# Patient Record
Sex: Male | Born: 1965 | Race: Black or African American | Hispanic: No | Marital: Married | Smoking: Never smoker
Health system: Southern US, Community
[De-identification: ages and names within clinical notes are randomized; demographics above are authoritative.]

## PROBLEM LIST (undated history)

## (undated) DIAGNOSIS — F419 Anxiety disorder, unspecified: Secondary | ICD-10-CM

## (undated) DIAGNOSIS — I10 Essential (primary) hypertension: Secondary | ICD-10-CM

## (undated) DIAGNOSIS — M1990 Unspecified osteoarthritis, unspecified site: Secondary | ICD-10-CM

## (undated) HISTORY — DX: Unspecified osteoarthritis, unspecified site: M19.90

## (undated) HISTORY — DX: Anxiety disorder, unspecified: F41.9

## (undated) HISTORY — DX: Essential (primary) hypertension: I10

---

## 2011-12-12 ENCOUNTER — Ambulatory Visit (INDEPENDENT_AMBULATORY_CARE_PROVIDER_SITE_OTHER): Payer: BC Managed Care – PPO | Admitting: Family Medicine

## 2011-12-12 VITALS — BP 184/98 | HR 91 | Temp 98.7°F | Resp 20 | Ht 69.75 in | Wt 200.4 lb

## 2011-12-12 DIAGNOSIS — R51 Headache: Secondary | ICD-10-CM

## 2011-12-12 DIAGNOSIS — F419 Anxiety disorder, unspecified: Secondary | ICD-10-CM | POA: Insufficient documentation

## 2011-12-12 DIAGNOSIS — F411 Generalized anxiety disorder: Secondary | ICD-10-CM

## 2011-12-12 DIAGNOSIS — G4726 Circadian rhythm sleep disorder, shift work type: Secondary | ICD-10-CM

## 2011-12-12 DIAGNOSIS — Z23 Encounter for immunization: Secondary | ICD-10-CM

## 2011-12-12 DIAGNOSIS — Z79899 Other long term (current) drug therapy: Secondary | ICD-10-CM

## 2011-12-12 DIAGNOSIS — I10 Essential (primary) hypertension: Secondary | ICD-10-CM

## 2011-12-12 DIAGNOSIS — R519 Headache, unspecified: Secondary | ICD-10-CM | POA: Insufficient documentation

## 2011-12-12 LAB — COMPREHENSIVE METABOLIC PANEL
ALT: 22 U/L (ref 0–53)
AST: 19 U/L (ref 0–37)
Alkaline Phosphatase: 57 U/L (ref 39–117)
Potassium: 4.2 mEq/L (ref 3.5–5.3)
Sodium: 141 mEq/L (ref 135–145)
Total Bilirubin: 0.4 mg/dL (ref 0.3–1.2)
Total Protein: 7.2 g/dL (ref 6.0–8.3)

## 2011-12-12 MED ORDER — PROPRANOLOL HCL ER 80 MG PO CP24: 80.0000 mg | ORAL_CAPSULE | Freq: Every day | ORAL | Status: DC

## 2011-12-12 MED ORDER — TRAZODONE HCL 50 MG PO TABS: 50.0000 mg | ORAL_TABLET | Freq: Every evening | ORAL | Status: DC | PRN

## 2011-12-12 MED ORDER — LISINOPRIL-HYDROCHLOROTHIAZIDE 20-25 MG PO TABS: 1.0000 | ORAL_TABLET | Freq: Every day | ORAL | Status: DC

## 2011-12-12 NOTE — Progress Notes (Signed)
  Subjective:    Patient ID: Kevin Jarvis, male    DOB: May 04, 1965, 46 y.o.   MRN: [id number]  HPI presents today with HTN. BP reading today 184/98. Dx 4-5 years ago. HTN is causing mild headaches on top of head. Has been using OTC medications but that doesn't help. Was on Lisinopril-HCTZ but just got tired of taking medication so stopped over a yr ago and didn't feel his bp was ever controlled on it. Repeat manual BP reading 168/92 in office today. Denies CP, SOB, or edema. Had eye exam earlier this year and wears reading glasses.  Works third shift and having trouble sleeping during the day.   Anxiety at times. Not depressed - mood good and really doesn't have anything to worry about - things going well in his life.    Review of Systems  Constitutional: Positive for diaphoresis and fatigue. Negative for fever, chills and appetite change.  Eyes: Negative for visual disturbance.  Respiratory: Negative for chest tightness and shortness of breath.   Cardiovascular: Negative for chest pain and leg swelling.  Neurological: Positive for headaches.  Psychiatric/Behavioral: Positive for disturbed wake/sleep cycle. Negative for behavioral problems and dysphoric mood. The patient is nervous/anxious.        Objective:   Physical Exam  Constitutional: He is oriented to person, place, and time. He appears well-developed and well-nourished.  HENT:  Head: Normocephalic and atraumatic.  Eyes: Pupils are equal, round, and reactive to light. No scleral icterus.  Neck: Normal range of motion. Neck supple. No thyromegaly present.  Cardiovascular: Normal rate, regular rhythm, normal heart sounds and intact distal pulses.   Pulmonary/Chest: Effort normal and breath sounds normal. No respiratory distress.  Neurological: He is alert and oriented to person, place, and time.  Skin: Skin is warm and dry.  Psychiatric: He has a normal mood and affect. His behavior is normal.          Assessment & Plan:    1. HTN - restart lisinopril/hctz 20/25. Start propranolol since hopefully this will help w/ anxiety as well. Recheck in 1-2 wks w/ repeat bmp. Check cmp today since starting meds. Needs flp at f/u. 2. HAs - fine to try prn anti-inflammatories but hopefully will go away once bp treated. Increase h20. Check tsh 3. Insomnia - trial of prn trazodone. Consider re-trial of ambien if unsuccessful. 4. HM - flu shot today

## 2011-12-12 NOTE — Patient Instructions (Addendum)
Take the trazodone as needed for sleep. Consider augmenting or replacing with otc melatonin 3 mg if needed for sleep as well.  Return to see me in 1-2 wks to recheck your blood pressure and your blood work. Do not eat before you visit though you may drink plenty of water and do take all of your medicines that day.Hypertension As your heart beats, it forces blood through your arteries. This force is your blood pressure. If the pressure is too high, it is called hypertension (HTN) or high blood pressure. HTN is dangerous because you may have it and not know it. High blood pressure may mean that your heart has to work harder to pump blood. Your arteries may be narrow or stiff. The extra work puts you at risk for heart disease, stroke, and other problems.  Blood pressure consists of two numbers, a higher number over a lower, 110/72, for example. It is stated as "110 over 72." The ideal is below 120 for the top number (systolic) and under 80 for the bottom (diastolic). Write down your blood pressure today. You should pay close attention to your blood pressure if you have certain conditions such as:  Heart failure.   Prior heart attack.   Diabetes   Chronic kidney disease.   Prior stroke.   Multiple risk factors for heart disease.  To see if you have HTN, your blood pressure should be measured while you are seated with your arm held at the level of the heart. It should be measured at least twice. A one-time elevated blood pressure reading (especially in the Emergency Department) does not mean that you need treatment. There may be conditions in which the blood pressure is different between your right and left arms. It is important to see your caregiver soon for a recheck. Most people have essential hypertension which means that there is not a specific cause. This type of high blood pressure may be lowered by changing lifestyle factors such as:  Stress.   Smoking.   Lack of exercise.   Excessive  weight.   Drug/tobacco/alcohol use.   Eating less salt.  Most people do not have symptoms from high blood pressure until it has caused damage to the body. Effective treatment can often prevent, delay or reduce that damage. TREATMENT  When a cause has been identified, treatment for high blood pressure is directed at the cause. There are a large number of medications to treat HTN. These fall into several categories, and your caregiver will help you select the medicines that are best for you. Medications may have side effects. You should review side effects with your caregiver. If your blood pressure stays high after you have made lifestyle changes or started on medicines,   Your medication(s) may need to be changed.   Other problems may need to be addressed.   Be certain you understand your prescriptions, and know how and when to take your medicine.   Be sure to follow up with your caregiver within the time frame advised (usually within two weeks) to have your blood pressure rechecked and to review your medications.   If you are taking more than one medicine to lower your blood pressure, make sure you know how and at what times they should be taken. Taking two medicines at the same time can result in blood pressure that is too low.  SEEK IMMEDIATE MEDICAL CARE IF:  You develop a severe headache, blurred or changing vision, or confusion.   You have unusual  weakness or numbness, or a faint feeling.   You have severe chest or abdominal pain, vomiting, or breathing problems.  MAKE SURE YOU:   Understand these instructions.   Will watch your condition.   Will get help right away if you are not doing well or get worse.  Document Released: 03/18/2005 Document Revised: 03/07/2011 Document Reviewed: 11/06/2007 [facility] Patient Information 2012 [place], [place].

## 2011-12-12 NOTE — Progress Notes (Signed)
Reviewed and agree.

## 2011-12-17 ENCOUNTER — Other Ambulatory Visit: Payer: Self-pay | Admitting: Family Medicine

## 2011-12-17 DIAGNOSIS — R946 Abnormal results of thyroid function studies: Secondary | ICD-10-CM

## 2011-12-23 ENCOUNTER — Ambulatory Visit (INDEPENDENT_AMBULATORY_CARE_PROVIDER_SITE_OTHER): Payer: BC Managed Care – PPO | Admitting: Family Medicine

## 2011-12-23 VITALS — BP 125/80 | HR 63 | Temp 98.3°F | Resp 18 | Ht 70.0 in | Wt 203.0 lb

## 2011-12-23 DIAGNOSIS — G4700 Insomnia, unspecified: Secondary | ICD-10-CM

## 2011-12-23 DIAGNOSIS — F419 Anxiety disorder, unspecified: Secondary | ICD-10-CM

## 2011-12-23 DIAGNOSIS — I10 Essential (primary) hypertension: Secondary | ICD-10-CM

## 2011-12-23 DIAGNOSIS — F411 Generalized anxiety disorder: Secondary | ICD-10-CM

## 2011-12-23 LAB — LIPID PANEL: Total CHOL/HDL Ratio: 2.8 Ratio

## 2011-12-23 LAB — BASIC METABOLIC PANEL
BUN: 13 mg/dL (ref 6–23)
CO2: 28 mEq/L (ref 19–32)
Calcium: 9.7 mg/dL (ref 8.4–10.5)
Creat: 0.93 mg/dL (ref 0.50–1.35)
Glucose, Bld: 102 mg/dL — ABNORMAL HIGH (ref 70–99)
Sodium: 138 mEq/L (ref 135–145)

## 2011-12-23 LAB — TSH: TSH: 0.449 u[IU]/mL (ref 0.350–4.500)

## 2011-12-23 NOTE — Progress Notes (Signed)
[facility] [address] [place] [address] [phone]  Date:  12/23/2011   Name:  Kevin Jarvis   DOB:  1965/06/11   MRN:  [id number]  PCP:  No primary provider on file.    Chief Complaint: Follow-up and insomnia   History of Present Illness:  Kevin Jarvis is a 46 y.o. very pleasant male patient who presents with the following:  He is here today for a recheck of his HTN, anxiety, and insomnia.  See last OV about 10 days ago.  He was started back on anti- hypertensive medications then.   He feels that his job is more stressful- after a shift at work he has a hard time sleeping, but when he has not been at work he sleeps well.  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    Kyshaun would like a note to return to being a Location manager.    He is fasting today for a cholesterol panel- he last ate at 11 pm   He does not feel that the trazadone is very helpful for sleeping, but he thinks the problem is more his current job.    He was NOT fasting at his last lab draw.  He had a very slightly low TSH- will recheck this today.   Patient Active Problem List  Diagnosis  . Anxiety  . Long term use of drug  . Hypertension  . Sleep disorder, shift work  . Headache    No past medical history on file.  No past surgical history on file.  History  Substance Use Topics  . Smoking status: Never Smoker   . Smokeless tobacco: Not on file  . Alcohol Use: Not on file    No family history on file.  No Known Allergies  Medication list has been reviewed and updated.  Current Outpatient Prescriptions on File Prior to Visit  Medication Sig Dispense Refill  . lisinopril-hydrochlorothiazide (PRINZIDE,ZESTORETIC) 20-25 MG per tablet Take 1 tablet by mouth daily.  30 tablet  1  . propranolol ER (INDERAL LA) 80 MG 24 hr  capsule Take 1 capsule (80 mg total) by mouth daily.  30 capsule  1  . traZODone (DESYREL) 50 MG tablet Take 1 tablet (50 mg total) by mouth at bedtime as needed for sleep.  90 tablet  1    Review of Systems:  As per HPI- otherwise negative.   Physical Examination: Filed Vitals:   12/23/11 0806  BP: 152/96  Pulse: 63  Temp: 98.3 F (36.8 C)  Resp: 18   Filed Vitals:   12/23/11 0806  Height: 5\' 10"  (1.778 m)  Weight: 203 lb (92.08 kg)   Body mass index is 29.13 kg/(m^2). Ideal Body Weight: Weight in (lb) to have BMI = 25: 173.9   GEN: WDWN, NAD, Non-toxic, A & O x 3 HEENT: Atraumatic, Normocephalic. Neck supple. No masses, No LAD. Ears and Nose: No external deformity. CV: RRR, No M/G/R. No JVD. No thrill. No extra heart sounds. PULM: CTA B, no wheezes, crackles, rhonchi. No retractions. No resp. distress. No accessory muscle use. ABD: S, NT, ND, +BS. No rebound. No HSM. EXTR: No c/c/e NEURO Normal gait.  PSYCH: Normally interactive. Conversant. Not depressed or anxious appearing.  Calm demeanor.   See recheck BP- 125/ 80!   Assessment and Plan:  1. Hypertension  Basic metabolic panel, Lipid panel, TSH  2. Anxiety  TSH  3. Insomnia     HTN- controlled!  Check BMP today, but plan to continue current medications. 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  He felt comfortable nad confident in that position.  I did a letter for him today Recheck TSH as it was slightly low last time.  Check FLP today as he is fasting  COPLAND,JESSICA, MD

## 2011-12-24 ENCOUNTER — Encounter: Payer: Self-pay | Admitting: Family Medicine

## 2012-02-20 ENCOUNTER — Other Ambulatory Visit: Payer: Self-pay | Admitting: *Deleted

## 2012-02-20 MED ORDER — PROPRANOLOL HCL ER 80 MG PO CP24: 80.0000 mg | ORAL_CAPSULE | Freq: Every day | ORAL | Status: DC

## 2012-04-25 ENCOUNTER — Ambulatory Visit (INDEPENDENT_AMBULATORY_CARE_PROVIDER_SITE_OTHER): Payer: BC Managed Care – PPO | Admitting: Family Medicine

## 2012-04-25 VITALS — BP 180/110 | HR 81 | Temp 98.4°F | Resp 18 | Wt 205.0 lb

## 2012-04-25 DIAGNOSIS — I10 Essential (primary) hypertension: Secondary | ICD-10-CM

## 2012-04-25 DIAGNOSIS — R51 Headache: Secondary | ICD-10-CM

## 2012-04-25 DIAGNOSIS — R251 Tremor, unspecified: Secondary | ICD-10-CM

## 2012-04-25 DIAGNOSIS — R259 Unspecified abnormal involuntary movements: Secondary | ICD-10-CM

## 2012-04-25 MED ORDER — KETOROLAC TROMETHAMINE 60 MG/2ML IM SOLN
60.0000 mg | Freq: Once | INTRAMUSCULAR | Status: AC
  Administered 2012-04-25: 60 mg via INTRAMUSCULAR

## 2012-04-25 MED ORDER — BUTALBITAL-APAP-CAFFEINE 50-325-40 MG PO TABS: 1.0000 | ORAL_TABLET | ORAL | Status: AC | PRN

## 2012-04-25 MED ORDER — LISINOPRIL-HYDROCHLOROTHIAZIDE 20-25 MG PO TABS: 1.0000 | ORAL_TABLET | Freq: Every day | ORAL | Status: DC

## 2012-04-25 MED ORDER — PROPRANOLOL HCL ER 80 MG PO CP24: 80.0000 mg | ORAL_CAPSULE | Freq: Every day | ORAL | Status: DC

## 2012-04-25 MED ORDER — CLONIDINE HCL 0.1 MG PO TABS
0.1000 mg | ORAL_TABLET | Freq: Once | ORAL | Status: AC
  Administered 2012-04-25: 0.1 mg via ORAL

## 2012-04-29 ENCOUNTER — Telehealth: Payer: Self-pay

## 2012-04-29 NOTE — Telephone Encounter (Signed)
Spoke with pt. Notified of labs and says his head is much better

## 2012-05-03 NOTE — Progress Notes (Signed)
Subjective:    Patient ID: Kevin Jarvis, male    DOB: October 06, 1965, 47 y.o.   MRN: [id number] Chief Complaint  Patient presents with  . Headache    hypertension    HPI  He ran out of his BP medication about 2 wks ago and since then has been developing a progressively worse HA. He has not taken anything for his HA today but usually ibuprofen and tylenol help some - just not all the way. The HA is at the top of his head. He often gets this when he runs out of his BP meds.  He is planning a 3 mo trip out of the country around April but will make sure to RTC before then.  Review of Systems  Constitutional: Negative for fever and chills.  Eyes: Negative for visual disturbance.  Respiratory: Negative for shortness of breath.   Cardiovascular: Negative for chest pain and leg swelling.  Neurological: Positive for headaches. Negative for dizziness, syncope, facial asymmetry, weakness and light-headedness.      BP 180/110  Pulse 81  Temp 98.4 F (36.9 C) (Oral)  Resp 18  Wt 205 lb (92.987 kg) Objective:   Physical Exam  Constitutional: He is oriented to person, place, and time. He appears well-developed and well-nourished. No distress.  HENT:  Head: Normocephalic and atraumatic.  Right Ear: Tympanic membrane, external ear and ear canal normal.  Left Ear: Tympanic membrane, external ear and ear canal normal.  Nose: Nose normal.  Mouth/Throat: Uvula is midline, oropharynx is clear and moist and mucous membranes are normal. No oropharyngeal exudate.  Eyes: Conjunctivae normal and EOM are normal. Pupils are equal, round, and reactive to light. No scleral icterus.  Neck: Normal range of motion. Neck supple. No thyromegaly present.  Pulmonary/Chest: Effort normal.  Lymphadenopathy:    He has no cervical adenopathy.  Neurological: He is alert and oriented to person, place, and time. He displays normal reflexes. No cranial nerve deficit or sensory deficit. He exhibits normal muscle tone.  Coordination and gait normal.  Skin: Skin is warm and dry. He is not diaphoretic.  Psychiatric: He has a normal mood and affect. His behavior is normal.          Assessment & Plan:   1. Hypertension - given clonidine 0.1mg  po x 1 in office.  Restart lisinopril-hctz and propranolol as was prev under excellent control. Pt promises he will f/u in 3 mos BEFORE he runs out of the medication. lisinopril-hydrochlorothiazide (PRINZIDE,ZESTORETIC) 20-25 MG per tablet, propranolol ER (INDERAL LA) 80 MG 24 hr capsule, cloNIDine (CATAPRES) tablet 0.1 mg  2. Headache - treated with toradol 60mg  IM x 1 in office. Suspect will go away once he gets restarted on BP meds but may also have a component of tension so ok to try a few fioricet while restarting on bp meds. ketorolac (TORADOL) injection 60 mg, butalbital-acetaminophen-caffeine (FIORICET) 50-325-40 MG per tablet  3. Tremor - restart propranolol TSH   Meds ordered this encounter  Medications  . lisinopril-hydrochlorothiazide (PRINZIDE,ZESTORETIC) 20-25 MG per tablet    Sig: Take 1 tablet by mouth daily.    Dispense:  30 tablet    Refill:  2  . propranolol ER (INDERAL LA) 80 MG 24 hr capsule    Sig: Take 1 capsule (80 mg total) by mouth daily.    Dispense:  30 capsule    Refill:  2  . cloNIDine (CATAPRES) tablet 0.1 mg    Sig:   . ketorolac (TORADOL) injection 60 mg  Sig:   . butalbital-acetaminophen-caffeine (FIORICET) 50-325-40 MG per tablet    Sig: Take 1 tablet by mouth every 4 (four) hours as needed for headache.    Dispense:  10 tablet    Refill:  0

## 2012-07-01 ENCOUNTER — Ambulatory Visit (INDEPENDENT_AMBULATORY_CARE_PROVIDER_SITE_OTHER): Payer: BC Managed Care – PPO | Admitting: Family Medicine

## 2012-07-01 VITALS — BP 178/90 | HR 70 | Temp 97.7°F | Resp 16 | Ht 69.5 in | Wt 205.0 lb

## 2012-07-01 DIAGNOSIS — Z7184 Encounter for health counseling related to travel: Secondary | ICD-10-CM

## 2012-07-01 DIAGNOSIS — G4726 Circadian rhythm sleep disorder, shift work type: Secondary | ICD-10-CM

## 2012-07-01 DIAGNOSIS — I10 Essential (primary) hypertension: Secondary | ICD-10-CM

## 2012-07-01 DIAGNOSIS — Z7189 Other specified counseling: Secondary | ICD-10-CM

## 2012-07-01 DIAGNOSIS — Z79899 Other long term (current) drug therapy: Secondary | ICD-10-CM

## 2012-07-01 MED ORDER — ATOVAQUONE-PROGUANIL HCL 250-100 MG PO TABS: 1.0000 | ORAL_TABLET | Freq: Every day | ORAL | Status: DC

## 2012-07-01 MED ORDER — PROPRANOLOL HCL ER 80 MG PO CP24: 80.0000 mg | ORAL_CAPSULE | Freq: Every day | ORAL | Status: DC

## 2012-07-01 MED ORDER — LISINOPRIL-HYDROCHLOROTHIAZIDE 20-25 MG PO TABS: 1.0000 | ORAL_TABLET | Freq: Every day | ORAL | Status: DC

## 2012-07-01 NOTE — Progress Notes (Signed)
  Subjective:    Patient ID: Kevin Jarvis, male    DOB: 10-22-1965, 47 y.o.   MRN: [id number] Chief Complaint  Patient presents with  . Medication Refill    HPI   Works 3rd shift and works constantly. - Usually 6 nights a wk. Has been taking his blood pressure pills every morning after he gets home from work, before bed.  He has not had any time to check his BP outside of the office. He ran out of BP pills yest so has not had any in >24 hrs.  He does not have a headache.  In 3d, he is leaving to visit family in [place] whom he has not seen in 2 yrs. He plans to stay about 3 months.   Review of Systems  Constitutional: Positive for fatigue. Negative for fever, chills, activity change and appetite change.  Eyes: Negative for visual disturbance.  Respiratory: Negative for shortness of breath.   Cardiovascular: Negative for chest pain and leg swelling.  Neurological: Negative for dizziness, syncope, facial asymmetry, weakness, light-headedness and headaches.  Psychiatric/Behavioral: Positive for sleep disturbance.      BP 178/90  Pulse 70  Temp(Src) 97.7 F (36.5 C) (Oral)  Resp 16  Ht 5' 9.5" (1.765 m)  Wt 205 lb (92.987 kg)  BMI 29.85 kg/m2  SpO2 99% Objective:   Physical Exam  Constitutional: He is oriented to person, place, and time. He appears well-developed and well-nourished. No distress.  HENT:  Head: Normocephalic and atraumatic.  Eyes: Conjunctivae are normal. Pupils are equal, round, and reactive to light. No scleral icterus.  Neck: Normal range of motion. Neck supple. No thyromegaly present.  Cardiovascular: Normal rate, regular rhythm, normal heart sounds and intact distal pulses.   Pulmonary/Chest: Effort normal and breath sounds normal. No respiratory distress.  Musculoskeletal: He exhibits no edema.  Lymphadenopathy:    He has no cervical adenopathy.  Neurological: He is alert and oriented to person, place, and time.  Skin: Skin is warm and dry. He is not  diaphoretic.  Psychiatric: He has a normal mood and affect. His behavior is normal.      Assessment & Plan:  Long term use of drug - bmp at f/u  Hypertension - Plan: propranolol ER (INDERAL LA) 80 MG 24 hr capsule, lisinopril-hydrochlorothiazide (PRINZIDE,ZESTORETIC) 20-25 MG per tablet - restart. Plenty given so that pt can be ON his medicine when he returns to clinic next.  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  Meds ordered this encounter  Medications  . propranolol ER (INDERAL LA) 80 MG 24 hr capsule    Sig: Take 1 capsule (80 mg total) by mouth daily.    Dispense:  180 capsule    Refill:  0    Please dispense a 6 mo supply. Pt is leaving the country.  Marland Kitchen lisinopril-hydrochlorothiazide (PRINZIDE,ZESTORETIC) 20-25 MG per tablet    Sig: Take 1 tablet by mouth daily.    Dispense:  180 tablet    Refill:  0    Please dispense 6 mo supply. Pt is leaving the country  . atovaquone-proguanil (MALARONE) 250-100 MG TABS    Sig: Take 1 tablet by mouth daily. Start  07/02/12 and d/c 7d after returning    Dispense:  120 tablet    Refill:  0

## 2013-08-05 ENCOUNTER — Telehealth: Payer: Self-pay | Admitting: Family Medicine

## 2013-08-05 ENCOUNTER — Other Ambulatory Visit: Payer: Self-pay | Admitting: Family Medicine

## 2013-08-05 ENCOUNTER — Ambulatory Visit (INDEPENDENT_AMBULATORY_CARE_PROVIDER_SITE_OTHER): Payer: 59 | Admitting: Family Medicine

## 2013-08-05 VITALS — BP 162/80 | HR 85 | Temp 98.3°F | Resp 18 | Ht 68.5 in | Wt 202.0 lb

## 2013-08-05 DIAGNOSIS — N529 Male erectile dysfunction, unspecified: Secondary | ICD-10-CM

## 2013-08-05 DIAGNOSIS — N50819 Testicular pain, unspecified: Secondary | ICD-10-CM

## 2013-08-05 DIAGNOSIS — Z0000 Encounter for general adult medical examination without abnormal findings: Secondary | ICD-10-CM

## 2013-08-05 DIAGNOSIS — Z23 Encounter for immunization: Secondary | ICD-10-CM

## 2013-08-05 DIAGNOSIS — F411 Generalized anxiety disorder: Secondary | ICD-10-CM

## 2013-08-05 DIAGNOSIS — I10 Essential (primary) hypertension: Secondary | ICD-10-CM

## 2013-08-05 DIAGNOSIS — N509 Disorder of male genital organs, unspecified: Secondary | ICD-10-CM

## 2013-08-05 MED ORDER — TRAZODONE HCL 50 MG PO TABS: 25.0000 mg | ORAL_TABLET | Freq: Every evening | ORAL | Status: DC | PRN

## 2013-08-05 MED ORDER — SILDENAFIL CITRATE 100 MG PO TABS: 50.0000 mg | ORAL_TABLET | Freq: Every day | ORAL | Status: DC | PRN

## 2013-08-05 MED ORDER — PROPRANOLOL HCL ER 80 MG PO CP24: 80.0000 mg | ORAL_CAPSULE | Freq: Every day | ORAL | Status: DC

## 2013-08-05 MED ORDER — LISINOPRIL-HYDROCHLOROTHIAZIDE 20-25 MG PO TABS: 1.0000 | ORAL_TABLET | Freq: Every day | ORAL | Status: DC

## 2013-08-05 NOTE — Progress Notes (Addendum)
Subjective:    Patient ID: Kevin Jarvis, male    DOB: Aug 23, 1965, 48 y.o.   MRN: [id number]  Chief Complaint  Patient presents with  . Annual Exam   This chart was scribed for [name], MD by Erling Conte, Medical Scribe. This patient was seen in room Room 4 and the patient's care was started at 10:48 AM   HPI  HPI Comments: Kevin Jarvis is a 48 y.o. male who presents [facility] for his [facility] exam.    Patient states that he needs refill for his anxiety medication.  He was on propranolol  Patient also reports that he is on medication for his HTN that he got in [place] when he was there on a visit for 2-3 wks. Med name or type unknown. Patient denies any chest pain, dizziness, dyspnea.   He also got married his recent trip back home to [place].  Patient reports that he is having some mild erectile dysfunction that he would like treated before his wife moves here from [place] so no current need for viagra. He also has some bilateral testicular pain.   Patient does not believe that he has had his T-dap shot in the last 10 years.   He asked for a medication to help with his sleep, due to his shift work. Tried trazodone previously.  Past Medical History  Diagnosis Date  . Arthritis   . Anxiety   . Hypertension    Current Outpatient Prescriptions on File Prior to Visit  Medication Sig Dispense Refill  . atovaquone-proguanil (MALARONE) 250-100 MG TABS Take 1 tablet by mouth daily. Start  07/02/12 and d/c 7d after returning  120 tablet  0  . lisinopril-hydrochlorothiazide (PRINZIDE,ZESTORETIC) 20-25 MG per tablet Take 1 tablet by mouth daily.  180 tablet  0  . propranolol ER (INDERAL LA) 80 MG 24 hr capsule Take 1 capsule (80 mg total) by mouth daily.  180 capsule  0   No current facility-administered medications on file prior to visit.   No Known Allergies  Review of Systems  Respiratory: Negative for shortness of breath.   Cardiovascular: Negative  for chest pain.  Genitourinary: Positive for discharge.       Mild erectile dysfunction  Neurological: Negative for dizziness.  All other systems reviewed and are negative.     BP 162/80  Pulse 85  Temp(Src) 98.3 F (36.8 C) (Oral)  Resp 18  Ht 5' 8.5" (1.74 m)  Wt 202 lb (91.627 kg)  BMI 30.26 kg/m2  SpO2 99% Objective:   Physical Exam  Nursing note and vitals reviewed. Constitutional: He is oriented to person, place, and time. He appears well-developed and well-nourished. No distress.  HENT:  Head: Normocephalic and atraumatic.  Right Ear: Tympanic membrane, external ear and ear canal normal.  Left Ear: Tympanic membrane, external ear and ear canal normal.  Nose: Nose normal.  Mouth/Throat: Uvula is midline, oropharynx is clear and moist and mucous membranes are normal. No oropharyngeal exudate.  Eyes: Conjunctivae and EOM are normal. Right eye exhibits no discharge. Left eye exhibits no discharge. No scleral icterus.  Neck: Normal range of motion. Neck supple. No tracheal deviation present. No thyromegaly present.  Cardiovascular: Normal rate, regular rhythm, normal heart sounds and intact distal pulses.  Exam reveals no friction rub.   No murmur heard. Pulses:      Dorsalis pedis pulses are 2+ on the right side, and 2+ on the left side.  Posterior tibial pulses are 2+ on the right side, and 2+ on the left side.  Pulmonary/Chest: Effort normal and breath sounds normal. No respiratory distress.  Abdominal: Soft. Bowel sounds are normal. He exhibits no distension and no mass. There is no hepatosplenomegaly. There is no tenderness. There is no rebound and no guarding.  Genitourinary: Rectum normal, prostate normal and penis normal. Right testis shows tenderness. Right testis shows no mass and no swelling. Left testis shows tenderness. Uncircumcised. No penile erythema. No discharge found.  Musculoskeletal: Normal range of motion. He exhibits no edema.  1+ pitting edema  bilaterally with some shiny hairless skin on anterior lower tibia  Lymphadenopathy:    He has no cervical adenopathy.       Right: No supraclavicular adenopathy present.       Left: No supraclavicular adenopathy present.  Neurological: He is alert and oriented to person, place, and time. He has normal reflexes. No cranial nerve deficit. He exhibits normal muscle tone.  Skin: Skin is warm and dry. No rash noted. He is not diaphoretic. No erythema.  Psychiatric: He has a normal mood and affect. His behavior is normal.     [facility] reading (PRIMARY) by Dr. Brigitte Pulse EKG: T wave inversion V4-V6, increased R wave in lead 3 and AVF    Assessment & Plan:  Routine general medical examination at a health care facility - Plan: EKG 12-Lead, HIV antibody, RPR, GC/Chlamydia Probe Amp, Hepatitis C antibody, Lipid panel, PSA, CBC with Differential, COMPLETE METABOLIC PANEL WITH GFR, TSH - Encounter limited by language barrier  Essential hypertension, benign - Plan: EKG 12-Lead, CBC with Differential, COMPLETE METABOLIC PANEL WITH GFR - restart lisinopril-hctz and propranolol. Recheck in 2 mos.  Erectile dysfunction - Plan: US Scrotum - unclear but pt does not desire treatment now - his wife is still in [place] and will not be joining him until much later in the year. Viagra coupon given to pt to use at that time.  Testicular pain - Plan: US Scrotum  Anxiety state, unspecified and shift-work sleep d/o - refilled propranolol trazodone per pt request.  Hypertension - Plan: lisinopril-hydrochlorothiazide (PRINZIDE,ZESTORETIC) 20-25 MG per tablet, propranolol ER (INDERAL LA) 80 MG 24 hr capsule  Need for Tdap vaccination - Plan: Tdap vaccine greater than or equal to 7yo IM  Meds ordered this encounter  Medications  . lisinopril-hydrochlorothiazide (PRINZIDE,ZESTORETIC) 20-25 MG per tablet    Sig: Take 1 tablet by mouth daily.    Dispense:  90 tablet    Refill:  0  . propranolol ER (INDERAL LA) 80 MG 24 hr  capsule    Sig: Take 1 capsule (80 mg total) by mouth daily.    Dispense:  90 capsule    Refill:  0  . DISCONTD: sildenafil (VIAGRA) 100 MG tablet    Sig: Take 0.5 tablets (50 mg total) by mouth daily as needed for erectile dysfunction.    Dispense:  5 tablet    Refill:  11  . traZODone (DESYREL) 50 MG tablet    Sig: Take 0.5-1 tablets (25-50 mg total) by mouth at bedtime as needed for sleep.    Dispense:  30 tablet    Refill:  3    I personally performed the services described in this documentation, which was scribed in my presence. The recorded information has been reviewed and considered, and addended by me as needed.  [name], MD MPH

## 2013-08-05 NOTE — Patient Instructions (Addendum)
Keeping you healthy  Get these tests  Blood pressure- Have your blood pressure checked once a year by your healthcare provider.  Normal blood pressure is 120/80  Weight- Have your body mass index (BMI) calculated to screen for obesity.  BMI is a measure of body fat based on height and weight. You can also calculate your own BMI at [email or web address].  Cholesterol- Have your cholesterol checked every year.  Diabetes- Have your blood sugar checked regularly if you have high blood pressure, high cholesterol, have a family history of diabetes or if you are overweight.  Screening for Colon Cancer- Colonoscopy starting at age 15.  Screening may begin sooner depending on your family history and other health conditions. Follow up colonoscopy as directed by your Gastroenterologist.  Screening for Prostate Cancer- Both blood work (PSA) and a rectal exam help screen for Prostate Cancer.  Screening begins at age 60 with African-American men and at age 61 with Caucasian men.  Screening may begin sooner depending on your family history.  Take these medicines  Aspirin- One aspirin daily can help prevent Heart disease and Stroke.  Flu shot- Every fall.  Tetanus- Every 10 years.  Zostavax- Once after the age of 9 to prevent Shingles.  Pneumonia shot- Once after the age of 79; if you are younger than 70, ask your healthcare provider if you need a Pneumonia shot.  Take these steps  Don't smoke- If you do smoke, talk to your doctor about quitting.  For tips on how to quit, go to www.smokefree.gov or call 1-800-QUIT-NOW.  Be physically active- Exercise 5 days a week for at least 30 minutes.  If you are not already physically active start slow and gradually work up to 30 minutes of moderate physical activity.  Examples of moderate activity include walking briskly, mowing the yard, dancing, swimming, bicycling, etc.  Eat a healthy diet- Eat a variety of healthy food such as fruits, vegetables, low  fat milk, low fat cheese, yogurt, lean meant, poultry, fish, beans, tofu, etc. For more information go to www.thenutritionsource.org  Drink alcohol in moderation- Limit alcohol intake to less than two drinks a day. Never drink and drive.  Dentist- Brush and floss twice daily; visit your dentist twice a year.  Depression- Your emotional health is as important as your physical health. If you're feeling down, or losing interest in things you would normally enjoy please talk to your healthcare provider.  Eye exam- Visit your eye doctor every year.  Safe sex- If you may be exposed to a sexually transmitted infection, use a condom.  Seat belts- Seat belts can save your life; always wear one.  Smoke/Carbon Monoxide detectors- These detectors need to be installed on the appropriate level of your home.  Replace batteries at least once a year.  Skin cancer- When out in the sun, cover up and use sunscreen 15 SPF or higher.  Violence- If anyone is threatening you, please tell your healthcare provider.  Living Will/ Health care power of attorney- Speak with your healthcare provider and family.  Erectile Dysfunction Erectile dysfunction is the inability to get or sustain a good enough erection to have sexual intercourse. Erectile dysfunction may involve:  Inability to get an erection.  Lack of enough hardness to allow penetration.  Loss of the erection before sex is finished.  Premature ejaculation. CAUSES  Certain drugs, such as:  Pain relievers.  Antihistamines.  Antidepressants.  Blood pressure medicines.  Water pills (diuretics).  Ulcer medicines.  Muscle relaxants.  Illegal  drugs.  Excessive drinking.  Psychological causes, such as:  Anxiety.  Depression.  Sadness.  Exhaustion.  Performance fear.  Stress.  Physical causes, such as:  Artery problems. This may include diabetes, smoking, liver disease, or atherosclerosis.  High blood pressure.  Hormonal  problems, such as low testosterone.  Obesity.  Nerve problems. This may include back or pelvic injuries, diabetes mellitus, multiple sclerosis, or Parkinson disease. SYMPTOMS  Inability to get an erection.  Lack of enough hardness to allow penetration.  Loss of the erection before sex is finished.  Premature ejaculation.  Normal erections at some times, but with frequent unsatisfactory episodes.  Orgasms that are not satisfactory in sensation or frequency.  Low sexual satisfaction in either partner because of erection problems.  A curved penis occurring with erection. The curve may cause pain or may be too curved to allow for intercourse.  Never having nighttime erections. DIAGNOSIS Your caregiver can often diagnose this condition by:  Performing a physical exam to find other diseases or specific problems with the penis.  Asking you detailed questions about the problem.  Performing blood tests to check for diabetes mellitus or to measure hormone levels.  Performing urine tests to find other underlying health conditions.  Performing an ultrasound exam to check for scarring.  Performing a test to check blood flow to the penis.  Doing a sleep study at home to measure nighttime erections. TREATMENT   You may be prescribed medicines by mouth.  You may be given medicine injections into the penis.  You may be prescribed a vacuum pump with a ring.  Penile implant surgery may be performed. You may receive:  An inflatable implant.  A semirigid implant.  Blood vessel surgery may be performed. HOME CARE INSTRUCTIONS  If you are prescribed oral medicine, you should take the medicine as prescribed. Do not increase the dosage without first discussing it with your physician.  If you are using self-injections, be careful to avoid any veins that are on the surface of the penis. Apply pressure to the injection site for 5 minutes.  If you are using a vacuum pump, make sure  you have read the instructions before using it. Discuss any questions with your physician before taking the pump home. SEEK MEDICAL CARE IF:  You experience pain that is not responsive to the pain medicine you have been prescribed.  You experience nausea or vomiting. SEEK IMMEDIATE MEDICAL CARE IF:   When taking oral or injectable medications, you experience an erection that lasts longer than 4 hours. If your physician is unavailable, go to the nearest emergency room for evaluation. An erection that lasts much longer than 4 hours can result in permanent damage to your penis.  You have pain that is severe.  You develop redness, severe pain, or severe swelling of your penis.  You have redness spreading up into your groin or lower abdomen.  You are unable to pass your urine. Document Released: 03/15/2000 Document Revised: 11/18/2012 Document Reviewed: 08/20/2012 [facility] Patient Information 2014 [place]. Managing Your High Blood Pressure Blood pressure is a measurement of how forceful your blood is pressing against the walls of the arteries. Arteries are muscular tubes within the circulatory system. Blood pressure does not stay the same. Blood pressure rises when you are active, excited, or nervous; and it lowers during sleep and relaxation. If the numbers measuring your blood pressure stay above normal most of the time, you are at risk for health problems. High blood pressure (hypertension) is  a long-term (chronic) condition in which blood pressure is elevated. A blood pressure reading is recorded as two numbers, such as 120 over 80 (or 120/80). The first, higher number is called the systolic pressure. It is a measure of the pressure in your arteries as the heart beats. The second, lower number is called the diastolic pressure. It is a measure of the pressure in your arteries as the heart relaxes between beats.  Keeping your blood pressure in a normal range is important to your overall  health and prevention of health problems, such as heart disease and stroke. When your blood pressure is uncontrolled, your heart has to work harder than normal. High blood pressure is a very common condition in adults because blood pressure tends to rise with age. Men and women are equally likely to have hypertension but at different times in life. Before age 33, men are more likely to have hypertension. After 49 years of age, women are more likely to have it. Hypertension is especially common in African Americans. This condition often has no signs or symptoms. The cause of the condition is usually not known. Your caregiver can help you come up with a plan to keep your blood pressure in a normal, healthy range. BLOOD PRESSURE STAGES Blood pressure is classified into four stages: normal, prehypertension, stage 1, and stage 2. Your blood pressure reading will be used to determine what type of treatment, if any, is necessary. Appropriate treatment options are tied to these four stages:  Normal  Systolic pressure (mm Hg): below 120.  Diastolic pressure (mm Hg): below 80. Prehypertension  Systolic pressure (mm Hg): 120 to 139.  Diastolic pressure (mm Hg): 80 to 89. Stage1  Systolic pressure (mm Hg): 140 to 159.  Diastolic pressure (mm Hg): 90 to 99. Stage2  Systolic pressure (mm Hg): 160 or above.  Diastolic pressure (mm Hg): 100 or above. RISKS RELATED TO HIGH BLOOD PRESSURE Managing your blood pressure is an important responsibility. Uncontrolled high blood pressure can lead to:  A heart attack.  A stroke.  A weakened blood vessel (aneurysm).  Heart failure.  Kidney damage.  Eye damage.  Metabolic syndrome.  Memory and concentration problems. HOW TO MANAGE YOUR BLOOD PRESSURE Blood pressure can be managed effectively with lifestyle changes and medicines (if needed). Your caregiver will help you come up with a plan to bring your blood pressure within a normal range. Your plan  should include the following: Education  Read all information provided by your caregivers about how to control blood pressure.  Educate yourself on the latest guidelines and treatment recommendations. New research is always being done to further define the risks and treatments for high blood pressure. Lifestylechanges  Control your weight.  Avoid smoking.  Stay physically active.  Reduce the amount of salt in your diet.  Reduce stress.  Control any chronic conditions, such as high cholesterol or diabetes.  Reduce your alcohol intake. Medicines  Several medicines (antihypertensive medicines) are available, if needed, to bring blood pressure within a normal range. Communication  Review all the medicines you take with your caregiver because there may be side effects or interactions.  Talk with your caregiver about your diet, exercise habits, and other lifestyle factors that may be contributing to high blood pressure.  See your caregiver regularly. Your caregiver can help you create and adjust your plan for managing high blood pressure. RECOMMENDATIONS FOR TREATMENT AND FOLLOW-UP  The following recommendations are based on current guidelines for managing high blood pressure  in nonpregnant adults. Use these recommendations to identify the proper follow-up period or treatment option based on your blood pressure reading. You can discuss these options with your caregiver.  Systolic pressure of 389 to 373 or diastolic pressure of 80 to 89: Follow up with your caregiver as directed.  Systolic pressure of 428 to 768 or diastolic pressure of 90 to 100: Follow up with your caregiver within 2 months.  Systolic pressure above 115 or diastolic pressure above 726: Follow up with your caregiver within 1 month.  Systolic pressure above 203 or diastolic pressure above 559: Consider antihypertensive therapy; follow up with your caregiver within 1 week.  Systolic pressure above 741 or diastolic  pressure above 638: Begin antihypertensive therapy; follow up with your caregiver within 1 week. Document Released: 12/11/2011 Document Reviewed: 12/11/2011 [facility] Patient Information 2014 [place], [place].

## 2013-08-05 NOTE — Telephone Encounter (Signed)
Message copied by Chinita Pester on Thu Aug 05, 2013  1:23 PM ------      Message from: Brigitte Pulse, EVA      Created: Thu Aug 05, 2013 11:11 AM       Please sched pt f/u OV at 104 to recheck HTN in 2-6 wks.      Thanks,      Brigitte Pulse ------

## 2013-08-05 NOTE — Telephone Encounter (Signed)
Appt made for 4 weeks.

## 2013-08-06 ENCOUNTER — Ambulatory Visit
Admission: RE | Admit: 2013-08-06 | Discharge: 2013-08-06 | Disposition: A | Discharge status: Home / Self Care | Payer: 59 | Source: Ambulatory Visit | Attending: Family Medicine | Admitting: Family Medicine

## 2013-08-06 DIAGNOSIS — N50819 Testicular pain, unspecified: Secondary | ICD-10-CM

## 2013-08-06 DIAGNOSIS — N529 Male erectile dysfunction, unspecified: Secondary | ICD-10-CM

## 2013-08-06 LAB — CBC WITH DIFFERENTIAL/PLATELET
BASOS PCT: 0 % (ref 0–1)
Basophils Absolute: 0 10*3/uL (ref 0.0–0.1)
EOS ABS: 0.1 10*3/uL (ref 0.0–0.7)
Eosinophils Relative: 1 % (ref 0–5)
HCT: 40.1 % (ref 39.0–52.0)
HEMOGLOBIN: 13.6 g/dL (ref 13.0–17.0)
Lymphocytes Relative: 37 % (ref 12–46)
Lymphs Abs: 2.4 10*3/uL (ref 0.7–4.0)
MCH: 27.8 pg (ref 26.0–34.0)
MCHC: 33.9 g/dL (ref 30.0–36.0)
MCV: 81.8 fL (ref 78.0–100.0)
Monocytes Absolute: 0.4 10*3/uL (ref 0.1–1.0)
Monocytes Relative: 6 % (ref 3–12)
NEUTROS PCT: 56 % (ref 43–77)
Neutro Abs: 3.6 10*3/uL (ref 1.7–7.7)
PLATELETS: 258 10*3/uL (ref 150–400)
RBC: 4.9 MIL/uL (ref 4.22–5.81)
RDW: 13.8 % (ref 11.5–15.5)
WBC: 6.5 10*3/uL (ref 4.0–10.5)

## 2013-08-06 LAB — LIPID PANEL
CHOL/HDL RATIO: 2.7 ratio
Cholesterol: 127 mg/dL (ref 0–200)
HDL: 47 mg/dL (ref >39)
LDL CALC: 70 mg/dL (ref 0–99)
Triglycerides: 50 mg/dL (ref <150)
VLDL: 10 mg/dL (ref 0–40)

## 2013-08-06 LAB — COMPLETE METABOLIC PANEL WITH GFR
ALT: 22 U/L (ref 0–53)
AST: 20 U/L (ref 0–37)
Albumin: 4.7 g/dL (ref 3.5–5.2)
Alkaline Phosphatase: 59 U/L (ref 39–117)
BILIRUBIN TOTAL: 0.7 mg/dL (ref 0.2–1.2)
BUN: 17 mg/dL (ref 6–23)
CALCIUM: 9.8 mg/dL (ref 8.4–10.5)
CHLORIDE: 104 meq/L (ref 96–112)
CO2: 30 mEq/L (ref 19–32)
Creat: 0.96 mg/dL (ref 0.50–1.35)
GFR, Est African American: >89 mL/min
GLUCOSE: 90 mg/dL (ref 70–99)
Potassium: 4.1 mEq/L (ref 3.5–5.3)
SODIUM: 141 meq/L (ref 135–145)
TOTAL PROTEIN: 7.5 g/dL (ref 6.0–8.3)

## 2013-08-06 LAB — TSH: TSH: 1.793 u[IU]/mL (ref 0.350–4.500)

## 2013-08-06 LAB — HIV ANTIBODY (ROUTINE TESTING W REFLEX): HIV: NONREACTIVE

## 2013-08-06 LAB — PSA: PSA: 1.81 ng/mL (ref <=4.00)

## 2013-08-06 LAB — HEPATITIS C ANTIBODY: HCV Ab: NEGATIVE

## 2013-08-06 LAB — GC/CHLAMYDIA PROBE AMP
CT Probe RNA: NEGATIVE
GC PROBE AMP APTIMA: NEGATIVE

## 2013-08-06 LAB — RPR

## 2013-08-07 ENCOUNTER — Encounter: Payer: Self-pay | Admitting: Family Medicine

## 2013-09-03 ENCOUNTER — Encounter: Payer: Self-pay | Admitting: Family Medicine

## 2013-09-03 ENCOUNTER — Ambulatory Visit (INDEPENDENT_AMBULATORY_CARE_PROVIDER_SITE_OTHER): Payer: 59 | Admitting: Family Medicine

## 2013-09-03 VITALS — BP 158/80 | HR 70 | Temp 98.0°F | Resp 18 | Wt 203.0 lb

## 2013-09-03 DIAGNOSIS — G4726 Circadian rhythm sleep disorder, shift work type: Secondary | ICD-10-CM

## 2013-09-03 DIAGNOSIS — I10 Essential (primary) hypertension: Secondary | ICD-10-CM

## 2013-09-03 DIAGNOSIS — Z79899 Other long term (current) drug therapy: Secondary | ICD-10-CM

## 2013-09-03 DIAGNOSIS — F419 Anxiety disorder, unspecified: Secondary | ICD-10-CM

## 2013-09-03 LAB — BASIC METABOLIC PANEL
BUN: 15 mg/dL (ref 6–23)
CALCIUM: 9.8 mg/dL (ref 8.4–10.5)
CO2: 31 mEq/L (ref 19–32)
CREATININE: 1.03 mg/dL (ref 0.50–1.35)
Chloride: 100 mEq/L (ref 96–112)
GLUCOSE: 97 mg/dL (ref 70–99)
Potassium: 4.1 mEq/L (ref 3.5–5.3)
Sodium: 141 mEq/L (ref 135–145)

## 2013-09-03 MED ORDER — AMLODIPINE BESYLATE 10 MG PO TABS
10.0000 mg | ORAL_TABLET | Freq: Every day | ORAL | Status: DC
Start: 2013-09-03 — End: 2013-10-17

## 2013-09-03 NOTE — Progress Notes (Signed)
   Subjective:    Patient ID: Kevin Jarvis, male    DOB: [birth year], 48 y.o.   MRN: [id number] Chief Complaint  Patient presents with  . Hypertension     HPI  Is taking meds every day. 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   Checks BP outside office occ.  BP is always the same no matter what or where or if he on his blood pressure medication at all.  Past Medical History  Diagnosis Date  . Arthritis   . Anxiety   . Hypertension    Current Outpatient Prescriptions on File Prior to Visit  Medication Sig Dispense Refill  . traZODone (DESYREL) 50 MG tablet Take 0.5-1 tablets (25-50 mg total) by mouth at bedtime as needed for sleep.  30 tablet  3   No current facility-administered medications on file prior to visit.   No Known Allergies   Review of Systems  Constitutional: Negative for fever and chills.  Eyes: Negative for visual disturbance.  Respiratory: Negative for shortness of breath.   Cardiovascular: Negative for chest pain and leg swelling.  Neurological: Negative for dizziness, syncope, facial asymmetry, weakness, light-headedness and headaches.  Psychiatric/Behavioral: Positive for sleep disturbance and decreased concentration. Negative for dysphoric mood. The patient is nervous/anxious.        Objective:  BP 158/80  Pulse 70  Temp(Src) 98 F (36.7 C)  Resp 18  Wt 203 lb (92.08 kg)  SpO2 99%  Physical Exam  Constitutional: He is oriented to person, place, and time. He appears well-developed and well-nourished. No distress.  HENT:  Head: Normocephalic and atraumatic.  Eyes: Conjunctivae are normal. Pupils are equal, round, and reactive to light. No scleral icterus.  Neck: Normal range of motion. Neck supple. No thyromegaly present.  Cardiovascular: Normal rate, regular rhythm, normal heart sounds and intact distal pulses.   Pulmonary/Chest: Effort normal and breath sounds normal. No respiratory distress.  Musculoskeletal: He exhibits no edema.    Lymphadenopathy:    He has no cervical adenopathy.  Neurological: He is alert and oriented to person, place, and time.  Skin: Skin is warm and dry. He is not diaphoretic.  Psychiatric: He has a normal mood and affect. His behavior is normal.          Assessment & Plan:   Hypertension - Plan: Basic metabolic panel  Sleep disorder, shift work  Anxiety  Meds ordered this encounter  Medications  . amLODipine (NORVASC) 10 MG tablet    Sig: Take 1 tablet (10 mg total) by mouth daily.    Dispense:  90 tablet    Refill:  0    I personally performed the services described in this documentation, which was scribed in my presence. The recorded information has been reviewed and considered, and addended by me as needed.  [name], MD MPH

## 2013-09-03 NOTE — Patient Instructions (Signed)
Managing Your High Blood Pressure Blood pressure is a measurement of how forceful your blood is pressing against the walls of the arteries. Arteries are muscular tubes within the circulatory system. Blood pressure does not stay the same. Blood pressure rises when you are active, excited, or nervous; and it lowers during sleep and relaxation. If the numbers measuring your blood pressure stay above normal most of the time, you are at risk for health problems. High blood pressure (hypertension) is a long-term (chronic) condition in which blood pressure is elevated. A blood pressure reading is recorded as two numbers, such as 120 over 80 (or 120/80). The first, higher number is called the systolic pressure. It is a measure of the pressure in your arteries as the heart beats. The second, lower number is called the diastolic pressure. It is a measure of the pressure in your arteries as the heart relaxes between beats.  Keeping your blood pressure in a normal range is important to your overall health and prevention of health problems, such as heart disease and stroke. When your blood pressure is uncontrolled, your heart has to work harder than normal. High blood pressure is a very common condition in adults because blood pressure tends to rise with age. Men and women are equally likely to have hypertension but at different times in life. Before age 45, men are more likely to have hypertension. After 49 years of age, women are more likely to have it. Hypertension is especially common in African Americans. This condition often has no signs or symptoms. The cause of the condition is usually not known. Your caregiver can help you come up with a plan to keep your blood pressure in a normal, healthy range. BLOOD PRESSURE STAGES Blood pressure is classified into four stages: normal, prehypertension, stage 1, and stage 2. Your blood pressure reading will be used to determine what type of treatment, if any, is necessary.  Appropriate treatment options are tied to these four stages:  Normal  Systolic pressure (mm Hg): below 120.  Diastolic pressure (mm Hg): below 80. Prehypertension  Systolic pressure (mm Hg): 120 to 139.  Diastolic pressure (mm Hg): 80 to 89. Stage1  Systolic pressure (mm Hg): 140 to 159.  Diastolic pressure (mm Hg): 90 to 99. Stage2  Systolic pressure (mm Hg): 160 or above.  Diastolic pressure (mm Hg): 100 or above. RISKS RELATED TO HIGH BLOOD PRESSURE Managing your blood pressure is an important responsibility. Uncontrolled high blood pressure can lead to:  A heart attack.  A stroke.  A weakened blood vessel (aneurysm).  Heart failure.  Kidney damage.  Eye damage.  Metabolic syndrome.  Memory and concentration problems. HOW TO MANAGE YOUR BLOOD PRESSURE Blood pressure can be managed effectively with lifestyle changes and medicines (if needed). Your caregiver will help you come up with a plan to bring your blood pressure within a normal range. Your plan should include the following: Education  Read all information provided by your caregivers about how to control blood pressure.  Educate yourself on the latest guidelines and treatment recommendations. New research is always being done to further define the risks and treatments for high blood pressure. Lifestylechanges  Control your weight.  Avoid smoking.  Stay physically active.  Reduce the amount of salt in your diet.  Reduce stress.  Control any chronic conditions, such as high cholesterol or diabetes.  Reduce your alcohol intake. Medicines  Several medicines (antihypertensive medicines) are available, if needed, to bring blood pressure within a normal range.  Communication  Review all the medicines you take with your caregiver because there may be side effects or interactions.  Talk with your caregiver about your diet, exercise habits, and other lifestyle factors that may be contributing to  high blood pressure.  See your caregiver regularly. Your caregiver can help you create and adjust your plan for managing high blood pressure. RECOMMENDATIONS FOR TREATMENT AND FOLLOW-UP  The following recommendations are based on current guidelines for managing high blood pressure in nonpregnant adults. Use these recommendations to identify the proper follow-up period or treatment option based on your blood pressure reading. You can discuss these options with your caregiver.  Systolic pressure of 537 to 482 or diastolic pressure of 80 to 89: Follow up with your caregiver as directed.  Systolic pressure of 707 to 867 or diastolic pressure of 90 to 100: Follow up with your caregiver within 2 months.  Systolic pressure above 544 or diastolic pressure above 920: Follow up with your caregiver within 1 month.  Systolic pressure above 100 or diastolic pressure above 712: Consider antihypertensive therapy; follow up with your caregiver within 1 week.  Systolic pressure above 197 or diastolic pressure above 588: Begin antihypertensive therapy; follow up with your caregiver within 1 week. Document Released: 12/11/2011 Document Reviewed: 12/11/2011 [facility] Patient Information 2014 [place], [place].   DASH Diet The DASH diet stands for "Dietary Approaches to Stop Hypertension." It is a healthy eating plan that has been shown to reduce high blood pressure (hypertension) in as little as 14 days, while also possibly providing other significant health benefits. These other health benefits include reducing the risk of breast cancer after menopause and reducing the risk of type 2 diabetes, heart disease, colon cancer, and stroke. Health benefits also include weight loss and slowing kidney failure in patients with chronic kidney disease.  DIET GUIDELINES  Limit salt (sodium). Your diet should contain less than 1500 mg of sodium daily.  Limit refined or processed carbohydrates. Your diet should include mostly  whole grains. Desserts and added sugars should be used sparingly.  Include small amounts of heart-healthy fats. These types of fats include nuts, oils, and tub margarine. Limit saturated and trans fats. These fats have been shown to be harmful in the body. CHOOSING FOODS  The following food groups are based on a 2000 calorie diet. See your Registered Dietitian for individual calorie needs. Grains and Grain Products (6 to 8 servings daily)  Eat More Often: Whole-wheat bread, brown rice, whole-grain or wheat pasta, quinoa, popcorn without added fat or salt (air popped).  Eat Less Often: White bread, white pasta, white rice, cornbread. Vegetables (4 to 5 servings daily)  Eat More Often: Fresh, frozen, and canned vegetables. Vegetables may be raw, steamed, roasted, or grilled with a minimal amount of fat.  Eat Less Often/Avoid: Creamed or fried vegetables. Vegetables in a cheese sauce. Fruit (4 to 5 servings daily)  Eat More Often: All fresh, canned (in natural juice), or frozen fruits. Dried fruits without added sugar. One hundred percent fruit juice ( cup [237 mL] daily).  Eat Less Often: Dried fruits with added sugar. Canned fruit in light or heavy syrup. [employer], Fish, and Poultry (2 servings or less daily. One serving is 3 to 4 oz [85-114 g]).  Eat More Often: Ninety percent or leaner ground beef, tenderloin, sirloin. Round cuts of beef, chicken breast, [place] breast. All fish. Grill, bake, or broil your meat. Nothing should be fried.  Eat Less Often/Avoid: Fatty cuts of meat, [place],  or chicken leg, thigh, or wing. Fried cuts of meat or fish. Dairy (2 to 3 servings)  Eat More Often: Low-fat or fat-free milk, low-fat plain or light yogurt, reduced-fat or part-skim cheese.  Eat Less Often/Avoid: Milk (whole, 2%).Whole milk yogurt. Full-fat cheeses. Nuts, Seeds, and Legumes (4 to 5 servings per week)  Eat More Often: All without added salt.  Eat Less Often/Avoid: Salted nuts and  seeds, canned beans with added salt. Fats and Sweets (limited)  Eat More Often: Vegetable oils, tub margarines without trans fats, sugar-free gelatin. Mayonnaise and salad dressings.  Eat Less Often/Avoid: Coconut oils, palm oils, butter, stick margarine, cream, half and half, cookies, candy, pie. FOR MORE INFORMATION The Dash Diet Eating Plan: www.dashdiet.org Document Released: 03/07/2011 Document Revised: 06/10/2011 Document Reviewed: 03/07/2011 [facility] Patient Information 2014 [place], [place].

## 2013-09-07 ENCOUNTER — Encounter: Payer: Self-pay | Admitting: Family Medicine

## 2013-10-15 ENCOUNTER — Encounter: Payer: Self-pay | Admitting: Family Medicine

## 2013-10-15 ENCOUNTER — Ambulatory Visit (INDEPENDENT_AMBULATORY_CARE_PROVIDER_SITE_OTHER): Payer: 59 | Admitting: Family Medicine

## 2013-10-15 VITALS — BP 142/78 | HR 71 | Temp 98.5°F | Resp 16 | Ht 69.0 in | Wt 203.6 lb

## 2013-10-15 DIAGNOSIS — I10 Essential (primary) hypertension: Secondary | ICD-10-CM

## 2013-10-15 DIAGNOSIS — Z79899 Other long term (current) drug therapy: Secondary | ICD-10-CM

## 2013-10-15 DIAGNOSIS — G4726 Circadian rhythm sleep disorder, shift work type: Secondary | ICD-10-CM

## 2013-10-15 NOTE — Progress Notes (Addendum)
This chart was scribed for Shawnee Knapp, MD by Elby Beck, Scribe. This patient was seen in room 28 and the patient's care was started at 9:33 AM.  Subjective:    Patient ID: Kevin Jarvis, male    DOB: 11/11/1965, 48 y.o.   MRN: [id number]  Chief Complaint  Patient presents with  . Follow-up    BLOOD PRESSURE   HPI  HPI Comments: Kevin Jarvis is a 48 y.o. Male with a history of HTN who presents to [facility] for a follow-up evaluation of his blood pressure. His blood pressure taken here upon arrival was 142/78. His blood pressure taken during the physician-patient encounter was 130/70. He states that he has not been monitoring his blood pressure at home. He states that he does not have a blood pressure cuff at home or at work. He states that he has been feeling well with no symptoms recently. He denies experiencing any cough, SOB, chest pain, leg swelling, urinary frequency, lightheadedness, dizziness or fatigue. He works ar nights and states that he no longer needs to take his Trazodone to sleep in the days.   Past Medical History  Diagnosis Date  . Arthritis   . Anxiety   . Hypertension    Current Outpatient Prescriptions on File Prior to Visit  Medication Sig Dispense Refill  . amLODipine (NORVASC) 10 MG tablet Take 1 tablet (10 mg total) by mouth daily.  90 tablet  0  . lisinopril-hydrochlorothiazide (PRINZIDE,ZESTORETIC) 20-25 MG per tablet Take 1 tablet by mouth daily.  90 tablet  0  . propranolol ER (INDERAL LA) 80 MG 24 hr capsule Take 1 capsule (80 mg total) by mouth daily.  90 capsule  0  . traZODone (DESYREL) 50 MG tablet Take 0.5-1 tablets (25-50 mg total) by mouth at bedtime as needed for sleep.  30 tablet  3   No current facility-administered medications on file prior to visit.   No Known Allergies   Review of Systems  Constitutional: Negative for appetite change and fatigue.  HENT: Negative for sore throat.   Eyes: Negative for visual  disturbance.  Respiratory: Negative for cough and shortness of breath.   Cardiovascular: Negative for chest pain and leg swelling.  Gastrointestinal: Negative for nausea, vomiting, abdominal pain and diarrhea.  Genitourinary: Negative for frequency and hematuria.  Skin: Negative for pallor and rash.  Neurological: Negative for dizziness, syncope, light-headedness and headaches.  Psychiatric/Behavioral: Negative for behavioral problems and confusion.      BP 142/78  Pulse 71  Temp(Src) 98.5 F (36.9 C) (Oral)  Resp 16  Ht 5\' 9"  (1.753 m)  Wt 203 lb 9.6 oz (92.352 kg)  BMI 30.05 kg/m2  SpO2 99% Objective:   Physical Exam  Nursing note and vitals reviewed. Constitutional: He is oriented to person, place, and time. He appears well-developed and well-nourished. No distress.  HENT:  Head: Normocephalic and atraumatic.  Eyes: Conjunctivae and EOM are normal.  Neck: Neck supple. No tracheal deviation present. No thyromegaly present.  Cardiovascular: Normal rate, regular rhythm, S1 normal and S2 normal.   No murmur heard. Pulmonary/Chest: Effort normal and breath sounds normal. No respiratory distress. He has no wheezes. He has no rales.  Lungs clear to auscultation bilaterally  Musculoskeletal: Normal range of motion.  Neurological: He is alert and oriented to person, place, and time.  Skin: Skin is warm and dry.  Psychiatric: He has a normal mood and affect. His behavior is normal.  Assessment & Plan:  Advised pt to monitor his blood pressure at home if possible. Sleep disorder, shift work  Long term use of drug  Essential hypertension - Plan: lisinopril-hydrochlorothiazide (PRINZIDE,ZESTORETIC) 20-25 MG per tablet, propranolol ER (INDERAL LA) 80 MG 24 hr capsule  Meds ordered this encounter  Medications  . amLODipine (NORVASC) 10 MG tablet    Sig: Take 1 tablet (10 mg total) by mouth daily.    Dispense:  90 tablet    Refill:  1  . lisinopril-hydrochlorothiazide  (PRINZIDE,ZESTORETIC) 20-25 MG per tablet    Sig: Take 1 tablet by mouth daily.    Dispense:  90 tablet    Refill:  1  . propranolol ER (INDERAL LA) 80 MG 24 hr capsule    Sig: Take 1 capsule (80 mg total) by mouth daily.    Dispense:  90 capsule    Refill:  1    I personally performed the services described in this documentation, which was scribed in my presence. The recorded information has been reviewed and considered, and addended by me as needed.  [name], MD MPH

## 2013-10-15 NOTE — Patient Instructions (Signed)
DASH Eating Plan  DASH stands for "Dietary Approaches to Stop Hypertension." The DASH eating plan is a healthy eating plan that has been shown to reduce high blood pressure (hypertension). Additional health benefits may include reducing the risk of type 2 diabetes mellitus, heart disease, and stroke. The DASH eating plan may also help with weight loss.  WHAT DO I NEED TO KNOW ABOUT THE DASH EATING PLAN?  For the DASH eating plan, you will follow these general guidelines:  · Choose foods with a percent daily value for sodium of less than 5% (as listed on the food label).  · Use salt-free seasonings or herbs instead of table salt or sea salt.  · Check with your health care provider or pharmacist before using salt substitutes.  · Eat lower-sodium products, often labeled as "lower sodium" or "no salt added."  · Eat fresh foods.  · Eat more vegetables, fruits, and low-fat dairy products.  · Choose whole grains. Look for the word "whole" as the first word in the ingredient list.  · Choose fish and skinless chicken or turkey more often than red meat. Limit fish, poultry, and meat to 6 oz (170 g) each day.  · Limit sweets, desserts, sugars, and sugary drinks.  · Choose heart-healthy fats.  · Limit cheese to 1 oz (28 g) per day.  · Eat more home-cooked food and less restaurant, buffet, and fast food.  · Limit fried foods.  · Cook foods using methods other than frying.  · Limit canned vegetables. If you do use them, rinse them well to decrease the sodium.  · When eating at a restaurant, ask that your food be prepared with less salt, or no salt if possible.  WHAT FOODS CAN I EAT?  Seek help from a dietitian for individual calorie needs.  Grains  Whole grain or whole wheat bread. Brown rice. Whole grain or whole wheat pasta. Quinoa, bulgur, and whole grain cereals. Low-sodium cereals. Corn or whole wheat flour tortillas. Whole grain cornbread. Whole grain crackers. Low-sodium crackers.  Vegetables  Fresh or frozen vegetables  (raw, steamed, roasted, or grilled). Low-sodium or reduced-sodium tomato and vegetable juices. Low-sodium or reduced-sodium tomato sauce and paste. Low-sodium or reduced-sodium canned vegetables.   Fruits  All fresh, canned (in natural juice), or frozen fruits.  Meat and Other Protein Products  Ground beef (85% or leaner), grass-fed beef, or beef trimmed of fat. Skinless chicken or turkey. Ground chicken or turkey. Pork trimmed of fat. All fish and seafood. Eggs. Dried beans, peas, or lentils. Unsalted nuts and seeds. Unsalted canned beans.  Dairy  Low-fat dairy products, such as skim or 1% milk, 2% or reduced-fat cheeses, low-fat ricotta or cottage cheese, or plain low-fat yogurt. Low-sodium or reduced-sodium cheeses.  Fats and Oils  Tub margarines without trans fats. Light or reduced-fat mayonnaise and salad dressings (reduced sodium). Avocado. Safflower, olive, or canola oils. Natural peanut or almond butter.  Other  Unsalted popcorn and pretzels.  The items listed above may not be a complete list of recommended foods or beverages. Contact your dietitian for more options.  WHAT FOODS ARE NOT RECOMMENDED?  Grains  White bread. White pasta. White rice. Refined cornbread. Bagels and croissants. Crackers that contain trans fat.  Vegetables  Creamed or fried vegetables. Vegetables in a cheese sauce. Regular canned vegetables. Regular canned tomato sauce and paste. Regular tomato and vegetable juices.  Fruits  Dried fruits. Canned fruit in light or heavy syrup. Fruit juice.  Meat and Other Protein   Products  Fatty cuts of meat. Ribs, chicken wings, bacon, sausage, bologna, salami, chitterlings, fatback, hot dogs, bratwurst, and packaged luncheon meats. Salted nuts and seeds. Canned beans with salt.  Dairy  Whole or 2% milk, cream, half-and-half, and cream cheese. Whole-fat or sweetened yogurt. Full-fat cheeses or blue cheese. Nondairy creamers and whipped toppings. Processed cheese, cheese spreads, or cheese  curds.  Condiments  Onion and garlic salt, seasoned salt, table salt, and sea salt. Canned and packaged gravies. Worcestershire sauce. Tartar sauce. Barbecue sauce. Teriyaki sauce. Soy sauce, including reduced sodium. Steak sauce. Fish sauce. Oyster sauce. Cocktail sauce. Horseradish. Ketchup and mustard. Meat flavorings and tenderizers. Bouillon cubes. Hot sauce. Tabasco sauce. Marinades. Taco seasonings. Relishes.  Fats and Oils  Butter, stick margarine, lard, shortening, ghee, and bacon fat. Coconut, palm kernel, or palm oils. Regular salad dressings.  Other  Pickles and olives. Salted popcorn and pretzels.  The items listed above may not be a complete list of foods and beverages to avoid. Contact your dietitian for more information.  WHERE CAN I FIND MORE INFORMATION?  National Heart, Lung, and Blood Institute: www.nhlbi.nih.gov/health/health-topics/topics/dash/  Document Released: 03/07/2011 Document Revised: 03/23/2013 Document Reviewed: 01/20/2013  ExitCare® Patient Information ©2015 [employer]. This information is not intended to replace advice given to you by your health care provider. Make sure you discuss any questions you have with your health care provider.

## 2013-10-17 MED ORDER — PROPRANOLOL HCL ER 80 MG PO CP24: 80.0000 mg | ORAL_CAPSULE | Freq: Every day | ORAL | Status: DC

## 2013-10-17 MED ORDER — AMLODIPINE BESYLATE 10 MG PO TABS: 10.0000 mg | ORAL_TABLET | Freq: Every day | ORAL | Status: DC

## 2013-10-17 MED ORDER — LISINOPRIL-HYDROCHLOROTHIAZIDE 20-25 MG PO TABS
1.0000 | ORAL_TABLET | Freq: Every day | ORAL | Status: DC
Start: 2013-10-17 — End: 2014-04-15

## 2014-01-05 ENCOUNTER — Telehealth: Payer: Self-pay | Admitting: Family Medicine

## 2014-01-05 NOTE — Telephone Encounter (Signed)
Patient dropped off a biometric screening affidavit to be completed and faxed by Dr. Brigitte Pulse. Form is due by 01/13/2014. Patient has also requested that the original form be mailed to his home after it has been faxed. Placed form in Dr. Raul Del box.    [phone]

## 2014-01-09 NOTE — Telephone Encounter (Signed)
Form in clinical nurse's box

## 2014-01-09 NOTE — Telephone Encounter (Signed)
We also need to know if he exercises at least 30 min 5x/wk. Left message on machine to call back

## 2014-01-09 NOTE — Telephone Encounter (Signed)
Form completed but he needs his waist circumference measured in inches. If he can come by clinic to pick up form than maybe an assistant could just measure him quickly and record without an OV.  Or if he wants to measure at home and let us know I would be ok w/ that.

## 2014-01-10 NOTE — Telephone Encounter (Signed)
Pt states someone called him this weekend and he was sleep, missed the call. Please call back at [phone]

## 2014-01-10 NOTE — Telephone Encounter (Signed)
Spoke to pt- he is coming today at noon- biometric form left in Triage box for when pt arrives Measure waist- copy-fax and give pt back the original.

## 2014-04-15 ENCOUNTER — Ambulatory Visit (INDEPENDENT_AMBULATORY_CARE_PROVIDER_SITE_OTHER): Payer: 59 | Admitting: Family Medicine

## 2014-04-15 ENCOUNTER — Encounter: Payer: Self-pay | Admitting: Family Medicine

## 2014-04-15 VITALS — BP 192/102 | HR 71 | Temp 98.0°F | Resp 16 | Ht 69.5 in | Wt 206.4 lb

## 2014-04-15 DIAGNOSIS — Z79899 Other long term (current) drug therapy: Secondary | ICD-10-CM

## 2014-04-15 DIAGNOSIS — I10 Essential (primary) hypertension: Secondary | ICD-10-CM

## 2014-04-15 DIAGNOSIS — E041 Nontoxic single thyroid nodule: Secondary | ICD-10-CM

## 2014-04-15 DIAGNOSIS — Z23 Encounter for immunization: Secondary | ICD-10-CM

## 2014-04-15 LAB — COMPREHENSIVE METABOLIC PANEL
ALBUMIN: 4.4 g/dL (ref 3.5–5.2)
ALK PHOS: 55 U/L (ref 39–117)
ALT: 24 U/L (ref 0–53)
AST: 19 U/L (ref 0–37)
BUN: 13 mg/dL (ref 6–23)
CALCIUM: 9.9 mg/dL (ref 8.4–10.5)
CHLORIDE: 100 meq/L (ref 96–112)
CO2: 30 meq/L (ref 19–32)
Creat: 1.01 mg/dL (ref 0.50–1.35)
Glucose, Bld: 89 mg/dL (ref 70–99)
POTASSIUM: 3.7 meq/L (ref 3.5–5.3)
Sodium: 139 mEq/L (ref 135–145)
Total Bilirubin: 0.5 mg/dL (ref 0.2–1.2)
Total Protein: 7.6 g/dL (ref 6.0–8.3)

## 2014-04-15 LAB — LIPID PANEL
CHOLESTEROL: 146 mg/dL (ref 0–200)
HDL: 40 mg/dL (ref >39)
LDL CALC: 91 mg/dL (ref 0–99)
Total CHOL/HDL Ratio: 3.7 Ratio
Triglycerides: 76 mg/dL (ref <150)
VLDL: 15 mg/dL (ref 0–40)

## 2014-04-15 LAB — TSH: TSH: 1.124 u[IU]/mL (ref 0.350–4.500)

## 2014-04-15 MED ORDER — LISINOPRIL-HYDROCHLOROTHIAZIDE 20-25 MG PO TABS
1.0000 | ORAL_TABLET | Freq: Every day | ORAL | Status: DC
Start: 2014-04-15 — End: 2015-04-03

## 2014-04-15 MED ORDER — PROPRANOLOL HCL ER 80 MG PO CP24: 80.0000 mg | ORAL_CAPSULE | Freq: Every day | ORAL | Status: DC

## 2014-04-15 MED ORDER — AMLODIPINE BESYLATE 10 MG PO TABS: 10.0000 mg | ORAL_TABLET | Freq: Every day | ORAL | Status: DC

## 2014-04-15 NOTE — Patient Instructions (Signed)
After your 49th birthday, come back in for your full physical.  You do NOT need to be fasting since we checked your cholesterol today.  However, I do want to recheck on your thyroid. I do highly recommend purchasing a home blood pressure monitor - know what your BP is doing at home will give [place] a much more accurate picture of your health and what we need to do with your medications.  DASH Eating Plan DASH stands for "Dietary Approaches to Stop Hypertension." The DASH eating plan is a healthy eating plan that has been shown to reduce high blood pressure (hypertension). Additional health benefits may include reducing the risk of type 2 diabetes mellitus, heart disease, and stroke. The DASH eating plan may also help with weight loss. WHAT DO I NEED TO KNOW ABOUT THE DASH EATING PLAN? For the DASH eating plan, you will follow these general guidelines:  Choose foods with a percent daily value for sodium of less than 5% (as listed on the food label).  Use salt-free seasonings or herbs instead of table salt or sea salt.  Check with your health care provider or pharmacist before using salt substitutes.  Eat lower-sodium products, often labeled as "lower sodium" or "no salt added."  Eat fresh foods.  Eat more vegetables, fruits, and low-fat dairy products.  Choose whole grains. Look for the word "whole" as the first word in the ingredient list.  Choose fish and skinless chicken or [place] more often than red meat. Limit fish, poultry, and meat to 6 oz (170 g) each day.  Limit sweets, desserts, sugars, and sugary drinks.  Choose heart-healthy fats.  Limit cheese to 1 oz (28 g) per day.  Eat more home-cooked food and less restaurant, buffet, and fast food.  Limit fried foods.  Cook foods using methods other than frying.  Limit canned vegetables. If you do use them, rinse them well to decrease the sodium.  When eating at a restaurant, ask that your food be prepared with less salt, or no salt  if possible. WHAT FOODS CAN I EAT? Seek help from a dietitian for individual calorie needs. Grains Whole grain or whole wheat bread. Brown rice. Whole grain or whole wheat pasta. Quinoa, bulgur, and whole grain cereals. Low-sodium cereals. Corn or whole wheat flour tortillas. Whole grain cornbread. Whole grain crackers. Low-sodium crackers. Vegetables Fresh or frozen vegetables (raw, steamed, roasted, or grilled). Low-sodium or reduced-sodium tomato and vegetable juices. Low-sodium or reduced-sodium tomato sauce and paste. Low-sodium or reduced-sodium canned vegetables.  Fruits All fresh, canned (in natural juice), or frozen fruits. Meat and Other Protein Products Ground beef (85% or leaner), grass-fed beef, or beef trimmed of fat. Skinless chicken or [place]. Ground chicken or [place]. Pork trimmed of fat. All fish and seafood. Eggs. Dried beans, peas, or lentils. Unsalted nuts and seeds. Unsalted canned beans. Dairy Low-fat dairy products, such as skim or 1% milk, 2% or reduced-fat cheeses, low-fat ricotta or cottage cheese, or plain low-fat yogurt. Low-sodium or reduced-sodium cheeses. Fats and Oils Tub margarines without trans fats. Light or reduced-fat mayonnaise and salad dressings (reduced sodium). Avocado. Safflower, olive, or canola oils. Natural peanut or almond butter. Other Unsalted popcorn and pretzels. The items listed above may not be a complete list of recommended foods or beverages. Contact your dietitian for more options. WHAT FOODS ARE NOT RECOMMENDED? Grains White bread. White pasta. White rice. Refined cornbread. Bagels and croissants. Crackers that contain trans fat. Vegetables Creamed or fried vegetables. Vegetables in a cheese  sauce. Regular canned vegetables. Regular canned tomato sauce and paste. Regular tomato and vegetable juices. Fruits Dried fruits. Canned fruit in light or heavy syrup. Fruit juice. Meat and Other Protein Products Fatty cuts of meat. Ribs,  chicken wings, bacon, sausage, bologna, salami, chitterlings, fatback, hot dogs, bratwurst, and packaged luncheon meats. Salted nuts and seeds. Canned beans with salt. Dairy Whole or 2% milk, cream, half-and-half, and cream cheese. Whole-fat or sweetened yogurt. Full-fat cheeses or blue cheese. Nondairy creamers and whipped toppings. Processed cheese, cheese spreads, or cheese curds. Condiments Onion and garlic salt, seasoned salt, table salt, and sea salt. Canned and packaged gravies. Worcestershire sauce. Tartar sauce. Barbecue sauce. Teriyaki sauce. Soy sauce, including reduced sodium. Steak sauce. Fish sauce. Oyster sauce. Cocktail sauce. Horseradish. Ketchup and mustard. Meat flavorings and tenderizers. Bouillon cubes. Hot sauce. Tabasco sauce. Marinades. Taco seasonings. Relishes. Fats and Oils Butter, stick margarine, lard, shortening, ghee, and bacon fat. Coconut, palm kernel, or palm oils. Regular salad dressings. Other Pickles and olives. Salted popcorn and pretzels. The items listed above may not be a complete list of foods and beverages to avoid. Contact your dietitian for more information. WHERE CAN I FIND MORE INFORMATION? National Heart, Lung, and Blood Institute: travelstabloid.com Document Released: 03/07/2011 Document Revised: 08/02/2013 Document Reviewed: 01/20/2013 [facility] Patient Information 2015 [place], [place]. This information is not intended to replace advice given to you by your health care provider. Make sure you discuss any questions you have with your health care provider. Managing Your High Blood Pressure Blood pressure is a measurement of how forceful your blood is pressing against the walls of the arteries. Arteries are muscular tubes within the circulatory system. Blood pressure does not stay the same. Blood pressure rises when you are active, excited, or nervous; and it lowers during sleep and relaxation. If the numbers measuring your  blood pressure stay above normal most of the time, you are at risk for health problems. High blood pressure (hypertension) is a long-term (chronic) condition in which blood pressure is elevated. A blood pressure reading is recorded as two numbers, such as 120 over 80 (or 120/80). The first, higher number is called the systolic pressure. It is a measure of the pressure in your arteries as the heart beats. The second, lower number is called the diastolic pressure. It is a measure of the pressure in your arteries as the heart relaxes between beats.  Keeping your blood pressure in a normal range is important to your overall health and prevention of health problems, such as heart disease and stroke. When your blood pressure is uncontrolled, your heart has to work harder than normal. High blood pressure is a very common condition in adults because blood pressure tends to rise with age. Men and women are equally likely to have hypertension but at different times in life. Before age 58, men are more likely to have hypertension. After 50 years of age, women are more likely to have it. Hypertension is especially common in African Americans. This condition often has no signs or symptoms. The cause of the condition is usually not known. Your caregiver can help you come up with a plan to keep your blood pressure in a normal, healthy range. BLOOD PRESSURE STAGES Blood pressure is classified into four stages: normal, prehypertension, stage 1, and stage 2. Your blood pressure reading will be used to determine what type of treatment, if any, is necessary. Appropriate treatment options are tied to these four stages:  Normal  Systolic pressure (mm Hg):  below 120.  Diastolic pressure (mm Hg): below 80. Prehypertension  Systolic pressure (mm Hg): 120 to 139.  Diastolic pressure (mm Hg): 80 to 89. Stage1  Systolic pressure (mm Hg): 140 to 159.  Diastolic pressure (mm Hg): 90 to 99. Stage2  Systolic pressure (mm  Hg): 160 or above.  Diastolic pressure (mm Hg): 100 or above. RISKS RELATED TO HIGH BLOOD PRESSURE Managing your blood pressure is an important responsibility. Uncontrolled high blood pressure can lead to:  A heart attack.  A stroke.  A weakened blood vessel (aneurysm).  Heart failure.  Kidney damage.  Eye damage.  Metabolic syndrome.  Memory and concentration problems. HOW TO MANAGE YOUR BLOOD PRESSURE Blood pressure can be managed effectively with lifestyle changes and medicines (if needed). Your caregiver will help you come up with a plan to bring your blood pressure within a normal range. Your plan should include the following: Education  Read all information provided by your caregivers about how to control blood pressure.  Educate yourself on the latest guidelines and treatment recommendations. New research is always being done to further define the risks and treatments for high blood pressure. Lifestylechanges  Control your weight.  Avoid smoking.  Stay physically active.  Reduce the amount of salt in your diet.  Reduce stress.  Control any chronic conditions, such as high cholesterol or diabetes.  Reduce your alcohol intake. Medicines  Several medicines (antihypertensive medicines) are available, if needed, to bring blood pressure within a normal range. Communication  Review all the medicines you take with your caregiver because there may be side effects or interactions.  Talk with your caregiver about your diet, exercise habits, and other lifestyle factors that may be contributing to high blood pressure.  See your caregiver regularly. Your caregiver can help you create and adjust your plan for managing high blood pressure. RECOMMENDATIONS FOR TREATMENT AND FOLLOW-UP  The following recommendations are based on current guidelines for managing high blood pressure in nonpregnant adults. Use these recommendations to identify the proper follow-up period or  treatment option based on your blood pressure reading. You can discuss these options with your caregiver.  Systolic pressure of 035 to 597 or diastolic pressure of 80 to 89: Follow up with your caregiver as directed.  Systolic pressure of 416 to 384 or diastolic pressure of 90 to 100: Follow up with your caregiver within 2 months.  Systolic pressure above 536 or diastolic pressure above 468: Follow up with your caregiver within 1 month.  Systolic pressure above 032 or diastolic pressure above 122: Consider antihypertensive therapy; follow up with your caregiver within 1 week.  Systolic pressure above 482 or diastolic pressure above 500: Begin antihypertensive therapy; follow up with your caregiver within 1 week. Document Released: 12/11/2011 Document Reviewed: 12/11/2011 [facility] Patient Information 2015 [place]. This information is not intended to replace advice given to you by your health care provider. Make sure you discuss any questions you have with your health care provider.

## 2014-04-24 NOTE — Progress Notes (Signed)
Subjective:    Patient ID: Kevin Jarvis, male    DOB: 1966/02/01, 49 y.o.   MRN: [id number] Chief Complaint  Patient presents with  . Follow-up    BLOOD PRESSURE    HPI  Pt is doing well.  Works 3rd shift so takes his BP meds every evening before work - did take last night.  Has BP monitor at home - Bp is normal.    Still is trying to get his fiance a visa to come to [place].  Very slow and frustrating process but pt has a lot of patience.  Past Medical History  Diagnosis Date  . Arthritis   . Anxiety   . Hypertension    No current outpatient prescriptions on file prior to visit.   No current facility-administered medications on file prior to visit.   No Known Allergies   Review of Systems  Constitutional: Negative for fever and chills.  Eyes: Negative for visual disturbance.  Respiratory: Negative for shortness of breath.   Cardiovascular: Negative for chest pain and leg swelling.  Neurological: Negative for dizziness, syncope, facial asymmetry, weakness, light-headedness and headaches.       Objective:  BP 192/102 mmHg  Pulse 71  Temp(Src) 98 F (36.7 C) (Oral)  Resp 16  Ht 5' 9.5" (1.765 m)  Wt 206 lb 6.4 oz (93.622 kg)  BMI 30.05 kg/m2  SpO2 98%  Physical Exam  Constitutional: He is oriented to person, place, and time. He appears well-developed and well-nourished. No distress.  HENT:  Head: Normocephalic and atraumatic.  Eyes: Conjunctivae are normal. Pupils are equal, round, and reactive to light. No scleral icterus.  Neck: Normal range of motion. Neck supple. No thyromegaly present.  Cardiovascular: Normal rate, regular rhythm, normal heart sounds and intact distal pulses.   Pulmonary/Chest: Effort normal and breath sounds normal. No respiratory distress.  Musculoskeletal: He exhibits no edema.  Lymphadenopathy:    He has no cervical adenopathy.  Neurological: He is alert and oriented to person, place, and time.  Skin: Skin is warm and dry. He is not  diaphoretic.  Psychiatric: He has a normal mood and affect. His behavior is normal.          Assessment & Plan:   Need for prophylactic vaccination and inoculation against influenza  Essential hypertension, benign - Plan:propranolol ER (INDERAL LA) 80 MG 24 hr capsule, lisinopril-hydrochlorothiazide (PRINZIDE,ZESTORETIC) 20-25 MG per tablet Lipid panel - quite elevated today but pt has been awake all night.  Start dash diet - maxed out on below meds and pt w/ white coat HTN - needs to start monitoring BP at home  Encounter for long-term (current) use of medications - Plan: Comprehensive metabolic panel  Left thyroid nodule - Plan: TSH - reassess at next visit - if TSH is abnormal or nodule persists at next [place] maintenance - Pt will sced CPE in next 6 mos as will be 49 yo in 2 mos - need to discuss cancer screening, does NOT need to be fasting as lipid panel drawn today.  Meds ordered this encounter  Medications  . propranolol ER (INDERAL LA) 80 MG 24 hr capsule    Sig: Take 1 capsule (80 mg total) by mouth daily.    Dispense:  90 capsule    Refill:  1  . lisinopril-hydrochlorothiazide (PRINZIDE,ZESTORETIC) 20-25 MG per tablet    Sig: Take 1 tablet by mouth daily.    Dispense:  90 tablet    Refill:  1  .  amLODipine (NORVASC) 10 MG tablet    Sig: Take 1 tablet (10 mg total) by mouth daily.    Dispense:  90 tablet    Refill:  1    I personally performed the services described in this documentation, which was scribed in my presence. The recorded information has been reviewed and considered, and addended by me as needed.  [name], MD MPH

## 2014-04-25 ENCOUNTER — Encounter: Payer: Self-pay | Admitting: Family Medicine

## 2014-07-15 ENCOUNTER — Ambulatory Visit: Payer: 59 | Admitting: Family Medicine

## 2015-04-03 ENCOUNTER — Ambulatory Visit (INDEPENDENT_AMBULATORY_CARE_PROVIDER_SITE_OTHER): Payer: 59 | Admitting: Family Medicine

## 2015-04-03 VITALS — BP 154/94 | HR 76 | Temp 98.8°F | Resp 17 | Ht 69.5 in | Wt 206.0 lb

## 2015-04-03 DIAGNOSIS — Z131 Encounter for screening for diabetes mellitus: Secondary | ICD-10-CM

## 2015-04-03 DIAGNOSIS — Z0000 Encounter for general adult medical examination without abnormal findings: Secondary | ICD-10-CM

## 2015-04-03 DIAGNOSIS — Z125 Encounter for screening for malignant neoplasm of prostate: Secondary | ICD-10-CM

## 2015-04-03 DIAGNOSIS — N528 Other male erectile dysfunction: Secondary | ICD-10-CM | POA: Diagnosis not present

## 2015-04-03 DIAGNOSIS — I10 Essential (primary) hypertension: Secondary | ICD-10-CM | POA: Diagnosis not present

## 2015-04-03 DIAGNOSIS — Z130 Encounter for screening for diseases of the blood and blood-forming organs and certain disorders involving the immune mechanism: Secondary | ICD-10-CM | POA: Diagnosis not present

## 2015-04-03 DIAGNOSIS — Z13220 Encounter for screening for lipoid disorders: Secondary | ICD-10-CM

## 2015-04-03 LAB — HEMOGLOBIN A1C
HEMOGLOBIN A1C: 5.6 % (ref <5.7)
MEAN PLASMA GLUCOSE: 114 mg/dL (ref <117)

## 2015-04-03 LAB — COMPREHENSIVE METABOLIC PANEL
ALK PHOS: 65 U/L (ref 40–115)
ALT: 15 U/L (ref 9–46)
AST: 15 U/L (ref 10–35)
Albumin: 4.1 g/dL (ref 3.6–5.1)
BILIRUBIN TOTAL: 0.5 mg/dL (ref 0.2–1.2)
BUN: 16 mg/dL (ref 7–25)
CALCIUM: 9.3 mg/dL (ref 8.6–10.3)
CO2: 28 mmol/L (ref 20–31)
CREATININE: 0.94 mg/dL (ref 0.70–1.33)
Chloride: 102 mmol/L (ref 98–110)
GLUCOSE: 106 mg/dL — AB (ref 65–99)
Potassium: 3.9 mmol/L (ref 3.5–5.3)
SODIUM: 139 mmol/L (ref 135–146)
Total Protein: 7 g/dL (ref 6.1–8.1)

## 2015-04-03 LAB — CBC
HCT: 40.3 % (ref 39.0–52.0)
HEMOGLOBIN: 13.8 g/dL (ref 13.0–17.0)
MCH: 28.7 pg (ref 26.0–34.0)
MCHC: 34.2 g/dL (ref 30.0–36.0)
MCV: 83.8 fL (ref 78.0–100.0)
MPV: 8.9 fL (ref 8.6–12.4)
Platelets: 264 10*3/uL (ref 150–400)
RBC: 4.81 MIL/uL (ref 4.22–5.81)
RDW: 12.6 % (ref 11.5–15.5)
WBC: 5.3 10*3/uL (ref 4.0–10.5)

## 2015-04-03 LAB — LIPID PANEL
Cholesterol: 104 mg/dL — ABNORMAL LOW (ref 125–200)
HDL: 33 mg/dL — AB (ref >=40)
LDL CALC: 55 mg/dL (ref <130)
Total CHOL/HDL Ratio: 3.2 Ratio (ref <=5.0)
Triglycerides: 82 mg/dL (ref <150)
VLDL: 16 mg/dL (ref <30)

## 2015-04-03 LAB — PSA: PSA: 1.14 ng/mL (ref <=4.00)

## 2015-04-03 MED ORDER — AMLODIPINE BESYLATE 10 MG PO TABS: ORAL_TABLET | ORAL | Status: DC

## 2015-04-03 NOTE — Progress Notes (Signed)
[facility] [address], [place] [address] [phone]  Date:  04/03/2015   Name:  Kevin Jarvis   DOB:  1966/03/10   MRN:  [id number]  PCP:  [name], MD    Chief Complaint: Annual Exam   History of Present Illness:  Kevin Jarvis is a 50 y.o. very pleasant male patient who presents with the following:  Here today for a CPE- history of HTN as below. Last seen here about a year ago. He is a Glass blower/designer for [employer].  He works a lot of hours- he does 72 hours weekly at his full time job (nights) and also a part time job.  He does feel that he sleeps ok when he has the time. He usually does not feel tired.   He would like to check his T level.   He has been fasting for 12 hours now.   He has not taking any BP medication currently- he has been off all his meds for about 2 months now.   He is married and is expecting his first son very soon- they are very excited!   He is a non- smoker, occasional alcohol, no drugs.  He would like a T level checked due to some fatigue and occasional issues with erections  He notes an occasional HA that he attributes to his HTN BP Readings from Last 3 Encounters:  04/03/15 154/94  04/15/14 192/102  10/15/13 142/78     Patient Active Problem List   Diagnosis Date Noted  . Anxiety 12/12/2011  . Long term use of drug 12/12/2011  . Hypertension 12/12/2011  . 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 Headache(784.0) 12/12/2011    Past Medical History  Diagnosis Date  . Arthritis   . Anxiety   . Hypertension     No past surgical history on file.  Social History  Substance Use Topics  . Smoking status: Never Smoker   . Smokeless tobacco: None  . Alcohol Use: No    No family history on file.  No Known Allergies  Medication list has been reviewed and updated.  Current Outpatient Prescriptions on File Prior to Visit  Medication Sig Dispense Refill  . amLODipine (NORVASC) 10 MG tablet  Take 1 tablet (10 mg total) by mouth daily. (Patient not taking: Reported on 04/03/2015) 90 tablet 1  . lisinopril-hydrochlorothiazide (PRINZIDE,ZESTORETIC) 20-25 MG per tablet Take 1 tablet by mouth daily. (Patient not taking: Reported on 04/03/2015) 90 tablet 1  . propranolol ER (INDERAL LA) 80 MG 24 hr capsule Take 1 capsule (80 mg total) by mouth daily. (Patient not taking: Reported on 04/03/2015) 90 capsule 1   No current facility-administered medications on file prior to visit.    Review of Systems:  As per HPI- otherwise negative.   Physical Examination: Filed Vitals:   04/03/15 0839  BP: 154/94  Pulse: 76  Temp: 98.8 F (37.1 C)  Resp: 17   Filed Vitals:   04/03/15 0839  Height: 5' 9.5" (1.765 m)  Weight: 206 lb (93.441 kg)   Body mass index is 29.99 kg/(m^2). Ideal Body Weight: Weight in (lb) to have BMI = 25: 171.4  GEN: WDWN, NAD, Non-toxic, A & O x 3, mild overweight, looks well HEENT: Atraumatic, Normocephalic. Neck supple. No masses, No LAD.  Bilateral TM wnl, oropharynx normal.  PEERL,EOMI.   Ears and Nose: No external deformity. CV: RRR, No M/G/R. No JVD. No thrill. No extra heart sounds. PULM: CTA B,  no wheezes, crackles, rhonchi. No retractions. No resp. distress. No accessory muscle use. ABD: S, NT, ND. No rebound. No HSM. EXTR: No c/c/e NEURO Normal gait.  PSYCH: Normally interactive. Conversant. Not depressed or anxious appearing.  Calm demeanor.  Normal testes and penis   Assessment and Plan: Physical exam  Screening for prostate cancer - Plan: PSA  Screening for deficiency anemia - Plan: CBC  Screening for hyperlipidemia - Plan: Lipid panel  Screening for diabetes mellitus - Plan: Comprehensive metabolic panel, Hemoglobin A1c  Other male erectile dysfunction - Plan: Testosterone, Free, Total, SHBG  Essential hypertension - Plan: amLODipine (NORVASC) 10 MG tablet  Will start him back on amlodipine 5mg - increase to 10 as needed OW he is doing  well, await labs, encouraged colonoscopy   Signed Lamar Blinks, MD

## 2015-04-03 NOTE — Patient Instructions (Signed)
It was great to see you today- I will be in touch with your labs For your blood pressure, let's have you start back on the amlodipine.  Start with 1/2 of the 10 mg tablet (5mg ).  After 2 weeks please check your blood pressure.  If it is still higher than 140/90 go to a whole tablet.   Please see Kevin Jarvis in about 6 months for a blood pressure check It is also time to schedule a colonoscopy this is a test to check for colon cancer that we start at age 50.  This test can be scheduled with the Gastroenterologist of your choice- such as [facility] or [facility]

## 2015-04-04 ENCOUNTER — Encounter: Payer: Self-pay | Admitting: Family Medicine

## 2015-04-04 LAB — TESTOSTERONE, FREE, TOTAL, SHBG
SEX HORMONE BINDING: 29 nmol/L (ref 10–50)
TESTOSTERONE: 345 ng/dL (ref 300–890)
Testosterone, Free: 74.1 pg/mL (ref 47.0–244.0)
Testosterone-% Free: 2.1 % (ref 1.6–2.9)

## 2016-08-10 ENCOUNTER — Ambulatory Visit (INDEPENDENT_AMBULATORY_CARE_PROVIDER_SITE_OTHER): Payer: 59 | Admitting: Physician Assistant

## 2016-08-10 ENCOUNTER — Encounter: Payer: Self-pay | Admitting: Physician Assistant

## 2016-08-10 VITALS — BP 183/81 | HR 97 | Temp 101.4°F | Resp 18 | Ht 69.4 in | Wt 207.0 lb

## 2016-08-10 DIAGNOSIS — R6889 Other general symptoms and signs: Secondary | ICD-10-CM | POA: Diagnosis not present

## 2016-08-10 DIAGNOSIS — R509 Fever, unspecified: Secondary | ICD-10-CM

## 2016-08-10 DIAGNOSIS — I10 Essential (primary) hypertension: Secondary | ICD-10-CM | POA: Diagnosis not present

## 2016-08-10 LAB — POC INFLUENZA A&B (BINAX/QUICKVUE)
Influenza A, POC: NEGATIVE
Influenza B, POC: NEGATIVE

## 2016-08-10 MED ORDER — AMLODIPINE BESYLATE 10 MG PO TABS: 10.0000 mg | ORAL_TABLET | Freq: Every day | ORAL | 0 refills | Status: DC

## 2016-08-10 NOTE — Progress Notes (Signed)
Kevin Jarvis  MRN: [id number] DOB: January 22, 1966  PCP: Shawnee Knapp, MD  Chief Complaint  Patient presents with  . URI    C/O headache, loss of appetite, chills, & fever x 4 days    Subjective:  Pt presents to clinic for 4 days of feeling cold with a headache and some joint pain.  Decreased appetite.  He has taken some tylenol - last dose was 12 hours ago while at work.  He has no congestion or cough but does have a headache that is dull.  No dizziness or GI complaints other than no appetite today.  Went to [place] - went for 10 days ago returned on 4/10 and has felt fine since then.  No flu vaccine this year.  No tick bites known.  No rashes recently.  He has been off his Norvasc - he got tired of taking it but he plans to go back on it since his BP was taken during triage.  Review of Systems  Constitutional: Positive for chills and fever.  HENT: Negative for congestion and sore throat.   Respiratory: Negative for cough.   Gastrointestinal: Negative for abdominal pain, diarrhea, nausea and vomiting.  Genitourinary: Negative.   Neurological: Positive for headaches.    Patient Active Problem List   Diagnosis Date Noted  . Anxiety 12/12/2011  . Long term use of drug 12/12/2011  . Hypertension 12/12/2011  . 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 Headache(784.0) 12/12/2011    No current outpatient prescriptions on file prior to visit.   No current facility-administered medications on file prior to visit.     No Known Allergies  Pt patients past, family and social history were reviewed and updated.   Objective:  BP (!) 183/81   Pulse 97   Temp (!) 101.4 F (38.6 C) (Oral)   Resp 18   Ht 5' 9.4" (1.763 m)   Wt 207 lb (93.9 kg)   SpO2 97%   BMI 30.22 kg/m   Physical Exam  Constitutional: He is oriented to person, place, and time and well-developed, well-nourished, and in no distress.  HENT:  Head: Normocephalic and atraumatic.  Right Ear: Hearing,  tympanic membrane, external ear and ear canal normal.  Left Ear: Hearing, tympanic membrane, external ear and ear canal normal.  Nose: Nose normal.  Mouth/Throat: Uvula is midline, oropharynx is clear and moist and mucous membranes are normal.  Eyes: Conjunctivae are normal.  Neck: Normal range of motion.  Cardiovascular: Normal rate and regular rhythm.   Murmur heard.  Systolic murmur is present with a grade of 1/6  Pulmonary/Chest: Effort normal and breath sounds normal. He has no wheezes.  Lymphadenopathy:       Head (right side): No tonsillar adenopathy present.       Head (left side): No tonsillar adenopathy present.    He has no cervical adenopathy.       Right: No supraclavicular adenopathy present.       Left: No supraclavicular adenopathy present.  Neurological: He is alert and oriented to person, place, and time. Gait normal.  Skin: Skin is warm and dry.  Psychiatric: Mood, memory, affect and judgment normal.   Results for orders placed or performed in visit on 08/10/16  POC Influenza A&B(BINAX/QUICKVUE)  Result Value Ref Range   Influenza A, POC Negative Negative   Influenza B, POC Negative Negative    Assessment and Plan :  Flu-like symptoms - Plan: POC Influenza A&B(BINAX/QUICKVUE)  Fever, unspecified - pt  does not have the flu and he has normal exam - he will treat the fever and makes sure he states hydrated.  He will treat any cold symptoms that start.  Essential hypertension - Plan: amLODipine (NORVASC) 10 MG tablet - pt will restart this medication and f/u within a month for a recheck and re-evaluation of his HTN control.  Windell Hummingbird PA-C  Primary Care at [place] 08/12/2016 9:04 PM

## 2016-08-10 NOTE — Patient Instructions (Addendum)
  Tylenol -   Motrin (ibuprofen) - 200mg  - take 4 pills 3x/day - please take with a little bite of food   IF you received an x-ray today, you will receive an invoice from [facility]. Please contact [facility] at [phone] with questions or concerns regarding your invoice.   IF you received labwork today, you will receive an invoice from [facility]. Please contact [facility] at [phone] with questions or concerns regarding your invoice.   Our billing staff will not be able to assist you with questions regarding bills from these companies.  You will be contacted with the lab results as soon as they are available. The fastest way to get your results is to activate your My Chart account. Instructions are located on the last page of this paperwork. If you have not heard from [place] regarding the results in 2 weeks, please contact this office.

## 2016-09-12 ENCOUNTER — Other Ambulatory Visit: Payer: Self-pay | Admitting: Physician Assistant

## 2016-09-12 DIAGNOSIS — I10 Essential (primary) hypertension: Secondary | ICD-10-CM

## 2016-09-12 NOTE — Telephone Encounter (Signed)
PATIENT IS CALLING TO GET A REFILL ON HIS AMLODIPINE (NORVASC) 10 MG. HE SAID HE HAS TAKEN HIS LAST PILL. BEST PHONE [phone] (CELL) [place]. [place]

## 2016-09-19 ENCOUNTER — Ambulatory Visit: Payer: 59 | Admitting: Family Medicine

## 2016-09-28 ENCOUNTER — Encounter: Payer: Self-pay | Admitting: Family Medicine

## 2016-09-28 ENCOUNTER — Ambulatory Visit (INDEPENDENT_AMBULATORY_CARE_PROVIDER_SITE_OTHER): Payer: 59 | Admitting: Family Medicine

## 2016-09-28 VITALS — BP 175/89 | HR 75 | Temp 98.4°F | Resp 17 | Ht 69.0 in | Wt 205.0 lb

## 2016-09-28 DIAGNOSIS — R011 Cardiac murmur, unspecified: Secondary | ICD-10-CM

## 2016-09-28 DIAGNOSIS — R9431 Abnormal electrocardiogram [ECG] [EKG]: Secondary | ICD-10-CM | POA: Diagnosis not present

## 2016-09-28 DIAGNOSIS — G4726 Circadian rhythm sleep disorder, shift work type: Secondary | ICD-10-CM | POA: Diagnosis not present

## 2016-09-28 DIAGNOSIS — I10 Essential (primary) hypertension: Secondary | ICD-10-CM | POA: Diagnosis not present

## 2016-09-28 LAB — POCT URINALYSIS DIP (MANUAL ENTRY)
BILIRUBIN UA: NEGATIVE
BILIRUBIN UA: NEGATIVE mg/dL
GLUCOSE UA: NEGATIVE mg/dL
LEUKOCYTES UA: NEGATIVE
Nitrite, UA: NEGATIVE
Protein Ur, POC: NEGATIVE mg/dL
Spec Grav, UA: 1.015 (ref 1.010–1.025)
Urobilinogen, UA: 0.2 E.U./dL
pH, UA: 7 (ref 5.0–8.0)

## 2016-09-28 MED ORDER — AMLODIPINE-VALSARTAN-HCTZ 10-320-25 MG PO TABS: 1.0000 | ORAL_TABLET | Freq: Every day | ORAL | 0 refills | Status: DC

## 2016-09-28 MED ORDER — TRAZODONE HCL 50 MG PO TABS: 25.0000 mg | ORAL_TABLET | Freq: Every evening | ORAL | 3 refills | Status: DC | PRN

## 2016-09-28 NOTE — Progress Notes (Signed)
Subjective:  By signing my name below, I, Kevin Jarvis, attest that this documentation has been prepared under the direction and in the presence of Kevin Cheadle, MD. Electronically Signed: Moises Jarvis, [place]. 09/28/2016 , 9:08 AM .  Patient was seen in Room 2 .   Patient ID: Kevin Jarvis, male    DOB: Jan 03, 1966, 51 y.o.   MRN: [id number] Chief Complaint  Patient presents with  . Jarvis Pressure Check    BP F/U   HPI Kevin Jarvis is a 51 y.o. male who presents to Primary Care at [facility] for follow up on HTN. He has not been seen for his HTN since his last physical 1.5 years ago. When he was here with the flu 6 weeks ago, his BP was elevated in the 180s/80. He had been off of his Norvasc because he was tired of taking it; however, he did agree to restart it at that time. He is fasting today; last meal at around 1:00AM.   He was previously on lisinopril-HCTZ 20-25mg  and propranolol 80mg . On that regime, his BP still ran up to 190/100 in the office. When he was seen for his full physical 18 months ago, he was not on any medications and his BP was the lowest yet at 150/90.   Patient states he's been taking his Norvasc. He usually takes his medication when he goes to work at night. He last took it at 6:00PM last night. He denies any swelling in his legs, headaches, vision changes, chest pain, chest pressure, or shortness of breath with exertion. He rarely checks his BP outside of the office, but notes his BP remains pretty high in the 160-170s/80s. He denies sinus pressure or pain.   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 It's hard for him to sleep during the day. He's taken medication in the past to help with sleep and would like to restart it again.   Past Medical History:  Diagnosis Date  . Anxiety   . Arthritis   . Hypertension    Prior to Admission medications   Medication Sig Start Date End Date Taking? Authorizing Provider  amLODipine (NORVASC) 10 MG  tablet TAKE 1 TABLET BY MOUTH ONCE DAILY 09/12/16  Yes Weber, Sarah L, PA-C   No Known Allergies   No past surgical history on file.  No family history on file. Social History   Social History  . Marital status: Single    Spouse name: N/A  . Number of children: N/A  . Years of education: N/A   Social History Main Topics  . Smoking status: Never Smoker  . Smokeless tobacco: Never Used  . Alcohol use No  . Drug use: No  . Sexual activity: Not Asked   Other Topics Concern  . None   Social History Narrative   From [place] - Moved to [place] 2000   Lives with wife and son   Depression screen [facility] 2/9 09/28/2016 08/10/2016 04/03/2015 04/15/2014 10/15/2013  Decreased Interest 0 0 0 0 0  Down, Depressed, Hopeless 0 0 0 0 0  PHQ - 2 Score 0 0 0 0 0     Review of Systems  Constitutional: Negative for fatigue and unexpected weight change.  Eyes: Negative for visual disturbance.  Respiratory: Negative for cough, chest tightness and shortness of breath.   Cardiovascular: Negative for chest pain, palpitations and leg swelling.  Gastrointestinal: Negative for abdominal pain and Jarvis in stool.  Neurological: Negative for  dizziness, light-headedness and headaches.       Objective:   Physical Exam  Constitutional: He is oriented to person, place, and time. He appears well-developed and well-nourished. No distress.  HENT:  Head: Normocephalic and atraumatic.  Right Ear: Tympanic membrane normal.  Left Ear: Tympanic membrane is erythematous.  Nose: Mucosal edema (left nare with erythema and edema) present.  Mouth/Throat: Oropharynx is clear and moist.  Geographic tongue  Eyes: EOM are normal. Pupils are equal, round, and reactive to light.  Neck: Neck supple. No thyromegaly present.  Cardiovascular: Normal rate.   Murmur heard.  Systolic (left upper sternal border; first noted on physical 6 weeks prior) murmur is present with a grade of 2/6  Pulses:      Posterior tibial pulses are 2+  on the right side, and 2+ on the left side.  Pulmonary/Chest: Effort normal. No respiratory distress.  Musculoskeletal: Normal range of motion.  Lymphadenopathy:       Right: No supraclavicular adenopathy present.       Left: No supraclavicular adenopathy present.  Neurological: He is alert and oriented to person, place, and time.  Skin: Skin is warm and dry.  Psychiatric: He has a normal mood and affect. His behavior is normal.  Nursing note and vitals reviewed.   BP (!) 174/83   Pulse 75   Temp 98.4 F (36.9 C) (Oral)   Resp 17   Ht 5\' 9"  (1.753 m)   Wt 205 lb (93 kg)   SpO2 99%   BMI 30.27 kg/m   EKG: normal sinus rhythm with inverted T-waves on lateral chest leads, and atrial enlargement; both of which were present on EKG done 08/05/2013, no significant changes.     Assessment & Plan:   1. Essential hypertension - minimal to no improvement since restarting amlodipine 10 last mo. Did fine on losartan-hctz last yr but bp was still not at goal at 539 systolic so will add on valsartan 320 and hctz 25 now.  Pt has h/o poor compliance with f/u and long-term med compliance but might be more willing to get BP controlled now that he has had a son (14 mos old). Pt denies white coat HTN  2. Newly recognized heart murmur - refer to cardiology  3. Sleep disorder, shift work - not a sig concern currently but did well on trazodone prior so provided refill  4. Abnormal EKG - but unchanged form prior   Pt plans to sched CPE soon - does not need to be fasting.  Orders Placed This Encounter  Procedures  . Comprehensive metabolic panel    Order Specific Question:   Has the patient fasted?    Answer:   Yes  . Lipid panel    Order Specific Question:   Has the patient fasted?    Answer:   Yes  . Ambulatory referral to Cardiology    Referral Priority:   Routine    Referral Type:   Consultation    Referral Reason:   Specialty Services Required    Requested Specialty:   Cardiology    Number  of Visits Requested:   1  . Care order/instruction:    AVS printed - let patient go!  Marland Kitchen POCT urinalysis dipstick  . EKG 12-Lead    Meds ordered this encounter  Medications  . Amlodipine-Valsartan-HCTZ 10-320-25 MG TABS    Sig: Take 1 tablet by mouth daily.    Dispense:  90 tablet    Refill:  0  . traZODone (  DESYREL) 50 MG tablet    Sig: Take 0.5-1 tablets (25-50 mg total) by mouth at bedtime as needed for sleep.    Dispense:  30 tablet    Refill:  3    I personally performed the services described in this documentation, which was scribed in my presence. The recorded information has been reviewed and considered, and addended by me as needed.   Kevin Jarvis, M.D.  Primary Care at [facility] [address] [place], [place] [address] [phone] phone [phone] fax  09/30/16 12:02 AM

## 2016-09-28 NOTE — Progress Notes (Signed)
y

## 2016-09-28 NOTE — Patient Instructions (Addendum)
Stop your amlodipine as this medication is combined into your new pill which is amlodipine + a medication similar to what you were on prior.  Your EKG is abnormal but it is not significantly changed from the one we did several years ago. However you do have a new murmur now which has persisted since your last visit when your L6 weeks ago. Because prolonged uncontrolled high blood pressure can cause changes in your heart that have the potential to lead to heart failure in addition to heart disease, I do think you should be seen by a cardiologist and at least have an ultrasound of your heart muscle to see how it is functioning as with certain types of changes, we would want to start specific medications that would help it function better.   The cardiologist office will call you to set up an appointment.     IF you received an x-ray today, you will receive an invoice from [facility]. Please contact [facility] at [phone] with questions or concerns regarding your invoice.   IF you received labwork today, you will receive an invoice from [facility]. Please contact [facility] at [phone] with questions or concerns regarding your invoice.   Our billing staff will not be able to assist you with questions regarding bills from these companies.  You will be contacted with the lab results as soon as they are available. The fastest way to get your results is to activate your My Chart account. Instructions are located on the last page of this paperwork. If you have not heard from [place] regarding the results in 2 weeks, please contact this office.      Managing Your Hypertension Hypertension is commonly called high blood pressure. This is when the force of your blood pressing against the walls of your arteries is too strong. Arteries are blood vessels that carry blood from your heart throughout your body. Hypertension forces the heart to work harder to pump blood, and may cause the arteries  to become narrow or stiff. Having untreated or uncontrolled hypertension can cause heart attack, stroke, kidney disease, and other problems. What are blood pressure readings? A blood pressure reading consists of a higher number over a lower number. Ideally, your blood pressure should be below 120/80. The first ("top") number is called the systolic pressure. It is a measure of the pressure in your arteries as your heart beats. The second ("bottom") number is called the diastolic pressure. It is a measure of the pressure in your arteries as the heart relaxes. What does my blood pressure reading mean? Blood pressure is classified into four stages. Based on your blood pressure reading, your health care provider may use the following stages to determine what type of treatment you need, if any. Systolic pressure and diastolic pressure are measured in a unit called mm Hg. Normal  Systolic pressure: below 423.  Diastolic pressure: below 80. Elevated  Systolic pressure: 536-144.  Diastolic pressure: below 80. Hypertension stage 1  Systolic pressure: 315-400.  Diastolic pressure: 86-76. Hypertension stage 2  Systolic pressure: 195 or above.  Diastolic pressure: 90 or above. What health risks are associated with hypertension? Managing your hypertension is an important responsibility. Uncontrolled hypertension can lead to:  A heart attack.  A stroke.  A weakened blood vessel (aneurysm).  Heart failure.  Kidney damage.  Eye damage.  Metabolic syndrome.  Memory and concentration problems.  What changes can I make to manage my hypertension? Hypertension can be managed by making lifestyle changes  and possibly by taking medicines. Your health care provider will help you make a plan to bring your blood pressure within a normal range. Eating and drinking  Eat a diet that is high in fiber and potassium, and low in salt (sodium), added sugar, and fat. An example eating plan is called the  DASH (Dietary Approaches to Stop Hypertension) diet. To eat this way: ? Eat plenty of fresh fruits and vegetables. Try to fill half of your plate at each meal with fruits and vegetables. ? Eat whole grains, such as whole wheat pasta, brown rice, or whole grain bread. Fill about one quarter of your plate with whole grains. ? Eat low-fat diary products. ? Avoid fatty cuts of meat, processed or cured meats, and poultry with skin. Fill about one quarter of your plate with lean proteins such as fish, chicken without skin, beans, eggs, and tofu. ? Avoid premade and processed foods. These tend to be higher in sodium, added sugar, and fat.  Reduce your daily sodium intake. Most people with hypertension should eat less than 1,500 mg of sodium a day.  Limit alcohol intake to no more than 1 drink a day for nonpregnant women and 2 drinks a day for men. One drink equals 12 oz of beer, 5 oz of wine, or 1 oz of hard liquor. Lifestyle  Work with your health care provider to maintain a healthy body weight, or to lose weight. Ask what an ideal weight is for you.  Get at least 30 minutes of exercise that causes your heart to beat faster (aerobic exercise) most days of the week. Activities may include walking, swimming, or biking.  Include exercise to strengthen your muscles (resistance exercise), such as weight lifting, as part of your weekly exercise routine. Try to do these types of exercises for 30 minutes at least 3 days a week.  Do not use any products that contain nicotine or tobacco, such as cigarettes and e-cigarettes. If you need help quitting, ask your health care provider.  Control any long-term (chronic) conditions you have, such as high cholesterol or diabetes. Monitoring  Monitor your blood pressure at home as told by your health care provider. Your personal target blood pressure may vary depending on your medical conditions, your age, and other factors.  Have your blood pressure checked  regularly, as often as told by your health care provider. Working with your health care provider  Review all the medicines you take with your health care provider because there may be side effects or interactions.  Talk with your health care provider about your diet, exercise habits, and other lifestyle factors that may be contributing to hypertension.  Visit your health care provider regularly. Your health care provider can help you create and adjust your plan for managing hypertension. Will I need medicine to control my blood pressure? Your health care provider may prescribe medicine if lifestyle changes are not enough to get your blood pressure under control, and if:  Your systolic blood pressure is 130 or higher.  Your diastolic blood pressure is 80 or higher.  Take medicines only as told by your health care provider. Follow the directions carefully. Blood pressure medicines must be taken as prescribed. The medicine does not work as well when you skip doses. Skipping doses also puts you at risk for problems. Contact a health care provider if:  You think you are having a reaction to medicines you have taken.  You have repeated (recurrent) headaches.  You feel dizzy.  You have swelling in your ankles.  You have trouble with your vision. Get help right away if:  You develop a severe headache or confusion.  You have unusual weakness or numbness, or you feel faint.  You have severe pain in your chest or abdomen.  You vomit repeatedly.  You have trouble breathing. Summary  Hypertension is when the force of blood pumping through your arteries is too strong. If this condition is not controlled, it may put you at risk for serious complications.  Your personal target blood pressure may vary depending on your medical conditions, your age, and other factors. For most people, a normal blood pressure is less than 120/80.  Hypertension is managed by lifestyle changes, medicines, or  both. Lifestyle changes include weight loss, eating a healthy, low-sodium diet, exercising more, and limiting alcohol. This information is not intended to replace advice given to you by your health care provider. Make sure you discuss any questions you have with your health care provider. Document Released: 12/11/2011 Document Revised: 02/14/2016 Document Reviewed: 02/14/2016 Elsevier Interactive Patient Education  2018 [place] Eating Plan DASH stands for "Dietary Approaches to Stop Hypertension." The DASH eating plan is a healthy eating plan that has been shown to reduce high blood pressure (hypertension). It may also reduce your risk for type 2 diabetes, heart disease, and stroke. The DASH eating plan may also help with weight loss. What are tips for following this plan? General guidelines  Avoid eating more than 2,300 mg (milligrams) of salt (sodium) a day. If you have hypertension, you may need to reduce your sodium intake to 1,500 mg a day.  Limit alcohol intake to no more than 1 drink a day for nonpregnant women and 2 drinks a day for men. One drink equals 12 oz of beer, 5 oz of wine, or 1 oz of hard liquor.  Work with your health care provider to maintain a healthy body weight or to lose weight. Ask what an ideal weight is for you.  Get at least 30 minutes of exercise that causes your heart to beat faster (aerobic exercise) most days of the week. Activities may include walking, swimming, or biking.  Work with your health care provider or diet and nutrition specialist (dietitian) to adjust your eating plan to your individual calorie needs. Reading food labels  Check food labels for the amount of sodium per serving. Choose foods with less than 5 percent of the Daily Value of sodium. Generally, foods with less than 300 mg of sodium per serving fit into this eating plan.  To find whole grains, look for the word "whole" as the first word in the ingredient list. Shopping  Buy  products labeled as "low-sodium" or "no salt added."  Buy fresh foods. Avoid canned foods and premade or frozen meals. Cooking  Avoid adding salt when cooking. Use salt-free seasonings or herbs instead of table salt or sea salt. Check with your health care provider or pharmacist before using salt substitutes.  Do not fry foods. Cook foods using healthy methods such as baking, boiling, grilling, and broiling instead.  Cook with heart-healthy oils, such as olive, canola, soybean, or sunflower oil. Meal planning   Eat a balanced diet that includes: ? 5 or more servings of fruits and vegetables each day. At each meal, try to fill half of your plate with fruits and vegetables. ? Up to 6-8 servings of whole grains each day. ? Less than 6 oz of lean meat, poultry, or  fish each day. A 3-oz serving of meat is about the same size as a deck of cards. One egg equals 1 oz. ? 2 servings of low-fat dairy each day. ? A serving of nuts, seeds, or beans 5 times each week. ? Heart-healthy fats. Healthy fats called Omega-3 fatty acids are found in foods such as flaxseeds and coldwater fish, like sardines, salmon, and mackerel.  Limit how much you eat of the following: ? Canned or prepackaged foods. ? Food that is high in trans fat, such as fried foods. ? Food that is high in saturated fat, such as fatty meat. ? Sweets, desserts, sugary drinks, and other foods with added sugar. ? Full-fat dairy products.  Do not salt foods before eating.  Try to eat at least 2 vegetarian meals each week.  Eat more home-cooked food and less restaurant, buffet, and fast food.  When eating at a restaurant, ask that your food be prepared with less salt or no salt, if possible. What foods are recommended? The items listed may not be a complete list. Talk with your dietitian about what dietary choices are best for you. Grains Whole-grain or whole-wheat bread. Whole-grain or whole-wheat pasta. Brown rice. [place].  Bulgur. Whole-grain and low-sodium cereals. Pita bread. Low-fat, low-sodium crackers. Whole-wheat flour tortillas. Vegetables Fresh or frozen vegetables (raw, steamed, roasted, or grilled). Low-sodium or reduced-sodium tomato and vegetable juice. Low-sodium or reduced-sodium tomato sauce and tomato paste. Low-sodium or reduced-sodium canned vegetables. Fruits All fresh, dried, or frozen fruit. Canned fruit in natural juice (without added sugar). Meat and other protein foods Skinless chicken or [place]. Ground chicken or [place]. Pork with fat trimmed off. Fish and seafood. Egg whites. Dried beans, peas, or lentils. Unsalted nuts, nut butters, and seeds. Unsalted canned beans. Lean cuts of beef with fat trimmed off. Low-sodium, lean deli meat. Dairy Low-fat (1%) or fat-free (skim) milk. Fat-free, low-fat, or reduced-fat cheeses. Nonfat, low-sodium ricotta or cottage cheese. Low-fat or nonfat yogurt. Low-fat, low-sodium cheese. Fats and oils Soft margarine without trans fats. Vegetable oil. Low-fat, reduced-fat, or light mayonnaise and salad dressings (reduced-sodium). Canola, safflower, olive, soybean, and sunflower oils. Avocado. Seasoning and other foods Herbs. Spices. Seasoning mixes without salt. Unsalted popcorn and pretzels. Fat-free sweets. What foods are not recommended? The items listed may not be a complete list. Talk with your dietitian about what dietary choices are best for you. Grains Baked goods made with fat, such as croissants, muffins, or some breads. Dry pasta or rice meal packs. Vegetables Creamed or fried vegetables. Vegetables in a cheese sauce. Regular canned vegetables (not low-sodium or reduced-sodium). Regular canned tomato sauce and paste (not low-sodium or reduced-sodium). Regular tomato and vegetable juice (not low-sodium or reduced-sodium). Angie Fava. Olives. Fruits Canned fruit in a light or heavy syrup. Fried fruit. Fruit in cream or butter sauce. Meat and other  protein foods Fatty cuts of meat. Ribs. Fried meat. Berniece Salines. Sausage. Bologna and other processed lunch meats. Salami. Fatback. Hotdogs. Bratwurst. Salted nuts and seeds. Canned beans with added salt. Canned or smoked fish. Whole eggs or egg yolks. Chicken or [place] with skin. Dairy Whole or 2% milk, cream, and half-and-half. Whole or full-fat cream cheese. Whole-fat or sweetened yogurt. Full-fat cheese. Nondairy creamers. Whipped toppings. Processed cheese and cheese spreads. Fats and oils Butter. Stick margarine. Lard. Shortening. Ghee. Bacon fat. Tropical oils, such as coconut, palm kernel, or palm oil. Seasoning and other foods Salted popcorn and pretzels. Onion salt, garlic salt, seasoned salt, table salt, and sea salt.  Worcestershire sauce. Tartar sauce. Barbecue sauce. Teriyaki sauce. Soy sauce, including reduced-sodium. Steak sauce. Canned and packaged gravies. Fish sauce. Oyster sauce. Cocktail sauce. Horseradish that you find on the shelf. Ketchup. Mustard. Meat flavorings and tenderizers. Bouillon cubes. Hot sauce and Tabasco sauce. Premade or packaged marinades. Premade or packaged taco seasonings. Relishes. Regular salad dressings. Where to find more information:  National Heart, Lung, and [place]: https://wilson-eaton.com/  American Heart Association: www.heart.org Summary  The DASH eating plan is a healthy eating plan that has been shown to reduce high blood pressure (hypertension). It may also reduce your risk for type 2 diabetes, heart disease, and stroke.  With the DASH eating plan, you should limit salt (sodium) intake to 2,300 mg a day. If you have hypertension, you may need to reduce your sodium intake to 1,500 mg a day.  When on the DASH eating plan, aim to eat more fresh fruits and vegetables, whole grains, lean proteins, low-fat dairy, and heart-healthy fats.  Work with your health care provider or diet and nutrition specialist (dietitian) to adjust your eating plan to your  individual calorie needs. This information is not intended to replace advice given to you by your health care provider. Make sure you discuss any questions you have with your health care provider. Document Released: 03/07/2011 Document Revised: 03/11/2016 Document Reviewed: 03/11/2016 Elsevier Interactive Patient Education  2017 [employer].

## 2016-09-29 LAB — LIPID PANEL
CHOL/HDL RATIO: 2.6 ratio (ref 0.0–5.0)
Cholesterol, Total: 115 mg/dL (ref 100–199)
HDL: 45 mg/dL (ref >39)
LDL CALC: 61 mg/dL (ref 0–99)
TRIGLYCERIDES: 44 mg/dL (ref 0–149)
VLDL Cholesterol Cal: 9 mg/dL (ref 5–40)

## 2016-09-29 LAB — COMPREHENSIVE METABOLIC PANEL
A/G RATIO: 1.7 (ref 1.2–2.2)
ALBUMIN: 4.5 g/dL (ref 3.5–5.5)
ALK PHOS: 64 IU/L (ref 39–117)
ALT: 20 IU/L (ref 0–44)
AST: 20 IU/L (ref 0–40)
BUN / CREAT RATIO: 12 (ref 9–20)
BUN: 14 mg/dL (ref 6–24)
Bilirubin Total: 0.3 mg/dL (ref 0.0–1.2)
CO2: 26 mmol/L (ref 20–29)
CREATININE: 1.15 mg/dL (ref 0.76–1.27)
Calcium: 9.8 mg/dL (ref 8.7–10.2)
Chloride: 96 mmol/L (ref 96–106)
GFR calc Af Amer: 84 mL/min/{1.73_m2} (ref >59)
GFR calc non Af Amer: 73 mL/min/{1.73_m2} (ref >59)
GLOBULIN, TOTAL: 2.7 g/dL (ref 1.5–4.5)
Glucose: 102 mg/dL — ABNORMAL HIGH (ref 65–99)
POTASSIUM: 3.6 mmol/L (ref 3.5–5.2)
SODIUM: 138 mmol/L (ref 134–144)
Total Protein: 7.2 g/dL (ref 6.0–8.5)

## 2016-09-30 ENCOUNTER — Telehealth: Payer: Self-pay

## 2016-09-30 NOTE — Telephone Encounter (Signed)
PA started Amlod//val/hctz 10-320-25 mg tab Awaiting Response

## 2016-10-01 NOTE — Telephone Encounter (Signed)
Notified pharmacy of [place] states maximum day supply of 31 and will let pt know.

## 2016-10-01 NOTE — Telephone Encounter (Signed)
PA Amlod/val/hctz Approved

## 2016-10-05 ENCOUNTER — Telehealth: Payer: Self-pay | Admitting: Family Medicine

## 2016-10-05 NOTE — Telephone Encounter (Signed)
PT  CALLING TO LET THE DR KNOW THAT HE'S  HAVING IMPOTENCE BECAUSE OF BLOOD PRESSURE MEDICINE PLEASE RESPOND

## 2016-10-07 NOTE — Telephone Encounter (Signed)
Please advise 

## 2016-10-08 NOTE — Telephone Encounter (Addendum)
Yes, that is a common problem with blood pressure medicine. Normally, changing the medicine isn't going to help much - going to happen whenever his BP stays down at a good level so would recommend considering use of a medication to help increase blood flow to the penis when an erection is desired - like viagra, cialis, etc. Can be expensive but currently with a good-rx coupon can get generic off-label viagra at the equivilant of $3/pill at [facility] (with a rx).  However due to the new heart murmur on exam and his long-standing uncontrolled high blood pressure as well as slightly abnormal EKG, it is important that he sees cardiology first before we start him on one of these medications or further tinker with his blood pressure medications. On 6/30 he was referred to cards and looks like referral was sent to [facility] on [address]. No sign that he has been contacted for an appointment but that office.  \ Let's instead, send this referral over to Dr. Smith Robert office at [facility], please.

## 2016-10-09 NOTE — Telephone Encounter (Signed)
Referral sent to [facility] on 7/11

## 2016-10-10 NOTE — Telephone Encounter (Signed)
Pt advised.

## 2016-10-24 ENCOUNTER — Encounter: Payer: Self-pay | Admitting: Family Medicine

## 2016-10-24 ENCOUNTER — Ambulatory Visit (INDEPENDENT_AMBULATORY_CARE_PROVIDER_SITE_OTHER): Payer: 59 | Admitting: Family Medicine

## 2016-10-24 VITALS — BP 183/91 | HR 84 | Temp 98.4°F | Resp 18 | Ht 69.0 in | Wt 205.0 lb

## 2016-10-24 DIAGNOSIS — Z113 Encounter for screening for infections with a predominantly sexual mode of transmission: Secondary | ICD-10-CM

## 2016-10-24 DIAGNOSIS — Z1211 Encounter for screening for malignant neoplasm of colon: Secondary | ICD-10-CM

## 2016-10-24 DIAGNOSIS — Z0000 Encounter for general adult medical examination without abnormal findings: Secondary | ICD-10-CM | POA: Diagnosis not present

## 2016-10-24 DIAGNOSIS — Z1329 Encounter for screening for other suspected endocrine disorder: Secondary | ICD-10-CM | POA: Diagnosis not present

## 2016-10-24 DIAGNOSIS — Z1212 Encounter for screening for malignant neoplasm of rectum: Secondary | ICD-10-CM | POA: Diagnosis not present

## 2016-10-24 DIAGNOSIS — Z1389 Encounter for screening for other disorder: Secondary | ICD-10-CM | POA: Diagnosis not present

## 2016-10-24 DIAGNOSIS — Z5181 Encounter for therapeutic drug level monitoring: Secondary | ICD-10-CM

## 2016-10-24 DIAGNOSIS — Z125 Encounter for screening for malignant neoplasm of prostate: Secondary | ICD-10-CM

## 2016-10-24 DIAGNOSIS — Z136 Encounter for screening for cardiovascular disorders: Secondary | ICD-10-CM

## 2016-10-24 DIAGNOSIS — I10 Essential (primary) hypertension: Secondary | ICD-10-CM | POA: Diagnosis not present

## 2016-10-24 DIAGNOSIS — Z1383 Encounter for screening for respiratory disorder NEC: Secondary | ICD-10-CM

## 2016-10-24 DIAGNOSIS — Z130 Encounter for screening for diseases of the blood and blood-forming organs and certain disorders involving the immune mechanism: Secondary | ICD-10-CM

## 2016-10-24 LAB — POCT URINALYSIS DIP (MANUAL ENTRY)
Bilirubin, UA: NEGATIVE
Glucose, UA: NEGATIVE mg/dL
Ketones, POC UA: NEGATIVE mg/dL
Leukocytes, UA: NEGATIVE
Nitrite, UA: NEGATIVE
PH UA: 8.5 — AB (ref 5.0–8.0)
Protein Ur, POC: NEGATIVE mg/dL
Spec Grav, UA: 1.01 (ref 1.010–1.025)
UROBILINOGEN UA: 0.2 U/dL

## 2016-10-24 MED ORDER — AMLODIPINE BESYLATE 10 MG PO TABS: 10.0000 mg | ORAL_TABLET | Freq: Every day | ORAL | 3 refills | Status: DC

## 2016-10-24 MED ORDER — TRIAMTERENE-HCTZ 37.5-25 MG PO TABS: 1.0000 | ORAL_TABLET | Freq: Every day | ORAL | 0 refills | Status: DC

## 2016-10-24 NOTE — Patient Instructions (Addendum)
   IF you received an x-ray today, you will receive an invoice from [facility]. Please contact [facility] at [phone] with questions or concerns regarding your invoice.   IF you received labwork today, you will receive an invoice from [facility]. Please contact [facility] at [phone] with questions or concerns regarding your invoice.   Our billing staff will not be able to assist you with questions regarding bills from these companies.  You will be contacted with the lab results as soon as they are available. The fastest way to get your results is to activate your My Chart account. Instructions are located on the last page of this paperwork. If you have not heard from us regarding the results in 2 weeks, please contact this office.      Health Maintenance, Male A healthy lifestyle and preventive care is important for your health and wellness. Ask your health care provider about what schedule of regular examinations is right for you. What should I know about weight and diet? Eat a Healthy Diet  Eat plenty of vegetables, fruits, whole grains, low-fat dairy products, and lean protein.  Do not eat a lot of foods high in solid fats, added sugars, or salt.  Maintain a Healthy Weight Regular exercise can help you achieve or maintain a healthy weight. You should:  Do at least 150 minutes of exercise each week. The exercise should increase your heart rate and make you sweat (moderate-intensity exercise).  Do strength-training exercises at least twice a week.  Watch Your Levels of Cholesterol and Blood Lipids  Have your blood tested for lipids and cholesterol every 5 years starting at 52 years of age. If you are at high risk for heart disease, you should start having your blood tested when you are 52 years old. You may need to have your cholesterol levels checked more often if: ? Your lipid or cholesterol levels are high. ? You are older than 52 years of age. ? You  are at high risk for heart disease.  What should I know about cancer screening? Many types of cancers can be detected early and may often be prevented. Lung Cancer  You should be screened every year for lung cancer if: ? You are a current smoker who has smoked for at least 30 years. ? You are a former smoker who has quit within the past 15 years.  Talk to your health care provider about your screening options, when you should start screening, and how often you should be screened.  Colorectal Cancer  Routine colorectal cancer screening usually begins at 52 years of age and should be repeated every 5-10 years until you are 52 years old. You may need to be screened more often if early forms of precancerous polyps or small growths are found. Your health care provider may recommend screening at an earlier age if you have risk factors for colon cancer.  Your health care provider may recommend using home test kits to check for hidden blood in the stool.  A small camera at the end of a tube can be used to examine your colon (sigmoidoscopy or colonoscopy). This checks for the earliest forms of colorectal cancer.  Prostate and Testicular Cancer  Depending on your age and overall health, your health care provider may do certain tests to screen for prostate and testicular cancer.  Talk to your health care provider about any symptoms or concerns you have about testicular or prostate cancer.  Skin Cancer  Check your skin   from head to toe regularly.  Tell your health care provider about any new moles or changes in moles, especially if: ? There is a change in a mole's size, shape, or color. ? You have a mole that is larger than a pencil eraser.  Always use sunscreen. Apply sunscreen liberally and repeat throughout the day.  Protect yourself by wearing long sleeves, pants, a wide-brimmed hat, and sunglasses when outside.  What should I know about heart disease, diabetes, and high blood  pressure?  If you are 18-39 years of age, have your blood pressure checked every 3-5 years. If you are 40 years of age or older, have your blood pressure checked every year. You should have your blood pressure measured twice-once when you are at a hospital or clinic, and once when you are not at a hospital or clinic. Record the average of the two measurements. To check your blood pressure when you are not at a hospital or clinic, you can use: ? An automated blood pressure machine at a pharmacy. ? A home blood pressure monitor.  Talk to your health care provider about your target blood pressure.  If you are between 45-79 years old, ask your health care provider if you should take aspirin to prevent heart disease.  Have regular diabetes screenings by checking your fasting blood sugar level. ? If you are at a normal weight and have a low risk for diabetes, have this test once every three years after the age of 45. ? If you are overweight and have a high risk for diabetes, consider being tested at a younger age or more often.  A one-time screening for abdominal aortic aneurysm (AAA) by ultrasound is recommended for men aged 65-75 years who are current or former smokers. What should I know about preventing infection? Hepatitis B If you have a higher risk for hepatitis B, you should be screened for this virus. Talk with your health care provider to find out if you are at risk for hepatitis B infection. Hepatitis C Blood testing is recommended for:  Everyone born from 1945 through 1965.  Anyone with known risk factors for hepatitis C.  Sexually Transmitted Diseases (STDs)  You should be screened each year for STDs including gonorrhea and chlamydia if: ? You are sexually active and are younger than 52 years of age. ? You are older than 52 years of age and your health care provider tells you that you are at risk for this type of infection. ? Your sexual activity has changed since you were last  screened and you are at an increased risk for chlamydia or gonorrhea. Ask your health care provider if you are at risk.  Talk with your health care provider about whether you are at high risk of being infected with HIV. Your health care provider may recommend a prescription medicine to help prevent HIV infection.  What else can I do?  Schedule regular health, dental, and eye exams.  Stay current with your vaccines (immunizations).  Do not use any tobacco products, such as cigarettes, chewing tobacco, and e-cigarettes. If you need help quitting, ask your health care provider.  Limit alcohol intake to no more than 2 drinks per day. One drink equals 12 ounces of beer, 5 ounces of wine, or 1 ounces of hard liquor.  Do not use street drugs.  Do not share needles.  Ask your health care provider for help if you need support or information about quitting drugs.  Tell your health care   provider if you often feel depressed.  Tell your health care provider if you have ever been abused or do not feel safe at home. This information is not intended to replace advice given to you by your health care provider. Make sure you discuss any questions you have with your health care provider. Document Released: 09/14/2007 Document Revised: 11/15/2015 Document Reviewed: 12/20/2014 Elsevier Interactive Patient Education  2018 [employer]vier Inc[employer]  

## 2016-10-24 NOTE — Progress Notes (Signed)
Subjective:    Patient ID: Kevin Jarvis, male    DOB: May 27, 1965, 51 y.o.   MRN: [id number]  HPI   He is checking his BP outside of the office and absolutely no symptoms.  He checks his BP and it is very variable.  Did see cardiology prior and was a normal work-up - he has the referral appt for 8/9.     Review of Systems     Objective:   Physical Exam  Constitutional: He is oriented to person, place, and time. He appears well-developed and well-nourished. No distress.  HENT:  Head: Normocephalic and atraumatic.  Right Ear: Tympanic membrane, external ear and ear canal normal.  Left Ear: Tympanic membrane, external ear and ear canal normal.  Nose: Nose normal.  Mouth/Throat: Uvula is midline, oropharynx is clear and moist and mucous membranes are normal. No oropharyngeal exudate.  Eyes: Conjunctivae are normal. Right eye exhibits no discharge. Left eye exhibits no discharge. No scleral icterus.  Neck: Normal range of motion. Neck supple. No thyromegaly present.  Cardiovascular: Normal rate, regular rhythm and intact distal pulses.   Murmur (intermittent, LUSB) heard.  Decrescendo systolic murmur is present with a grade of 1/6  Pulmonary/Chest: Effort normal and breath sounds normal. No respiratory distress.  Abdominal: Soft. Bowel sounds are normal. He exhibits no distension and no mass. There is no tenderness. There is no rebound and no guarding.  Genitourinary: Prostate normal. Rectal exam shows no external hemorrhoid, no internal hemorrhoid, no mass, no tenderness and anal tone normal. Prostate is not enlarged and not tender.  Musculoskeletal: He exhibits no edema.  Lymphadenopathy:    He has no cervical adenopathy.  Neurological: He is alert and oriented to person, place, and time. He has normal reflexes. No cranial nerve deficit. He exhibits normal muscle tone.  Skin: Skin is warm and dry. No rash noted. He is not diaphoretic. No erythema.  Psychiatric: He has a normal mood  and affect. His behavior is normal.      BP (!) 184/83   Pulse 84   Temp 98.4 F (36.9 C) (Oral)   Resp 18   Ht 5\' 9"  (1.753 m)   Wt 205 lb (93 kg)   SpO2 99%   BMI 30.27 kg/m      Assessment & Plan:   1. Annual physical exam   2. Routine screening for STI (sexually transmitted infection)   3. Screening for cardiovascular, respiratory, and genitourinary diseases   4. Screening for colorectal cancer   5. Screening for prostate cancer   6. Screening for deficiency anemia   7. Screening for thyroid disorder   8. Medication monitoring encounter   9. Malignant hypertension     Orders Placed This Encounter  Procedures  . CBC  . Comprehensive metabolic panel  . TSH  . PSA  . Cologuard  . Ambulatory referral to Nephrology    Referral Priority:   Routine    Referral Type:   Consultation    Referral Reason:   Specialty Services Required    Requested Specialty:   Nephrology    Number of Visits Requested:   1  . Care order/instruction:    Scheduling Instructions:     Recheck BP  . POCT urinalysis dipstick    Meds ordered this encounter  Medications  . amLODipine (NORVASC) 10 MG tablet    Sig: Take 1 tablet (10 mg total) by mouth daily.    Dispense:  90 tablet    Refill:  3  D/c all prior rxs for amlodipine containing meds and med combos  . triamterene-hydrochlorothiazide (MAXZIDE-25) 37.5-25 MG tablet    Sig: Take 1 tablet by mouth daily.    Dispense:  90 tablet    Refill:  0     [name], M.D.  Primary Care at [facility] [address] [place], [place] [address] [phone] phone [phone] fax  10/27/16 12:00 AM

## 2016-10-25 LAB — CBC
Hematocrit: 37.7 % (ref 37.5–51.0)
Hemoglobin: 12.3 g/dL — ABNORMAL LOW (ref 13.0–17.7)
MCH: 28 pg (ref 26.6–33.0)
MCHC: 32.6 g/dL (ref 31.5–35.7)
MCV: 86 fL (ref 79–97)
PLATELETS: 278 10*3/uL (ref 150–379)
RBC: 4.39 x10E6/uL (ref 4.14–5.80)
RDW: 13.6 % (ref 12.3–15.4)
WBC: 5.2 10*3/uL (ref 3.4–10.8)

## 2016-10-25 LAB — COMPREHENSIVE METABOLIC PANEL
ALT: 19 IU/L (ref 0–44)
AST: 20 IU/L (ref 0–40)
Albumin/Globulin Ratio: 1.5 (ref 1.2–2.2)
Albumin: 4.8 g/dL (ref 3.5–5.5)
Alkaline Phosphatase: 68 IU/L (ref 39–117)
BUN/Creatinine Ratio: 10 (ref 9–20)
BUN: 12 mg/dL (ref 6–24)
Bilirubin Total: 0.4 mg/dL (ref 0.0–1.2)
CALCIUM: 10.1 mg/dL (ref 8.7–10.2)
CO2: 26 mmol/L (ref 20–29)
Chloride: 100 mmol/L (ref 96–106)
Creatinine, Ser: 1.15 mg/dL (ref 0.76–1.27)
GFR, EST AFRICAN AMERICAN: 84 mL/min/{1.73_m2} (ref >59)
GFR, EST NON AFRICAN AMERICAN: 73 mL/min/{1.73_m2} (ref >59)
GLUCOSE: 111 mg/dL — AB (ref 65–99)
Globulin, Total: 3.1 g/dL (ref 1.5–4.5)
Potassium: 4.3 mmol/L (ref 3.5–5.2)
SODIUM: 143 mmol/L (ref 134–144)
TOTAL PROTEIN: 7.9 g/dL (ref 6.0–8.5)

## 2016-10-25 LAB — TSH: TSH: 2.21 u[IU]/mL (ref 0.450–4.500)

## 2016-10-25 LAB — PSA: Prostate Specific Ag, Serum: 1.4 ng/mL (ref 0.0–4.0)

## 2016-10-28 ENCOUNTER — Telehealth: Payer: Self-pay

## 2016-10-28 NOTE — Telephone Encounter (Signed)
Cologuard order faxed on 07.26.18 confirmation fax received.

## 2016-10-30 ENCOUNTER — Ambulatory Visit (HOSPITAL_COMMUNITY)
Admission: RE | Admit: 2016-10-30 | Discharge: 2016-10-30 | Disposition: A | Discharge status: Home / Self Care | Payer: 59 | Source: Ambulatory Visit | Attending: Family Medicine | Admitting: Family Medicine

## 2016-10-30 DIAGNOSIS — I10 Essential (primary) hypertension: Secondary | ICD-10-CM | POA: Diagnosis present

## 2017-01-25 NOTE — Progress Notes (Addendum)
Subjective:    Patient ID: Kevin Jarvis, male    DOB: 11/05/1965, 51 y.o.   MRN: [id number] Chief Complaint  Patient presents with  . Follow-up    HPI Kevin Jarvis is a delightful 51 yo male here for a 3 mo f/u after his CPE to f/u on his HTN.  HTN: On amlodipine 10 and maxzide 37.5-25. Checks BP outside office. Asymptomatic. Labile and resistant to sev meds. Previously on lisinopril-HCTZ 20-25mg  and propranolol 80mg  on which BP up to 190/100. Later stopped meds as he was tired taking them, and BP in office was the lowest yet at 150/90.  So referred to nephrology at last visit but has not made an appointment as his insurance is >50% of charges but his insurance might change in the past year. Has been running 160s-170s/80s. [facility] cardiology 8/9 due to new murmur. He has been seen there twice and everything came back nml. He had an echocardiogram.  10/30/16 renal artery duplex normal.  Works nights as a Glass blower/designer. 73 yo son so hard to sleep during the day. Uses rare prn trazodone.   Lipids excellent. Last checked 09/28/2016 LDL 61, non-HDL 70. Last EKG 09/28/2016 a1c 5.6 04/2015  Past Medical History:  Diagnosis Date  . Anxiety   . Arthritis   . Hypertension    History reviewed. No pertinent surgical history. Current Outpatient Prescriptions on File Prior to Visit  Medication Sig Dispense Refill  . amLODipine (NORVASC) 10 MG tablet Take 1 tablet (10 mg total) by mouth daily. 90 tablet 3  . traZODone (DESYREL) 50 MG tablet Take 0.5-1 tablets (25-50 mg total) by mouth at bedtime as needed for sleep. 30 tablet 3  . triamterene-hydrochlorothiazide (MAXZIDE-25) 37.5-25 MG tablet Take 1 tablet by mouth daily. 90 tablet 0   No current facility-administered medications on file prior to visit.    No Known Allergies History reviewed. No pertinent family history. Social History   Social History  . Marital status: Single    Spouse name: N/A  . Number of children: N/A  . Years of  education: N/A   Social History Main Topics  . Smoking status: Never Smoker  . Smokeless tobacco: Never Used  . Alcohol use No  . Drug use: No  . Sexual activity: Not Asked   Other Topics Concern  . None   Social History Narrative   From [place] - Moved to [place] 2000   Lives with wife and son   Depression screen [facility] 2/9 01/27/2017 10/24/2016 09/28/2016 08/10/2016 04/03/2015  Decreased Interest 0 0 0 0 0  Down, Depressed, Hopeless 0 0 0 0 0  PHQ - 2 Score 0 0 0 0 0    Review of Systems  Constitutional: Negative for chills and fever.  Eyes: Negative for visual disturbance.  Respiratory: Negative for shortness of breath.   Cardiovascular: Negative for chest pain and leg swelling.  Neurological: Negative for dizziness, syncope, facial asymmetry, weakness, light-headedness and headaches.      Objective:   Physical Exam  Constitutional: He is oriented to person, place, and time. He appears well-developed and well-nourished. No distress.  HENT:  Head: Normocephalic and atraumatic.  Eyes: Pupils are equal, round, and reactive to light. Conjunctivae are normal. No scleral icterus.  Neck: Normal range of motion. Neck supple. No thyromegaly present.  Cardiovascular: Normal rate, regular rhythm, normal heart sounds and intact distal pulses.   Pulmonary/Chest: Effort normal and breath sounds normal. No respiratory distress.  Musculoskeletal: He exhibits no  edema.  Lymphadenopathy:    He has no cervical adenopathy.  Neurological: He is alert and oriented to person, place, and time.  Skin: Skin is warm and dry. He is not diaphoretic.  Psychiatric: He has a normal mood and affect. His behavior is normal.   Echocardiogram 01/02/2017 Nml left ventricular moderate concentric hypertrophy of the left ventricle size..  Grade 2 diastolic dysfunction.  EF 66%. Mild dilatation of left atrium.  Mitral regurgitation without prolapse.  Consider TEE if clinical suspicion high for severe MR. No evidence of  pulmonary hypertension.  Renal Artery duplex 10/30/2016: Normal.  BP (!) 178/80   Pulse 99   Temp 99 F (37.2 C)   Resp 16   Ht 5\' 9"  (1.753 m)   Wt 208 lb (94.3 kg)   SpO2 99%   BMI 30.72 kg/m     Assessment & Plan:  Cons checking cbc - last hgb slightly low - with next labs.  1. Resistant hypertension   Reviewed Dr. Irven Shelling note from 01/09/2017 -patient's blood pressure was controlled despite diuretics and amlodipine so he started Inspra 50 mg every morning and Catapres patch 0.1mg /24hrs continue, change weekly.  Cont Triamterene HCTZ 37.5-25 daily, amlodipine 10 daily, Bystolic 20 daily.  2-3 weeks and cardiology follow-up in 4-6 weeks.  Noted echocardiogram was normal except for stage II diastolic dysfunction related to uncontrolled hypertension.  We did not suspect regurgitation with severe.  EKG.  Hypertensive heart disease.  Consider se that these values may not be accurate rum aldosterone ratio along with random serum cortisol for secondary causes of hypertension as some of patient's blood pressure meds could affect the values.  Stop maxzide since has started on inspra (K-sparing diuretic).  Start irbesartan-hctz.  Cont bystolic, inspra, clonidine - if bp still elevated, will increase clonidine patch to 0.2 qwk. Could consider adding in non-dihydroperidine ccb such as diltiazem if needed? Pt will hopefully be able to see nephrology next yr when he has better insurance coverage. Bmp and cbc at next OV - Recheck in 3 wks.  Meds ordered this encounter  Medications  . Nebivolol HCl (BYSTOLIC) 20 MG TABS    Sig: Take 20 mg by mouth.  . cloNIDine (CATAPRES) 0.1 MG tablet    Sig: Take 0.1 mg by mouth once a week.  Marland Kitchen eplerenone (INSPRA) 50 MG tablet    Sig: Take 50 mg by mouth daily.  . irbesartan-hydrochlorothiazide (AVALIDE) 150-12.5 MG tablet    Sig: Take 2 tablets by mouth daily.    Dispense:  180 tablet    Refill:  0    D/c triamterene-hctz     [name], M.D.  Primary  Care at [facility] [address] [place], [place] [address] [phone] phone [phone] fax  01/29/17 5:43 PM

## 2017-01-27 ENCOUNTER — Encounter: Payer: Self-pay | Admitting: Family Medicine

## 2017-01-27 ENCOUNTER — Ambulatory Visit (INDEPENDENT_AMBULATORY_CARE_PROVIDER_SITE_OTHER): Payer: 59 | Admitting: Family Medicine

## 2017-01-27 VITALS — BP 178/80 | HR 99 | Temp 99.0°F | Resp 16 | Ht 69.0 in | Wt 208.0 lb

## 2017-01-27 DIAGNOSIS — I10 Essential (primary) hypertension: Secondary | ICD-10-CM

## 2017-01-27 MED ORDER — IRBESARTAN-HYDROCHLOROTHIAZIDE 150-12.5 MG PO TABS: 2.0000 | ORAL_TABLET | Freq: Every day | ORAL | 0 refills | Status: DC

## 2017-01-27 NOTE — Patient Instructions (Addendum)
   IF you received an x-ray today, you will receive an invoice from [facility]. Please contact [facility] at [phone] with questions or concerns regarding your invoice.   IF you received labwork today, you will receive an invoice from [facility]. Please contact [facility] at [phone] with questions or concerns regarding your invoice.   Our billing staff will not be able to assist you with questions regarding bills from these companies.  You will be contacted with the lab results as soon as they are available. The fastest way to get your results is to activate your My Chart account. Instructions are located on the last page of this paperwork. If you have not heard from us regarding the results in 2 weeks, please contact this office.     Hypertension Hypertension, commonly called high blood pressure, is when the force of blood pumping through the arteries is too strong. The arteries are the blood vessels that carry blood from the heart throughout the body. Hypertension forces the heart to work harder to pump blood and may cause arteries to become narrow or stiff. Having untreated or uncontrolled hypertension can cause heart attacks, strokes, kidney disease, and other problems. A blood pressure reading consists of a higher number over a lower number. Ideally, your blood pressure should be below 120/80. The first ("top") number is called the systolic pressure. It is a measure of the pressure in your arteries as your heart beats. The second ("bottom") number is called the diastolic pressure. It is a measure of the pressure in your arteries as the heart relaxes. What are the causes? The cause of this condition is not known. What increases the risk? Some risk factors for high blood pressure are under your control. Others are not. Factors you can change  Smoking.  Having type 2 diabetes mellitus, high cholesterol, or both.  Not getting enough exercise or physical  activity.  Being overweight.  Having too much fat, sugar, calories, or salt (sodium) in your diet.  Drinking too much alcohol. Factors that are difficult or impossible to change  Having chronic kidney disease.  Having a family history of high blood pressure.  Age. Risk increases with age.  Race. You may be at higher risk if you are African-American.  Gender. Men are at higher risk than women before age 45. After age 65, women are at higher risk than men.  Having obstructive sleep apnea.  Stress. What are the signs or symptoms? Extremely high blood pressure (hypertensive crisis) may cause:  Headache.  Anxiety.  Shortness of breath.  Nosebleed.  Nausea and vomiting.  Severe chest pain.  Jerky movements you cannot control (seizures).  How is this diagnosed? This condition is diagnosed by measuring your blood pressure while you are seated, with your arm resting on a surface. The cuff of the blood pressure monitor will be placed directly against the skin of your upper arm at the level of your heart. It should be measured at least twice using the same arm. Certain conditions can cause a difference in blood pressure between your right and left arms. Certain factors can cause blood pressure readings to be lower or higher than normal (elevated) for a short period of time:  When your blood pressure is higher when you are in a health care provider's office than when you are at home, this is called white coat hypertension. Most people with this condition do not need medicines.  When your blood pressure is higher at home than when you   are in a health care provider's office, this is called masked hypertension. Most people with this condition may need medicines to control blood pressure.  If you have a high blood pressure reading during one visit or you have normal blood pressure with other risk factors:  You may be asked to return on a different day to have your blood pressure  checked again.  You may be asked to monitor your blood pressure at home for 1 week or longer.  If you are diagnosed with hypertension, you may have other blood or imaging tests to help your health care provider understand your overall risk for other conditions. How is this treated? This condition is treated by making healthy lifestyle changes, such as eating healthy foods, exercising more, and reducing your alcohol intake. Your health care provider may prescribe medicine if lifestyle changes are not enough to get your blood pressure under control, and if:  Your systolic blood pressure is above 130.  Your diastolic blood pressure is above 80.  Your personal target blood pressure may vary depending on your medical conditions, your age, and other factors. Follow these instructions at home: Eating and drinking  Eat a diet that is high in fiber and potassium, and low in sodium, added sugar, and fat. An example eating plan is called the DASH (Dietary Approaches to Stop Hypertension) diet. To eat this way: ? Eat plenty of fresh fruits and vegetables. Try to fill half of your plate at each meal with fruits and vegetables. ? Eat whole grains, such as whole wheat pasta, brown rice, or whole grain bread. Fill about one quarter of your plate with whole grains. ? Eat or drink low-fat dairy products, such as skim milk or low-fat yogurt. ? Avoid fatty cuts of meat, processed or cured meats, and poultry with skin. Fill about one quarter of your plate with lean proteins, such as fish, chicken without skin, beans, eggs, and tofu. ? Avoid premade and processed foods. These tend to be higher in sodium, added sugar, and fat.  Reduce your daily sodium intake. Most people with hypertension should eat less than 1,500 mg of sodium a day.  Limit alcohol intake to no more than 1 drink a day for nonpregnant women and 2 drinks a day for men. One drink equals 12 oz of beer, 5 oz of wine, or 1 oz of hard  liquor. Lifestyle  Work with your health care provider to maintain a healthy body weight or to lose weight. Ask what an ideal weight is for you.  Get at least 30 minutes of exercise that causes your heart to beat faster (aerobic exercise) most days of the week. Activities may include walking, swimming, or biking.  Include exercise to strengthen your muscles (resistance exercise), such as pilates or lifting weights, as part of your weekly exercise routine. Try to do these types of exercises for 30 minutes at least 3 days a week.  Do not use any products that contain nicotine or tobacco, such as cigarettes and e-cigarettes. If you need help quitting, ask your health care provider.  Monitor your blood pressure at home as told by your health care provider.  Keep all follow-up visits as told by your health care provider. This is important. Medicines  Take over-the-counter and prescription medicines only as told by your health care provider. Follow directions carefully. Blood pressure medicines must be taken as prescribed.  Do not skip doses of blood pressure medicine. Doing this puts you at risk for problems and   can make the medicine less effective.  Ask your health care provider about side effects or reactions to medicines that you should watch for. Contact a health care provider if:  You think you are having a reaction to a medicine you are taking.  You have headaches that keep coming back (recurring).  You feel dizzy.  You have swelling in your ankles.  You have trouble with your vision. Get help right away if:  You develop a severe headache or confusion.  You have unusual weakness or numbness.  You feel faint.  You have severe pain in your chest or abdomen.  You vomit repeatedly.  You have trouble breathing. Summary  Hypertension is when the force of blood pumping through your arteries is too strong. If this condition is not controlled, it may put you at risk for serious  complications.  Your personal target blood pressure may vary depending on your medical conditions, your age, and other factors. For most people, a normal blood pressure is less than 120/80.  Hypertension is treated with lifestyle changes, medicines, or a combination of both. Lifestyle changes include weight loss, eating a healthy, low-sodium diet, exercising more, and limiting alcohol. This information is not intended to replace advice given to you by your health care provider. Make sure you discuss any questions you have with your health care provider. Document Released: 03/18/2005 Document Revised: 02/14/2016 Document Reviewed: 02/14/2016 Elsevier Interactive Patient Education  2018 [employer]  

## 2017-02-17 ENCOUNTER — Ambulatory Visit: Payer: 59 | Admitting: Family Medicine

## 2017-04-05 ENCOUNTER — Ambulatory Visit: Payer: Managed Care, Other (non HMO) | Admitting: Family Medicine

## 2017-04-05 ENCOUNTER — Other Ambulatory Visit: Payer: Self-pay

## 2017-04-05 ENCOUNTER — Encounter: Payer: Self-pay | Admitting: Family Medicine

## 2017-04-05 VITALS — BP 148/88 | HR 66 | Temp 98.4°F | Resp 16 | Ht 69.0 in | Wt 212.6 lb

## 2017-04-05 DIAGNOSIS — R1013 Epigastric pain: Secondary | ICD-10-CM | POA: Diagnosis not present

## 2017-04-05 DIAGNOSIS — Z5181 Encounter for therapeutic drug level monitoring: Secondary | ICD-10-CM

## 2017-04-05 DIAGNOSIS — R197 Diarrhea, unspecified: Secondary | ICD-10-CM

## 2017-04-05 DIAGNOSIS — D649 Anemia, unspecified: Secondary | ICD-10-CM | POA: Diagnosis not present

## 2017-04-05 DIAGNOSIS — K219 Gastro-esophageal reflux disease without esophagitis: Secondary | ICD-10-CM

## 2017-04-05 DIAGNOSIS — I10 Essential (primary) hypertension: Secondary | ICD-10-CM

## 2017-04-05 LAB — POCT CBC
Granulocyte percent: 60.4 %G (ref 37–80)
HCT, POC: 37.3 % — AB (ref 43.5–53.7)
HEMOGLOBIN: 12.4 g/dL — AB (ref 14.1–18.1)
Lymph, poc: 1.8 (ref 0.6–3.4)
MCH: 29 pg (ref 27–31.2)
MCHC: 33.3 g/dL (ref 31.8–35.4)
MCV: 87 fL (ref 80–97)
MID (CBC): 0.2 (ref 0–0.9)
MPV: 6.9 fL (ref 0–99.8)
POC Granulocyte: 3 (ref 2–6.9)
POC LYMPH PERCENT: 36 %L (ref 10–50)
POC MID %: 3.6 %M (ref 0–12)
Platelet Count, POC: 276 10*3/uL (ref 142–424)
RBC: 4.28 M/uL — AB (ref 4.69–6.13)
RDW, POC: 12.8 %
WBC: 5 10*3/uL (ref 4.6–10.2)

## 2017-04-05 MED ORDER — IRBESARTAN-HYDROCHLOROTHIAZIDE 150-12.5 MG PO TABS: 2.0000 | ORAL_TABLET | Freq: Every day | ORAL | 1 refills | Status: DC

## 2017-04-05 MED ORDER — DICYCLOMINE HCL 20 MG PO TABS: 20.0000 mg | ORAL_TABLET | Freq: Four times a day (QID) | ORAL | 0 refills | Status: DC | PRN

## 2017-04-05 MED ORDER — OMEPRAZOLE 40 MG PO CPDR: 40.0000 mg | DELAYED_RELEASE_CAPSULE | Freq: Every day | ORAL | 1 refills | Status: DC

## 2017-04-05 NOTE — Progress Notes (Addendum)
Subjective:  By signing my name below, I, Kevin Jarvis, attest that this documentation has been prepared under the direction and in the presence of [name], MD Electronically Signed: Ladene Artist, ED Scribe 04/05/2017 at 9:01 AM.   Patient ID: Kevin Jarvis, male    DOB: [birth year], 52 y.o.   MRN: [id number]  Chief Complaint  Patient presents with  . Heartburn    Patient presents c/o severe heartburn and abdominal concerns after eating and drinking, has gotten more severe over the past seversl weeks.   HPI Kevin Jarvis is a 52 y.o. male who presents to Primary Care at [facility] complaining of gradually worsening heartburn x several weeks. Pt reports gurgling sounds, flatulence, abdominal cramping which reoslves after BMs and frequent loose BMs that occur right after eating and drinking. He has been having ~3-4 BMs within 12 hours. He has been treating symptoms with [place] daily. Denies abdominal distention, fever, chills, antiacid use.  HTN Pt has been checking his BP outside of the office with average readings of 120/70.  Past Medical History:  Diagnosis Date  . Anxiety   . Arthritis   . Hypertension    Current Outpatient Medications on File Prior to Visit  Medication Sig Dispense Refill  . amLODipine (NORVASC) 10 MG tablet Take 1 tablet (10 mg total) by mouth daily. 90 tablet 3  . cloNIDine (CATAPRES) 0.1 MG tablet Take 0.1 mg by mouth once a week.    Marland Kitchen eplerenone (INSPRA) 50 MG tablet Take 50 mg by mouth daily.    . irbesartan-hydrochlorothiazide (AVALIDE) 150-12.5 MG tablet Take 2 tablets by mouth daily. 180 tablet 0  . Nebivolol HCl (BYSTOLIC) 20 MG TABS Take 20 mg by mouth.    . traZODone (DESYREL) 50 MG tablet Take 0.5-1 tablets (25-50 mg total) by mouth at bedtime as needed for sleep. (Patient not taking: Reported on 04/05/2017) 30 tablet 3   No current facility-administered medications on file prior to visit.    No Known Allergies   No past surgical history on  file. No family history on file. Social History   Socioeconomic History  . Marital status: Single    Spouse name: None  . Number of children: None  . Years of education: None  . Highest education level: None  Social Needs  . Financial resource strain: None  . Food insecurity - worry: None  . Food insecurity - inability: None  . Transportation needs - medical: None  . Transportation needs - non-medical: None  Occupational History  . None  Tobacco Use  . Smoking status: Never Smoker  . Smokeless tobacco: Never Used  Substance and Sexual Activity  . Alcohol use: No  . Drug use: No  . Sexual activity: None  Other Topics Concern  . None  Social History Narrative   From [place] - Moved to [place] 2000   Lives with wife and son   Depression screen [facility] 2/9 04/05/2017 01/27/2017 10/24/2016 09/28/2016 08/10/2016  Decreased Interest 0 0 0 0 0  Down, Depressed, Hopeless 0 0 0 0 0  PHQ - 2 Score 0 0 0 0 0    Review of Systems  Constitutional: Negative for chills and fever.  Gastrointestinal: Positive for abdominal pain and diarrhea. Negative for abdominal distention.      Objective:   Physical Exam  Constitutional: He is oriented to person, place, and time. He appears well-developed and well-nourished. No distress.  HENT:  Head: Normocephalic and atraumatic.  Eyes: Conjunctivae and EOM are  normal.  Neck: Neck supple. No tracheal deviation present.  Cardiovascular: Normal rate, regular rhythm and normal heart sounds.  Pulmonary/Chest: Effort normal and breath sounds normal. No respiratory distress.  Abdominal: Soft. Bowel sounds are normal. There is no hepatosplenomegaly. There is no tenderness. There is no CVA tenderness and negative Murphy's sign.  Musculoskeletal: Normal range of motion.  Neurological: He is alert and oriented to person, place, and time.  Skin: Skin is warm and dry.  Psychiatric: He has a normal mood and affect. His behavior is normal.  Nursing note and vitals  reviewed.  BP (!) 148/88 (BP Location: Left Arm, Patient Position: Sitting, Cuff Size: Large)   Pulse 66   Temp 98.4 F (36.9 C) (Oral)   Resp 16   Ht 5\' 9"  (1.753 m)   Wt 212 lb 9.6 oz (96.4 kg)   SpO2 100%   BMI 31.40 kg/m     Assessment & Plan:     1. Epigastric pain - start ppi  2. Medication monitoring encounter   3. Anemia, unspecified type - mild, stable from 6 mos prior and within range over prior yrs. Consider ferritin with next labs if persist. Will hold off on checking ferritin to work-up his anemia at this time since might be elevated in the setting of an acute infection.  4. Diarrhea, unspecified type - if sxs persist after starting ppi, check stool studies  5. Gastroesophageal reflux disease, esophagitis presence not specified   6. Essential hypertension - Continue current BP medication. Okay to refill any current meds x 6 months when requested even if not previously prescribed here.    Orders Placed This Encounter  Procedures  . Comprehensive metabolic panel  . H. pylori breath test  . Fecal lactoferrin, quant  . GI Profile, Stool, PCR  . POCT CBC  . POC Hemoccult Bld/Stl (3-Cd Home Screen)    Standing Status:   Future    Standing Expiration Date:   04/05/2018    Meds ordered this encounter  Medications  . irbesartan-hydrochlorothiazide (AVALIDE) 150-12.5 MG tablet    Sig: Take 2 tablets by mouth daily.    Dispense:  180 tablet    Refill:  1  . omeprazole (PRILOSEC) 40 MG capsule    Sig: Take 1 capsule (40 mg total) by mouth daily. 30 minutes before a meal or before bed    Dispense:  30 capsule    Refill:  1  . dicyclomine (BENTYL) 20 MG tablet    Sig: Take 1 tablet (20 mg total) by mouth every 6 (six) hours as needed for spasms (and abdominal cramping).    Dispense:  40 tablet    Refill:  0    I personally performed the services described in this documentation, which was scribed in my presence. The recorded information has been reviewed and  considered, and addended by me as needed.   [name], M.D.  Primary Care at [facility] [address] [place], [place] [address] [phone] phone [phone] fax  04/07/17 7:01 PM

## 2017-04-05 NOTE — Patient Instructions (Signed)
Consider starting a daily probiotic or eating yogurt daily - choose a probioitc that has a lot of different types of bacterial strains in it (has a lot of different latin names under the active ingredient list.  Stay on the omeprazole 30 minutes before a meal or bed once daily for at least 4-6 weeks, then come off. You can use the dicyclomine whenever needed - take it before a meal - to decrease the bowel cramping after. If your symptoms are starting to significantly improve within a 4-7 days, then collect the stool sample and return to our office so we can ensure you do not have any gut infection. If not getting improvement within 2 weeks (1 week after you did the stool samples) please call or come back in - we will likely want to try you on a course of antibiotics to treat a potential undetectable infection.    Food Choices to Help Relieve Diarrhea, Adult When you have diarrhea, the foods you eat and your eating habits are very important. Choosing the right foods and drinks can help:  Relieve diarrhea.  Replace lost fluids and nutrients.  Prevent dehydration.  What general guidelines should I follow? Relieving diarrhea  Choose foods with less than 2 g or .07 oz. of fiber per serving.  Limit fats to less than 8 tsp (38 g or 1.34 oz.) a day.  Avoid the following: ? Foods and beverages sweetened with high-fructose corn syrup, honey, or sugar alcohols such as xylitol, sorbitol, and mannitol. ? Foods that contain a lot of fat or sugar. ? Fried, greasy, or spicy foods. ? High-fiber grains, breads, and cereals. ? Raw fruits and vegetables.  Eat foods that are rich in probiotics. These foods include dairy products such as yogurt and fermented milk products. They help increase healthy bacteria in the stomach and intestines (gastrointestinal tract, or GI tract).  If you have lactose intolerance, avoid dairy products. These may make your diarrhea worse.  Take medicine to help stop diarrhea  (antidiarrheal medicine) only as told by your health care provider. Replacing nutrients  Eat small meals or snacks every 3-4 hours.  Eat bland foods, such as white rice, toast, or baked potato, until your diarrhea starts to get better. Gradually reintroduce nutrient-rich foods as tolerated or as told by your health care provider. This includes: ? Well-cooked protein foods. ? Peeled, seeded, and soft-cooked fruits and vegetables. ? Low-fat dairy products.  Take vitamin and mineral supplements as told by your health care provider. Preventing dehydration   Start by sipping water or a special solution to prevent dehydration (oral rehydration solution, ORS). Urine that is clear or pale yellow means that you are getting enough fluid.  Try to drink at least 8-10 cups of fluid each day to help replace lost fluids.  You may add other liquids in addition to water, such as clear juice or decaffeinated sports drinks, as tolerated or as told by your health care provider.  Avoid drinks with caffeine, such as coffee, tea, or soft drinks.  Avoid alcohol. What foods are recommended? The items listed may not be a complete list. Talk with your health care provider about what dietary choices are best for you. Grains White rice. White, [place], or pita breads (fresh or toasted), including plain rolls, buns, or bagels. White pasta. Saltine, soda, or graham crackers. Pretzels. Low-fiber cereal. Cooked cereals made with water (such as cornmeal, farina, or cream cereals). Plain muffins. Matzo. Melba toast. Zwieback. Vegetables Potatoes (without the skin). Most  well-cooked and canned vegetables without skins or seeds. Tender lettuce. Fruits Apple sauce. Fruits canned in juice. Cooked apricots, cherries, grapefruit, peaches, pears, or plums. Fresh bananas and cantaloupe. Meats and other protein foods Baked or boiled chicken. Eggs. Tofu. Fish. Seafood. Smooth nut butters. Ground or well-cooked tender beef, ham,  veal, lamb, pork, or poultry. Dairy Plain yogurt, kefir, and unsweetened liquid yogurt. Lactose-free milk, buttermilk, skim milk, or soy milk. Low-fat or nonfat hard cheese. Beverages Water. Low-calorie sports drinks. Fruit juices without pulp. Strained tomato and vegetable juices. Decaffeinated teas. Sugar-free beverages not sweetened with sugar alcohols. Oral rehydration solutions, if approved by your health care provider. Seasoning and other foods Bouillon, broth, or soups made from recommended foods. What foods are not recommended? The items listed may not be a complete list. Talk with your health care provider about what dietary choices are best for you. Grains Whole grain, whole wheat, bran, or rye breads, rolls, pastas, and crackers. Wild or brown rice. Whole grain or bran cereals. Barley. Oats and oatmeal. Corn tortillas or taco shells. Granola. Popcorn. Vegetables Raw vegetables. Fried vegetables. Cabbage, broccoli, Brussels sprouts, artichokes, baked beans, beet greens, corn, kale, legumes, peas, sweet potatoes, and yams. Potato skins. Cooked spinach and cabbage. Fruits Dried fruit, including raisins and dates. Raw fruits. Stewed or dried prunes. Canned fruits with syrup. Meat and other protein foods Fried or fatty meats. Deli meats. Chunky nut butters. Nuts and seeds. Beans and lentils. Berniece Salines. Hot dogs. Sausage. Dairy High-fat cheeses. Whole milk, chocolate milk, and beverages made with milk, such as milk shakes. Half-and-half. Cream. sour cream. Ice cream. Beverages Caffeinated beverages (such as coffee, tea, soda, or energy drinks). Alcoholic beverages. Fruit juices with pulp. Prune juice. Soft drinks sweetened with high-fructose corn syrup or sugar alcohols. High-calorie sports drinks. Fats and oils Butter. Cream sauces. Margarine. Salad oils. Plain salad dressings. Olives. Avocados. Mayonnaise. Sweets and desserts Sweet rolls, doughnuts, and sweet breads. Sugar-free desserts  sweetened with sugar alcohols such as xylitol and sorbitol. Seasoning and other foods Honey. Hot sauce. Chili powder. Gravy. Cream-based or milk-based soups. Pancakes and waffles. Summary  When you have diarrhea, the foods you eat and your eating habits are very important.  Make sure you get at least 8-10 cups of fluid each day, or enough to keep your urine clear or pale yellow.  Eat bland foods and gradually reintroduce healthy, nutrient-rich foods as tolerated, or as told by your health care provider.  Avoid high-fiber, fried, greasy, or spicy foods. This information is not intended to replace advice given to you by your health care provider. Make sure you discuss any questions you have with your health care provider. Document Released: 06/08/2003 Document Revised: 03/15/2016 Document Reviewed: 03/15/2016 Elsevier Interactive Patient Education  2018 [place] for Gastroesophageal Reflux Disease, Adult When you have gastroesophageal reflux disease (GERD), the foods you eat and your eating habits are very important. Choosing the right foods can help ease the discomfort of GERD. Consider working with a diet and nutrition specialist (dietitian) to help you make healthy food choices. What general guidelines should I follow? Eating plan  Choose healthy foods low in fat, such as fruits, vegetables, whole grains, low-fat dairy products, and lean meat, fish, and poultry.  Eat frequent, small meals instead of three large meals each day. Eat your meals slowly, in a relaxed setting. Avoid bending over or lying down until 2-3 hours after eating.  Limit high-fat foods such as fatty meats or fried foods.  Limit your intake of oils, butter, and shortening to less than 8 teaspoons each day.  Avoid the following: ? Foods that cause symptoms. These may be different for different people. Keep a food diary to keep track of foods that cause symptoms. ? Alcohol. ? Drinking large  amounts of liquid with meals. ? Eating meals during the 2-3 hours before bed.  Cook foods using methods other than frying. This may include baking, grilling, or broiling. Lifestyle   Maintain a healthy weight. Ask your health care provider what weight is healthy for you. If you need to lose weight, work with your health care provider to do so safely.  Exercise for at least 30 minutes on 5 or more days each week, or as told by your health care provider.  Avoid wearing clothes that fit tightly around your waist and chest.  Do not use any products that contain nicotine or tobacco, such as cigarettes and e-cigarettes. If you need help quitting, ask your health care provider.  Sleep with the head of your bed raised. Use a wedge under the mattress or blocks under the bed frame to raise the head of the bed. What foods are not recommended? The items listed may not be a complete list. Talk with your dietitian about what dietary choices are best for you. Grains Pastries or quick breads with added fat. [place] toast. Vegetables Deep fried vegetables. [place] fries. Any vegetables prepared with added fat. Any vegetables that cause symptoms. For some people this may include tomatoes and tomato products, chili peppers, onions and garlic, and horseradish. Fruits Any fruits prepared with added fat. Any fruits that cause symptoms. For some people this may include citrus fruits, such as oranges, grapefruit, pineapple, and lemons. Meats and other protein foods High-fat meats, such as fatty beef or pork, hot dogs, ribs, ham, sausage, salami and bacon. Fried meat or protein, including fried fish and fried chicken. Nuts and nut butters. Dairy Whole milk and chocolate milk. Sour cream. Cream. Ice cream. Cream cheese. Milk shakes. Beverages Coffee and tea, with or without caffeine. Carbonated beverages. Sodas. Energy drinks. Fruit juice made with acidic fruits (such as orange or grapefruit). Tomato juice.  Alcoholic drinks. Fats and oils Butter. Margarine. Shortening. Ghee. Sweets and desserts Chocolate and cocoa. Donuts. Seasoning and other foods Pepper. Peppermint and spearmint. Any condiments, herbs, or seasonings that cause symptoms. For some people, this may include curry, hot sauce, or vinegar-based salad dressings. Summary  When you have gastroesophageal reflux disease (GERD), food and lifestyle choices are very important to help ease the discomfort of GERD.  Eat frequent, small meals instead of three large meals each day. Eat your meals slowly, in a relaxed setting. Avoid bending over or lying down until 2-3 hours after eating.  Limit high-fat foods such as fatty meat or fried foods. This information is not intended to replace advice given to you by your health care provider. Make sure you discuss any questions you have with your health care provider. Document Released: 03/18/2005 Document Revised: 03/19/2016 Document Reviewed: 03/19/2016 Elsevier Interactive Patient Education  [employer].

## 2017-04-06 LAB — COMPREHENSIVE METABOLIC PANEL
ALT: 28 IU/L (ref 0–44)
AST: 24 IU/L (ref 0–40)
Albumin/Globulin Ratio: 1.8 (ref 1.2–2.2)
Albumin: 4.9 g/dL (ref 3.5–5.5)
Alkaline Phosphatase: 63 IU/L (ref 39–117)
BUN/Creatinine Ratio: 12 (ref 9–20)
BUN: 14 mg/dL (ref 6–24)
Bilirubin Total: 0.4 mg/dL (ref 0.0–1.2)
CALCIUM: 9.8 mg/dL (ref 8.7–10.2)
CO2: 25 mmol/L (ref 20–29)
CREATININE: 1.16 mg/dL (ref 0.76–1.27)
Chloride: 99 mmol/L (ref 96–106)
GFR calc Af Amer: 83 mL/min/{1.73_m2} (ref >59)
GFR, EST NON AFRICAN AMERICAN: 72 mL/min/{1.73_m2} (ref >59)
Globulin, Total: 2.8 g/dL (ref 1.5–4.5)
Glucose: 102 mg/dL — ABNORMAL HIGH (ref 65–99)
Potassium: 4.2 mmol/L (ref 3.5–5.2)
Sodium: 139 mmol/L (ref 134–144)
Total Protein: 7.7 g/dL (ref 6.0–8.5)

## 2017-04-08 LAB — H. PYLORI BREATH TEST: H pylori Breath Test: NEGATIVE

## 2017-05-28 ENCOUNTER — Encounter: Payer: Self-pay | Admitting: Radiology

## 2017-05-28 ENCOUNTER — Other Ambulatory Visit: Payer: Self-pay | Admitting: Family Medicine

## 2017-05-28 NOTE — Addendum Note (Signed)
Addended by: Shawnee Knapp on: 05/28/2017 12:36 PM   Modules accepted: Orders

## 2017-06-07 ENCOUNTER — Other Ambulatory Visit: Payer: Self-pay

## 2017-06-07 ENCOUNTER — Ambulatory Visit: Payer: Managed Care, Other (non HMO) | Admitting: Family Medicine

## 2017-06-07 ENCOUNTER — Encounter: Payer: Self-pay | Admitting: Family Medicine

## 2017-06-07 VITALS — BP 170/88 | HR 69 | Temp 98.0°F | Resp 16 | Ht 70.28 in | Wt 217.0 lb

## 2017-06-07 DIAGNOSIS — N522 Drug-induced erectile dysfunction: Secondary | ICD-10-CM | POA: Diagnosis not present

## 2017-06-07 DIAGNOSIS — D649 Anemia, unspecified: Secondary | ICD-10-CM | POA: Diagnosis not present

## 2017-06-07 DIAGNOSIS — I10 Essential (primary) hypertension: Secondary | ICD-10-CM | POA: Diagnosis not present

## 2017-06-07 DIAGNOSIS — Z5181 Encounter for therapeutic drug level monitoring: Secondary | ICD-10-CM | POA: Diagnosis not present

## 2017-06-07 DIAGNOSIS — D509 Iron deficiency anemia, unspecified: Secondary | ICD-10-CM | POA: Insufficient documentation

## 2017-06-07 MED ORDER — AMLODIPINE-OLMESARTAN 10-40 MG PO TABS: 1.0000 | ORAL_TABLET | Freq: Every day | ORAL | 1 refills | Status: DC

## 2017-06-07 NOTE — Progress Notes (Signed)
Subjective:  By signing my name below, I, Kevin Jarvis, attest that this documentation has been prepared under the direction and in the presence of Kevin Cheadle, MD. Electronically Signed: Moises Jarvis, [place]. 06/07/2017 , 8:22 AM .  Patient was seen in Room 1 .   Patient ID: Kevin Jarvis, male    DOB: [birth year], 52 y.o.   MRN: [id number] Chief Complaint  Patient presents with  . Hypertension    pt states he would like to follow-up with his HTN, pt states his abdominal pain and heart burn has been better since last OV on 1/5. Pt states he has not been taking any BP medication x 1 month.    HPI Kevin Jarvis is a 52 y.o. male who presents to Primary Care at [facility] for follow up on HTN. Patient states he stopped taking his BP medications for a month now. He informs feeling fatigue while on his medications, as well as side effect of ED. He reports stopping all of his medications cold [place]. He's been going to the gym every day. He checks his BP every now and then at home and at [facility], running in the range of [id number] systolic. He had lab work done at Kevin [facility]vis office, and patient states everything was normal. He didn't send in the [employer] because he was concerned with more bills and payments; however, he agrees to [employer] today and will send it in.   Past Medical History:  Diagnosis Date  . Anxiety   . Arthritis   . Hypertension    History reviewed. No pertinent surgical history. Prior to Admission medications   Medication Sig Start Date End Date Taking? Authorizing Provider  amLODipine (NORVASC) 10 MG tablet Take 1 tablet (10 mg total) by mouth daily. Patient not taking: Reported on 06/07/2017 10/24/16   Kevin Knapp, MD  cloNIDine (CATAPRES - DOSED IN MG/24 HR) 0.1 mg/24hr patch Place 0.1 mg onto the skin once a week.  12/30/16   Kevin Prows, MD  dicyclomine (BENTYL) 20 MG tablet Take 1 tablet (20 mg total) by mouth every 6 (six) hours as needed for spasms (and abdominal  cramping). Patient not taking: Reported on 06/07/2017 04/05/17   Kevin Knapp, MD  eplerenone (INSPRA) 50 MG tablet Take 50 mg by mouth daily. 12/30/16   Kevin Prows, MD  irbesartan-hydrochlorothiazide (AVALIDE) 150-12.5 MG tablet Take 2 tablets by mouth daily. Patient not taking: Reported on 06/07/2017 04/05/17   Kevin Knapp, MD  Nebivolol HCl (BYSTOLIC) 20 MG TABS Take 20 mg by mouth. 12/30/16   Kevin Prows, MD  omeprazole (PRILOSEC) 40 MG capsule Take 1 capsule (40 mg total) by mouth daily. 30 minutes before a meal or before bed Patient not taking: Reported on 06/07/2017 04/05/17   Kevin Knapp, MD  traZODone (DESYREL) 50 MG tablet Take 0.5-1 tablets (25-50 mg total) by mouth at bedtime as needed for sleep. Patient not taking: Reported on 06/07/2017 09/28/16   Kevin Knapp, MD   No Known Allergies History reviewed. No pertinent family history. Social History   Socioeconomic History  . Marital status: Single    Spouse name: None  . Number of children: None  . Years of education: None  . Highest education level: None  Social Needs  . Financial resource strain: None  . Food insecurity - worry: None  . Food insecurity - inability: None  . Transportation needs - medical: None  . Transportation needs - non-medical: None  Occupational History  . None  Tobacco Use  . Smoking status: Never Smoker  . Smokeless tobacco: Never Used  Substance and Sexual Activity  . Alcohol use: No  . Drug use: No  . Sexual activity: None  Other Topics Concern  . None  Social History Narrative   From [place] - Moved to [place] 2000   Lives with wife and son   Depression screen [facility] [address] 2/9 06/07/2017 04/05/2017 01/27/2017 10/24/2016 09/28/2016  Decreased Interest 0 0 0 0 0  Down, Depressed, Hopeless 0 0 0 0 0  PHQ - 2 Score 0 0 0 0 0    Review of Systems  Constitutional: Negative for fatigue and unexpected weight change.  Eyes: Negative for visual disturbance.  Respiratory: Negative for cough, chest tightness and shortness of  breath.   Cardiovascular: Negative for chest pain, palpitations and leg swelling.  Gastrointestinal: Negative for abdominal pain and Jar[facility] in stool.  Neurological: Negative for dizziness, light-headedness and headaches.       Objective:   Physical Exam  Constitutional: He is oriented to person, place, and time. He appears well-developed and well-nourished. No distress.  HENT:  Head: Normocephalic and atraumatic.  Eyes: EOM are normal. Pupils are equal, round, and reactive to light.  Neck: Neck supple.  Cardiovascular: Normal rate.  Pulmonary/Chest: Effort normal. No respiratory distress.  Musculoskeletal: Normal range of motion.  Neurological: He is alert and oriented to person, place, and time.  Skin: Skin is warm and dry.  Psychiatric: He has a normal mood and affect. His behavior is normal.  Nursing note and vitals reviewed.   BP (!) 160/80   Pulse 69   Temp 98 F (36.7 C) (Oral)   Resp 16   Ht 5' 10.28" (1.785 m)   Wt 217 lb (98.4 kg)   SpO2 98%   BMI 30.89 kg/m   [8:24 AM] BP recheck in room, left arm, 170/88  Echocardiogram 01/02/2017: Mild to moderate MR, grade 2 diastolic dysfunction with preserved LVEF 66%    Assessment & Plan:   Distressed about cost of medical care and surprise bills. Agrees to complete the cologuard he has at home.   1. Essential hypertension - BP was well-controlled on amlodipine 10, clonidine patch 0.1mg  weekly, inspra 50 qd, irbesartan-hctz 150-12.5 2 tabs qd, and nebivolol 20 qd but stopped EVERYTHING for the past month due to fatigue and ED.  Going to the gym very freq w/o any cardiac sxs. Last saw Kevin Jarvis 02/17/2017 4 mos prior w/ notes in system - no stress test done since he was asymptomatic. Maybe pt will comply with less tight control of his BP which while not ideal is better than nothing since he has decided not to comply with the "all" treatment - start amlodipine-olmesartan 10-40. could restart inspra or bystolic next. avoid  clonidine and diuretics as  those are the most likely to cause the ED that will make him stop his meds again.  2. Medication monitoring encounter   3. Anemia, unspecified type   4. Drug-induced erectile dysfunction - ED that was unreponsive to meds given to him by his friends that were successful to him. Declines trial of rx PDE-I from me due to cost despite informing that now is reasonable as generic sildenafil is avail.  Free and total testosterone were checked in a.m. by Kevin Jarvis ~63months prior and patient reports he thinks they were normal.    Orders Placed This Encounter  Procedures  . CBC with Differential/Platelet  . Ferritin    Meds ordered  this encounter  Medications  . amLODipine-olmesartan (AZOR) 10-40 MG tablet    Sig: Take 1 tablet by mouth daily.    Dispense:  90 tablet    Refill:  1    I personally performed the services described in this documentation, which was scribed in my presence. The recorded information has been reviewed and considered, and addended by me as needed.   Kevin Jarvis, M.D.  Primary Care at [facility] [address] [place], [place] [address] [phone] phone [phone] fax  06/07/17 11:38 AM

## 2017-06-07 NOTE — Patient Instructions (Addendum)
Start just this new BP med - it is one of the least that is likely to cause the problem but will hopefully be effective in bring down your BP.  DO THE COLOGUARD TEST.    IF you received an x-ray today, you will receive an invoice from [facility]. Please contact [facility] at [phone] with questions or concerns regarding your invoice.   IF you received labwork today, you will receive an invoice from [place]. Please contact [facility] at [phone] with questions or concerns regarding your invoice.   Our billing staff will not be able to assist you with questions regarding bills from these companies.  You will be contacted with the lab results as soon as they are available. The fastest way to get your results is to activate your My Chart account. Instructions are located on the last page of this paperwork. If you have not heard from [place] regarding the results in 2 weeks, please contact this office.      Hypertension Hypertension, commonly called high blood pressure, is when the force of blood pumping through the arteries is too strong. The arteries are the blood vessels that carry blood from the heart throughout the body. Hypertension forces the heart to work harder to pump blood and may cause arteries to become narrow or stiff. Having untreated or uncontrolled hypertension can cause heart attacks, strokes, kidney disease, and other problems. A blood pressure reading consists of a higher number over a lower number. Ideally, your blood pressure should be below 120/80. The first ("top") number is called the systolic pressure. It is a measure of the pressure in your arteries as your heart beats. The second ("bottom") number is called the diastolic pressure. It is a measure of the pressure in your arteries as the heart relaxes. What are the causes? The cause of this condition is not known. What increases the risk? Some risk factors for high blood pressure are under your  control. Others are not. Factors you can change  Smoking.  Having type 2 diabetes mellitus, high cholesterol, or both.  Not getting enough exercise or physical activity.  Being overweight.  Having too much fat, sugar, calories, or salt (sodium) in your diet.  Drinking too much alcohol. Factors that are difficult or impossible to change  Having chronic kidney disease.  Having a family history of high blood pressure.  Age. Risk increases with age.  Race. You may be at higher risk if you are African-American.  Gender. Men are at higher risk than women before age 8. After age 19, women are at higher risk than men.  Having obstructive sleep apnea.  Stress. What are the signs or symptoms? Extremely high blood pressure (hypertensive crisis) may cause:  Headache.  Anxiety.  Shortness of breath.  Nosebleed.  Nausea and vomiting.  Severe chest pain.  Jerky movements you cannot control (seizures).  How is this diagnosed? This condition is diagnosed by measuring your blood pressure while you are seated, with your arm resting on a surface. The cuff of the blood pressure monitor will be placed directly against the skin of your upper arm at the level of your heart. It should be measured at least twice using the same arm. Certain conditions can cause a difference in blood pressure between your right and left arms. Certain factors can cause blood pressure readings to be lower or higher than normal (elevated) for a short period of time:  When your blood pressure is higher when you are in a health  care provider's office than when you are at home, this is called white coat hypertension. Most people with this condition do not need medicines.  When your blood pressure is higher at home than when you are in a health care provider's office, this is called masked hypertension. Most people with this condition may need medicines to control blood pressure.  If you have a high blood  pressure reading during one visit or you have normal blood pressure with other risk factors:  You may be asked to return on a different day to have your blood pressure checked again.  You may be asked to monitor your blood pressure at home for 1 week or longer.  If you are diagnosed with hypertension, you may have other blood or imaging tests to help your health care provider understand your overall risk for other conditions. How is this treated? This condition is treated by making healthy lifestyle changes, such as eating healthy foods, exercising more, and reducing your alcohol intake. Your health care provider may prescribe medicine if lifestyle changes are not enough to get your blood pressure under control, and if:  Your systolic blood pressure is above 130.  Your diastolic blood pressure is above 80.  Your personal target blood pressure may vary depending on your medical conditions, your age, and other factors. Follow these instructions at home: Eating and drinking  Eat a diet that is high in fiber and potassium, and low in sodium, added sugar, and fat. An example eating plan is called the DASH (Dietary Approaches to Stop Hypertension) diet. To eat this way: ? Eat plenty of fresh fruits and vegetables. Try to fill half of your plate at each meal with fruits and vegetables. ? Eat whole grains, such as whole wheat pasta, brown rice, or whole grain bread. Fill about one quarter of your plate with whole grains. ? Eat or drink low-fat dairy products, such as skim milk or low-fat yogurt. ? Avoid fatty cuts of meat, processed or cured meats, and poultry with skin. Fill about one quarter of your plate with lean proteins, such as fish, chicken without skin, beans, eggs, and tofu. ? Avoid premade and processed foods. These tend to be higher in sodium, added sugar, and fat.  Reduce your daily sodium intake. Most people with hypertension should eat less than 1,500 mg of sodium a day.  Limit  alcohol intake to no more than 1 drink a day for nonpregnant women and 2 drinks a day for men. One drink equals 12 oz of beer, 5 oz of wine, or 1 oz of hard liquor. Lifestyle  Work with your health care provider to maintain a healthy body weight or to lose weight. Ask what an ideal weight is for you.  Get at least 30 minutes of exercise that causes your heart to beat faster (aerobic exercise) most days of the week. Activities may include walking, swimming, or biking.  Include exercise to strengthen your muscles (resistance exercise), such as pilates or lifting weights, as part of your weekly exercise routine. Try to do these types of exercises for 30 minutes at least 3 days a week.  Do not use any products that contain nicotine or tobacco, such as cigarettes and e-cigarettes. If you need help quitting, ask your health care provider.  Monitor your blood pressure at home as told by your health care provider.  Keep all follow-up visits as told by your health care provider. This is important. Medicines  Take over-the-counter and prescription medicines only  as told by your health care provider. Follow directions carefully. Blood pressure medicines must be taken as prescribed.  Do not skip doses of blood pressure medicine. Doing this puts you at risk for problems and can make the medicine less effective.  Ask your health care provider about side effects or reactions to medicines that you should watch for. Contact a health care provider if:  You think you are having a reaction to a medicine you are taking.  You have headaches that keep coming back (recurring).  You feel dizzy.  You have swelling in your ankles.  You have trouble with your vision. Get help right away if:  You develop a severe headache or confusion.  You have unusual weakness or numbness.  You feel faint.  You have severe pain in your chest or abdomen.  You vomit repeatedly.  You have trouble  breathing. Summary  Hypertension is when the force of blood pumping through your arteries is too strong. If this condition is not controlled, it may put you at risk for serious complications.  Your personal target blood pressure may vary depending on your medical conditions, your age, and other factors. For most people, a normal blood pressure is less than 120/80.  Hypertension is treated with lifestyle changes, medicines, or a combination of both. Lifestyle changes include weight loss, eating a healthy, low-sodium diet, exercising more, and limiting alcohol. This information is not intended to replace advice given to you by your health care provider. Make sure you discuss any questions you have with your health care provider. Document Released: 03/18/2005 Document Revised: 02/14/2016 Document Reviewed: 02/14/2016 Elsevier Interactive Patient Education  2018 [place] Eating Plan DASH stands for "Dietary Approaches to Stop Hypertension." The DASH eating plan is a healthy eating plan that has been shown to reduce high blood pressure (hypertension). It may also reduce your risk for type 2 diabetes, heart disease, and stroke. The DASH eating plan may also help with weight loss. What are tips for following this plan? General guidelines  Avoid eating more than 2,300 mg (milligrams) of salt (sodium) a day. If you have hypertension, you may need to reduce your sodium intake to 1,500 mg a day.  Limit alcohol intake to no more than 1 drink a day for nonpregnant women and 2 drinks a day for men. One drink equals 12 oz of beer, 5 oz of wine, or 1 oz of hard liquor.  Work with your health care provider to maintain a healthy body weight or to lose weight. Ask what an ideal weight is for you.  Get at least 30 minutes of exercise that causes your heart to beat faster (aerobic exercise) most days of the week. Activities may include walking, swimming, or biking.  Work with your health care  provider or diet and nutrition specialist (dietitian) to adjust your eating plan to your individual calorie needs. Reading food labels  Check food labels for the amount of sodium per serving. Choose foods with less than 5 percent of the Daily Value of sodium. Generally, foods with less than 300 mg of sodium per serving fit into this eating plan.  To find whole grains, look for the word "whole" as the first word in the ingredient list. Shopping  Buy products labeled as "low-sodium" or "no salt added."  Buy fresh foods. Avoid canned foods and premade or frozen meals. Cooking  Avoid adding salt when cooking. Use salt-free seasonings or herbs instead of table salt or sea salt. Check  with your health care provider or pharmacist before using salt substitutes.  Do not fry foods. Cook foods using healthy methods such as baking, boiling, grilling, and broiling instead.  Cook with heart-healthy oils, such as olive, canola, soybean, or sunflower oil. Meal planning   Eat a balanced diet that includes: ? 5 or more servings of fruits and vegetables each day. At each meal, try to fill half of your plate with fruits and vegetables. ? Up to 6-8 servings of whole grains each day. ? Less than 6 oz of lean meat, poultry, or fish each day. A 3-oz serving of meat is about the same size as a deck of cards. One egg equals 1 oz. ? 2 servings of low-fat dairy each day. ? A serving of nuts, seeds, or beans 5 times each week. ? Heart-healthy fats. Healthy fats called Omega-3 fatty acids are found in foods such as flaxseeds and coldwater fish, like sardines, salmon, and mackerel.  Limit how much you eat of the following: ? Canned or prepackaged foods. ? Food that is high in trans fat, such as fried foods. ? Food that is high in saturated fat, such as fatty meat. ? Sweets, desserts, sugary drinks, and other foods with added sugar. ? Full-fat dairy products.  Do not salt foods before eating.  Try to eat at  least 2 vegetarian meals each week.  Eat more home-cooked food and less restaurant, buffet, and fast food.  When eating at a restaurant, ask that your food be prepared with less salt or no salt, if possible. What foods are recommended? The items listed may not be a complete list. Talk with your dietitian about what dietary choices are best for you. Grains Whole-grain or whole-wheat bread. Whole-grain or whole-wheat pasta. Brown rice. [place]. Bulgur. Whole-grain and low-sodium cereals. Pita bread. Low-fat, low-sodium crackers. Whole-wheat flour tortillas. Vegetables Fresh or frozen vegetables (raw, steamed, roasted, or grilled). Low-sodium or reduced-sodium tomato and vegetable juice. Low-sodium or reduced-sodium tomato sauce and tomato paste. Low-sodium or reduced-sodium canned vegetables. Fruits All fresh, dried, or frozen fruit. Canned fruit in natural juice (without added sugar). Meat and other protein foods Skinless chicken or [place]. Ground chicken or [place]. Pork with fat trimmed off. Fish and seafood. Egg whites. Dried beans, peas, or lentils. Unsalted nuts, nut butters, and seeds. Unsalted canned beans. Lean cuts of beef with fat trimmed off. Low-sodium, lean deli meat. Dairy Low-fat (1%) or fat-free (skim) milk. Fat-free, low-fat, or reduced-fat cheeses. Nonfat, low-sodium ricotta or cottage cheese. Low-fat or nonfat yogurt. Low-fat, low-sodium cheese. Fats and oils Soft margarine without trans fats. Vegetable oil. Low-fat, reduced-fat, or light mayonnaise and salad dressings (reduced-sodium). Canola, safflower, olive, soybean, and sunflower oils. Avocado. Seasoning and other foods Herbs. Spices. Seasoning mixes without salt. Unsalted popcorn and pretzels. Fat-free sweets. What foods are not recommended? The items listed may not be a complete list. Talk with your dietitian about what dietary choices are best for you. Grains Baked goods made with fat, such as croissants,  muffins, or some breads. Dry pasta or rice meal packs. Vegetables Creamed or fried vegetables. Vegetables in a cheese sauce. Regular canned vegetables (not low-sodium or reduced-sodium). Regular canned tomato sauce and paste (not low-sodium or reduced-sodium). Regular tomato and vegetable juice (not low-sodium or reduced-sodium). Angie Fava. Olives. Fruits Canned fruit in a light or heavy syrup. Fried fruit. Fruit in cream or butter sauce. Meat and other protein foods Fatty cuts of meat. Ribs. Fried meat. Berniece Salines. Sausage. Bologna and other processed  lunch meats. Salami. Fatback. Hotdogs. Bratwurst. Salted nuts and seeds. Canned beans with added salt. Canned or smoked fish. Whole eggs or egg yolks. Chicken or [place] with skin. Dairy Whole or 2% milk, cream, and half-and-half. Whole or full-fat cream cheese. Whole-fat or sweetened yogurt. Full-fat cheese. Nondairy creamers. Whipped toppings. Processed cheese and cheese spreads. Fats and oils Butter. Stick margarine. Lard. Shortening. Ghee. Bacon fat. Tropical oils, such as coconut, palm kernel, or palm oil. Seasoning and other foods Salted popcorn and pretzels. Onion salt, garlic salt, seasoned salt, table salt, and sea salt. Worcestershire sauce. Tartar sauce. Barbecue sauce. Teriyaki sauce. Soy sauce, including reduced-sodium. Steak sauce. Canned and packaged gravies. Fish sauce. Oyster sauce. Cocktail sauce. Horseradish that you find on the shelf. Ketchup. Mustard. Meat flavorings and tenderizers. Bouillon cubes. Hot sauce and Tabasco sauce. Premade or packaged marinades. Premade or packaged taco seasonings. Relishes. Regular salad dressings. Where to find more information:  National Heart, Lung, and [place]: https://wilson-eaton.com/  American Heart Association: www.heart.org Summary  The DASH eating plan is a healthy eating plan that has been shown to reduce high blood pressure (hypertension). It may also reduce your risk for type 2 diabetes,  heart disease, and stroke.  With the DASH eating plan, you should limit salt (sodium) intake to 2,300 mg a day. If you have hypertension, you may need to reduce your sodium intake to 1,500 mg a day.  When on the DASH eating plan, aim to eat more fresh fruits and vegetables, whole grains, lean proteins, low-fat dairy, and heart-healthy fats.  Work with your health care provider or diet and nutrition specialist (dietitian) to adjust your eating plan to your individual calorie needs. This information is not intended to replace advice given to you by your health care provider. Make sure you discuss any questions you have with your health care provider. Document Released: 03/07/2011 Document Revised: 03/11/2016 Document Reviewed: 03/11/2016 Elsevier Interactive Patient Education  [employer].

## 2017-06-08 LAB — CBC WITH DIFFERENTIAL/PLATELET
BASOS: 0 %
Basophils Absolute: 0 10*3/uL (ref 0.0–0.2)
EOS (ABSOLUTE): 0 10*3/uL (ref 0.0–0.4)
Eos: 1 %
HEMOGLOBIN: 12.9 g/dL — AB (ref 13.0–17.7)
Hematocrit: 39.1 % (ref 37.5–51.0)
IMMATURE GRANS (ABS): 0 10*3/uL (ref 0.0–0.1)
Immature Granulocytes: 0 %
LYMPHS ABS: 2.2 10*3/uL (ref 0.7–3.1)
LYMPHS: 44 %
MCH: 29.6 pg (ref 26.6–33.0)
MCHC: 33 g/dL (ref 31.5–35.7)
MCV: 90 fL (ref 79–97)
Monocytes Absolute: 0.4 10*3/uL (ref 0.1–0.9)
Monocytes: 8 %
NEUTROS ABS: 2.4 10*3/uL (ref 1.4–7.0)
Neutrophils: 47 %
Platelets: 239 10*3/uL (ref 150–379)
RBC: 4.36 x10E6/uL (ref 4.14–5.80)
RDW: 13.4 % (ref 12.3–15.4)
WBC: 5 10*3/uL (ref 3.4–10.8)

## 2017-06-08 LAB — FERRITIN: Ferritin: 26 ng/mL — ABNORMAL LOW (ref 30–400)

## 2017-08-13 ENCOUNTER — Other Ambulatory Visit: Payer: Self-pay

## 2017-08-13 ENCOUNTER — Ambulatory Visit: Payer: Self-pay | Admitting: Physician Assistant

## 2017-08-13 ENCOUNTER — Encounter: Payer: Self-pay | Admitting: Physician Assistant

## 2017-08-13 VITALS — BP 162/80 | HR 80 | Temp 98.5°F | Resp 17 | Ht 69.41 in | Wt 206.6 lb

## 2017-08-13 DIAGNOSIS — I10 Essential (primary) hypertension: Secondary | ICD-10-CM

## 2017-08-13 MED ORDER — NEBIVOLOL HCL 5 MG PO TABS: 5.0000 mg | ORAL_TABLET | Freq: Every day | ORAL | 0 refills | Status: DC

## 2017-08-13 NOTE — Progress Notes (Signed)
08/15/2017 1:40 PM   DOB: [birth year] / MRN: [id number]  SUBJECTIVE:  Kevin Jarvis is a 52 y.o. male presenting for recalcitrant HTN.  He has been difficult to control in the past due to medication non compliance 2/2 erectile dysfunction.  Tells me today that he has just recently started back on the medication last prescribed by Dr. Brigitte Pulse, who did feel he would likely need the addition no non selective beta blocker in addition.  He has also seen cards who did have his BP well controlled, however he stopped the medication due to aforementioned.  Today he needs a form signed saying he is safe to participate in a work physical.  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  He was sent here by his company because his blood pressure was too high to proceed without a signature from his primary care provider.  He tells me that he can walk on the treadmill for up to an hour and this includes intermittent jogging he has no chest pain or disproportionate shortness of breath with this.  He denies orthopnea and leg swelling.  His last echo did show that he had a grade 2 diastolic defect however the remainder of his cardiology work-up is been largely unremarkable.  This work-up included a renal stenosis ultrasound which was also negative.  He is never been evaluated for sleep apnea.  He does snore.   He has No Known Allergies.   He  has a past medical history of Anxiety, Arthritis, and Hypertension.    He  reports that he has never smoked. He has never used smokeless tobacco. He reports that he does not drink alcohol or use drugs. He  has no sexual activity history on file. The patient  has no past surgical history on file.  His family history is not on file.  Review of Systems  Constitutional: Negative for chills, diaphoresis and fever.  Respiratory: Negative for cough, hemoptysis, sputum production, shortness of breath and wheezing.   Cardiovascular: Negative for chest pain, orthopnea and  leg swelling.  Gastrointestinal: Negative for nausea.  Skin: Negative for rash.  Neurological: Negative for dizziness.    The problem list and medications were reviewed and updated by myself where necessary and exist elsewhere in the encounter.   OBJECTIVE:  BP (!) 162/80 (BP Location: Left Arm, Patient Position: Sitting, Cuff Size: Large)   Pulse 80   Temp 98.5 F (36.9 C) (Oral)   Resp 17   Ht 5' 9.41" (1.763 m)   Wt 206 lb 9.6 oz (93.7 kg)   SpO2 99%   BMI 30.15 kg/m   BP Readings from Last 3 Encounters:  08/13/17 (!) 162/80  06/07/17 (!) 170/88  04/05/17 (!) 148/88   Wt Readings from Last 3 Encounters:  08/13/17 206 lb 9.6 oz (93.7 kg)  06/07/17 217 lb (98.4 kg)  04/05/17 212 lb 9.6 oz (96.4 kg)      Physical Exam  Constitutional: He is oriented to person, place, and time. He appears well-developed. He does not appear ill.  HENT:  Mouth/Throat:    Eyes: Pupils are equal, round, and reactive to light. Conjunctivae and EOM are normal.  Cardiovascular: Normal rate, regular rhythm, S1 normal, S2 normal, normal heart sounds, intact distal pulses and normal pulses. Exam reveals no gallop and no friction rub.  No murmur heard. Pulmonary/Chest: Effort normal. No stridor. No respiratory distress. He has no wheezes. He has no rales.  Abdominal: He  exhibits no distension.  Musculoskeletal: Normal range of motion. He exhibits no edema.  Neurological: He is alert and oriented to person, place, and time. No cranial nerve deficit. Coordination normal.  Skin: Skin is warm and dry. He is not diaphoretic.  Psychiatric: He has a normal mood and affect.  Nursing note and vitals reviewed.   CMP Latest Ref Rng & Units 04/05/2017 10/24/2016 09/28/2016  Glucose 65 - 99 mg/dL 102(H) 111(H) 102(H)  BUN 6 - 24 mg/dL 14 12 14   Creatinine 0.76 - 1.27 mg/dL 1.16 1.15 1.15  Sodium 134 - 144 mmol/L 139 143 138  Potassium 3.5 - 5.2 mmol/L 4.2 4.3 3.6  Chloride 96 - 106 mmol/L 99 100 96  CO2  20 - 29 mmol/L 25 26 26   Calcium 8.7 - 10.2 mg/dL 9.8 10.1 9.8  Total Protein 6.0 - 8.5 g/dL 7.7 7.9 7.2  Total Bilirubin 0.0 - 1.2 mg/dL 0.4 0.4 0.3  Alkaline Phos 39 - 117 IU/L 63 68 64  AST 0 - 40 IU/L 24 20 20   ALT 0 - 44 IU/L 28 19 20      No results found for this or any previous visit (from the past 72 hour(s)).  No results found.  ASSESSMENT AND PLAN:  Kevin Jarvis was seen today for hypertension.  Diagnoses and all orders for this visit:  Hypertension, unspecified type: See HPI.  Going with Dr. Raul Del recommendation of diastolic today.  He tells me he will take his medication in the future and moving forward as his health this is top priority.  I signed his form stating that he can participate in a physical activity assessment that his company.  After this he will have insurance and I strongly recommend that he get a sleep apnea evaluation as he is relatively young with a modest obesity and has been negative for renal artery stenosis in the past.  Sleep apnea may be driving his hard to control blood pressure and the treatment of that condition would not create erectile dysfunction.  Patient agrees to see Dr. Brigitte Pulse in the next 2 to 3 weeks once his insurance is established so he can talk with her about this.  Other orders -     nebivolol (BYSTOLIC) 5 MG tablet; Take 1 tablet (5 mg total) by mouth daily.    The patient is advised to call or return to clinic if he does not see an improvement in symptoms, or to seek the care of the closest emergency department if he worsens with the above plan.   Philis Fendt, MHS, PA-C [facility] 08/15/2017 1:40 PM

## 2017-08-13 NOTE — Patient Instructions (Addendum)
  Take medications as prescribed.  Come back when your insurance is intact so we can send you for a sleep.    IF you received an x-ray today, you will receive an invoice from [facility]. Please contact [facility] at [phone] with questions or concerns regarding your invoice.   IF you received labwork today, you will receive an invoice from [place]. Please contact [facility] at [phone] with questions or concerns regarding your invoice.   Our billing staff will not be able to assist you with questions regarding bills from these companies.  You will be contacted with the lab results as soon as they are available. The fastest way to get your results is to activate your My Chart account. Instructions are located on the last page of this paperwork. If you have not heard from [place] regarding the results in 2 weeks, please contact this office.

## 2017-08-14 ENCOUNTER — Ambulatory Visit: Payer: Managed Care, Other (non HMO) | Admitting: Family Medicine

## 2017-08-20 ENCOUNTER — Encounter: Payer: Self-pay | Admitting: Physician Assistant

## 2017-08-20 ENCOUNTER — Ambulatory Visit (INDEPENDENT_AMBULATORY_CARE_PROVIDER_SITE_OTHER): Payer: Self-pay | Admitting: Physician Assistant

## 2017-08-20 VITALS — BP 182/94 | HR 77 | Temp 98.3°F | Ht 69.0 in | Wt 210.4 lb

## 2017-08-20 DIAGNOSIS — I10 Essential (primary) hypertension: Secondary | ICD-10-CM

## 2017-08-20 MED ORDER — CHLORTHALIDONE 25 MG PO TABS: 25.0000 mg | ORAL_TABLET | Freq: Every day | ORAL | 1 refills | Status: DC

## 2017-08-20 MED ORDER — CLONIDINE HCL 0.1 MG PO TABS: 0.1000 mg | ORAL_TABLET | Freq: Every day | ORAL | 0 refills | Status: DC | PRN

## 2017-08-20 NOTE — Patient Instructions (Signed)
Start the chlorthalidone 25 mg daily and be preparted to urinate often for the next week.   Eat at least two to three bananas daily while taking this.  Use the clonidine anytime you BP is greater than 160/100.  Come back and see Dr. Brigitte Pulse in about 6 weeks.  Come back for a potassium lab only in about two weeks.

## 2017-08-20 NOTE — Progress Notes (Signed)
08/21/2017 6:41 PM   DOB: 1966/01/26 / MRN: [id number]  SUBJECTIVE:  Kevin Jarvis is a 52 y.o. male presenting for rehceck HTN which worsened with bisoprolol and the erectile dysfunction came back.   Tells me the medication cost him 170 dollars.  He has been unable to pass his work physical despite being cleared by our office.  He tells me "I need to do whatever it takes to get my pressure down." He continues to be asymptomatic.   He has No Known Allergies.   He  has a past medical history of Anxiety, Arthritis, and Hypertension.    He  reports that he has never smoked. He has never used smokeless tobacco. He reports that he does not drink alcohol or use drugs. He  has no sexual activity history on file. The patient  has no past surgical history on file.  His family history is not on file.  Review of Systems  Constitutional: Negative for chills, diaphoresis and fever.  Eyes: Negative.   Respiratory: Negative for cough, hemoptysis, sputum production, shortness of breath and wheezing.   Cardiovascular: Negative for chest pain, orthopnea and leg swelling.  Gastrointestinal: Negative for abdominal pain, blood in stool, constipation, diarrhea, heartburn, melena, nausea and vomiting.  Genitourinary: Negative for dysuria, flank pain, frequency, hematuria and urgency.  Skin: Negative for rash.  Neurological: Negative for dizziness, sensory change, speech change, focal weakness and headaches.    The problem list and medications were reviewed and updated by myself where necessary and exist elsewhere in the encounter.   OBJECTIVE:  BP (!) 182/94 (BP Location: Left Arm, Patient Position: Sitting, Cuff Size: Normal)   Pulse 77   Temp 98.3 F (36.8 C) (Oral)   Ht 5\' 9"  (1.753 m)   Wt 210 lb 6.4 oz (95.4 kg)   SpO2 99%   BMI 31.07 kg/m   BP Readings from Last 3 Encounters:  08/20/17 (!) 182/94  08/13/17 (!) 162/80  06/07/17 (!) 170/88   CMP Latest Ref Rng & Units 04/05/2017 10/24/2016  09/28/2016  Glucose 65 - 99 mg/dL 102(H) 111(H) 102(H)  BUN 6 - 24 mg/dL 14 12 14   Creatinine 0.76 - 1.27 mg/dL 1.16 1.15 1.15  Sodium 134 - 144 mmol/L 139 143 138  Potassium 3.5 - 5.2 mmol/L 4.2 4.3 3.6  Chloride 96 - 106 mmol/L 99 100 96  CO2 20 - 29 mmol/L 25 26 26   Calcium 8.7 - 10.2 mg/dL 9.8 10.1 9.8  Total Protein 6.0 - 8.5 g/dL 7.7 7.9 7.2  Total Bilirubin 0.0 - 1.2 mg/dL 0.4 0.4 0.3  Alkaline Phos 39 - 117 IU/L 63 68 64  AST 0 - 40 IU/L 24 20 20   ALT 0 - 44 IU/L 28 19 20    Physical Exam  Constitutional: He is oriented to person, place, and time. He appears well-developed. He does not appear ill.  Eyes: Pupils are equal, round, and reactive to light. Conjunctivae and EOM are normal.  Cardiovascular: Normal rate, regular rhythm, S1 normal, S2 normal, normal heart sounds, intact distal pulses and normal pulses. Exam reveals no gallop and no friction rub.  No murmur heard. Pulmonary/Chest: Effort normal. No stridor. No respiratory distress. He has no wheezes. He has no rales.  Abdominal: He exhibits no distension.  Musculoskeletal: Normal range of motion. He exhibits no edema.  Neurological: He is alert and oriented to person, place, and time. No cranial nerve deficit. Coordination normal.  Skin: Skin is warm and dry. He is not  diaphoretic.  Psychiatric: He has a normal mood and affect.  Nursing note and vitals reviewed.   No results found for this or any previous visit (from the past 72 hour(s)).  No results found.  ASSESSMENT AND PLAN:  [name] was seen today for hypertension.  Diagnoses and all orders for this visit:  Uncontrolled stage 2 hypertension: Im adding chlorthalidone and for his exam he can take clonidine if pressure greater than per sig.  -     chlorthalidone (HYGROTON) 25 MG tablet; Take 1 tablet (25 mg total) by mouth daily. -     cloNIDine (CATAPRES) 0.1 MG tablet; Take 1 tablet (0.1 mg total) by mouth daily as needed. Take for blood pressure greater  than 160/100. -     Potassium; Future    The patient is advised to call or return to clinic if he does not see an improvement in symptoms, or to seek the care of the closest emergency department if he worsens with the above plan.   Philis Fendt, MHS, PA-C [facility] 08/21/2017 6:41 PM

## 2017-10-27 ENCOUNTER — Encounter: Payer: Managed Care, Other (non HMO) | Admitting: Family Medicine

## 2018-02-02 ENCOUNTER — Encounter: Payer: Self-pay | Admitting: Family Medicine

## 2018-02-03 ENCOUNTER — Encounter: Payer: Self-pay | Admitting: *Deleted

## 2018-11-16 ENCOUNTER — Encounter: Payer: Self-pay | Admitting: Family Medicine

## 2018-11-16 ENCOUNTER — Ambulatory Visit (INDEPENDENT_AMBULATORY_CARE_PROVIDER_SITE_OTHER): Payer: Self-pay | Admitting: Family Medicine

## 2018-11-16 ENCOUNTER — Other Ambulatory Visit: Payer: Self-pay

## 2018-11-16 VITALS — BP 150/98 | HR 92 | Temp 98.7°F | Resp 16 | Wt 214.6 lb

## 2018-11-16 DIAGNOSIS — D509 Iron deficiency anemia, unspecified: Secondary | ICD-10-CM

## 2018-11-16 DIAGNOSIS — I10 Essential (primary) hypertension: Secondary | ICD-10-CM

## 2018-11-16 MED ORDER — CHLORTHALIDONE 25 MG PO TABS: 25.0000 mg | ORAL_TABLET | Freq: Every day | ORAL | 1 refills | Status: DC

## 2018-11-16 MED ORDER — AMLODIPINE BESYLATE 10 MG PO TABS: 10.0000 mg | ORAL_TABLET | Freq: Every day | ORAL | 1 refills | Status: DC

## 2018-11-16 NOTE — Progress Notes (Signed)
Subjective:    Patient ID: Kevin Jarvis, male    DOB: 1965-05-29, 53 y.o.   MRN: [id number]  HPI  Kevin Jarvis is a 53 y.o. male Presents today for: Chief Complaint  Patient presents with  . Establish Care    Been having elevated blood pressure readings. Need a form for work filled out for elevated blood pressure   New patient to me to establish care, previously followed by Philis Fendt with last visit in May 2019.  Uncontrolled hypertension at that time, added chlorthalidone 25 mg daily.  Also recommended using clonidine anytime blood pressure greater than 160/100.  He was advised to have a follow-up visit in 6 weeks at that time as well as a potassium lab only visit in 2 weeks.  Did not proceed with either plan.  Last insurance last March.  BP too high to get job last year.  Plans on new job, physical recently with drug test. BP too high to test. Insurance coverage in next month.   Hypertension: BP Readings from Last 3 Encounters:  11/16/18 (!) 150/98  08/20/17 (!) 182/94  08/13/17 (!) 162/80   Lab Results  Component Value Date   CREATININE 1.16 04/05/2017   As above, history of uncontrolled hypertension Currently taking med from [place]? Did not bring name or bottle. Taking once per day - 1 pill.  - pic sent from home - nifedipine 20mg  qd - last took yesterday 2pm.  Will be buying new BP cuff.   Iron deficiency anemia: Last evaluated in March 2019, recommended iron supplement once per day at that time, as well as follow-up for physical.   Cologuard ordered in 2018 but has not been done. Ferritin of 26 in March 2019 No current supplement.   Lab Results  Component Value Date   WBC 5.0 06/07/2017   HGB 12.9 (L) 06/07/2017   HCT 39.1 06/07/2017   MCV 90 06/07/2017   PLT 239 06/07/2017     Patient Active Problem List   Diagnosis Date Noted  . Iron deficiency anemia 06/07/2017  . Anxiety 12/12/2011  . Long term use of drug 12/12/2011  . Hypertension 12/12/2011   . 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 Headache(784.0) 12/12/2011   Past Medical History:  Diagnosis Date  . Anxiety   . Arthritis   . Hypertension    No past surgical history on file. No Known Allergies Prior to Admission medications   Medication Sig Start Date End Date Taking? Authorizing Provider  amLODipine-olmesartan (AZOR) 10-40 MG tablet Take 1 tablet by mouth daily. 06/07/17  Yes Shawnee Knapp, MD  chlorthalidone (HYGROTON) 25 MG tablet Take 1 tablet (25 mg total) by mouth daily. 08/20/17  Yes Tereasa Coop, PA-C  cloNIDine (CATAPRES) 0.1 MG tablet Take 1 tablet (0.1 mg total) by mouth daily as needed. Take for blood pressure greater than 160/100. 08/20/17  Yes Tereasa Coop, PA-C  nebivolol (BYSTOLIC) 5 MG tablet Take 1 tablet (5 mg total) by mouth daily. 08/13/17  Yes Tereasa Coop, PA-C   Social History   Socioeconomic History  . Marital status: Single    Spouse name: Not on file  . Number of children: Not on file  . Years of education: Not on file  . Highest education level: Not on file  Occupational History  . Not on file  Social Needs  . Financial resource strain: Not on file  . Food insecurity    Worry: Not on file    Inability:  Not on file  . Transportation needs    Medical: Not on file    Non-medical: Not on file  Tobacco Use  . Smoking status: Never Smoker  . Smokeless tobacco: Never Used  Substance and Sexual Activity  . Alcohol use: No  . Drug use: No  . Sexual activity: Not on file  Lifestyle  . Physical activity    Days per week: Not on file    Minutes per session: Not on file  . Stress: Not on file  Relationships  . Social Herbalist on phone: Not on file    Gets together: Not on file    Attends religious service: Not on file    Active member of club or organization: Not on file    Attends meetings of clubs or organizations: Not on file    Relationship status: Not on file  . Intimate partner violence    Fear of current  or ex partner: Not on file    Emotionally abused: Not on file    Physically abused: Not on file    Forced sexual activity: Not on file  Other Topics Concern  . Not on file  Social History Narrative   From [place] - Moved to [place] 2000   Lives with wife and son    Review of Systems  Constitutional: Negative for fatigue and unexpected weight change.  Eyes: Negative for visual disturbance.  Respiratory: Negative for cough, chest tightness and shortness of breath.   Cardiovascular: Negative for chest pain, palpitations and leg swelling.  Gastrointestinal: Negative for abdominal pain and blood in stool.  Neurological: Negative for dizziness, light-headedness and headaches.       Objective:   Physical Exam Vitals signs reviewed.  Constitutional:      Appearance: He is well-developed.  HENT:     Head: Normocephalic and atraumatic.  Eyes:     Pupils: Pupils are equal, round, and reactive to light.  Neck:     Vascular: No carotid bruit or JVD.  Cardiovascular:     Rate and Rhythm: Normal rate and regular rhythm.     Heart sounds: Normal heart sounds. No murmur.  Pulmonary:     Effort: Pulmonary effort is normal.     Breath sounds: Normal breath sounds. No rales.  Skin:    General: Skin is warm and dry.  Neurological:     Mental Status: He is alert and oriented to person, place, and time.    Vitals:   11/16/18 0939 11/16/18 0941  BP: (!) 170/98 (!) 150/98  Pulse: 92   Resp: 16   Temp: 98.7 F (37.1 C)   TempSrc: Oral   SpO2: 92%   Weight: 214 lb 9.6 oz (97.3 kg)          Assessment & Plan:   Kevin Jarvis is a 53 y.o. male Essential hypertension - Plan: Basic metabolic panel, amLODipine (NORVASC) 10 MG tablet, chlorthalidone (HYGROTON) 25 MG tablet  -Uncontrolled, but only on nifedipine 20 mg once per day, which appears to be short acting version.  Last taken yesterday afternoon.  Asymptomatic.  Previously on multiple medications, but does not appear to need that  much at this point given recheck reading.  -Start amlodipine in place of nifedipine, 10 mg daily.  -Monitor home readings over the next 3 to 4 days, and if still elevated can add chlorthalidone.  Orthostasis/hypotensive precautions given.  -Copy of readings over the next week to be brought to the office with  follow-up visit in person in 2 weeks.  Iron deficiency anemia, unspecified iron deficiency anemia type - Plan: CBC  -Not currently on iron.  Asymptomatic, check CBC and follow-up in a few weeks to discuss further.  Meds ordered this encounter  Medications  . amLODipine (NORVASC) 10 MG tablet    Sig: Take 1 tablet (10 mg total) by mouth daily.    Dispense:  30 tablet    Refill:  1  . chlorthalidone (HYGROTON) 25 MG tablet    Sig: Take 1 tablet (25 mg total) by mouth daily.    Dispense:  30 tablet    Refill:  1   Patient Instructions    Stop the nifedipine.  Start amlodipine 1 pill/day.  Monitor your blood pressures every day and in the next 3 to 4 days IF those blood pressures remain over 140/90, start chlorthalidone as well once per day.  Bring a copy of your home blood pressure readings to me in the next week if possible so we can decide on other changes or another visit.  Return to the clinic or go to the nearest emergency room if any of your symptoms worsen or new symptoms occur.  I will check some blood work today, but follow-up in the next 2 weeks to discuss the blood counts further and recheck your blood pressure.  If you have lab work done today you will be contacted with your lab results within the next 2 weeks.  If you have not heard from [place] then please contact us. The fastest way to get your results is to register for My Chart.   IF you received an x-ray today, you will receive an invoice from [facility]iology. Please contact [facility] Radiology at [phone] with questions or concerns regarding your invoice.   IF you received labwork today, you will receive an  invoice from [place]. Please contact [facility] at [phone] with questions or concerns regarding your invoice.   Our billing staff will not be able to assist you with questions regarding bills from these companies.  You will be contacted with the lab results as soon as they are available. The fastest way to get your results is to activate your My Chart account. Instructions are located on the last page of this paperwork. If you have not heard from [place] regarding the results in 2 weeks, please contact this office.       Signed,   Merri Ray, MD [facility] [place].  11/16/18 12:49 PM

## 2018-11-16 NOTE — Patient Instructions (Addendum)
  Stop the nifedipine.  Start amlodipine 1 pill/day.  Monitor your blood pressures every day and in the next 3 to 4 days IF those blood pressures remain over 140/90, start chlorthalidone as well once per day.  Bring a copy of your home blood pressure readings to me in the next week if possible so we can decide on other changes or another visit.  Return to the clinic or go to the nearest emergency room if any of your symptoms worsen or new symptoms occur.  I will check some blood work today, but follow-up in the next 2 weeks to discuss the blood counts further and recheck your blood pressure.  If you have lab work done today you will be contacted with your lab results within the next 2 weeks.  If you have not heard from [place] then please contact us. The fastest way to get your results is to register for My Chart.   IF you received an x-ray today, you will receive an invoice from [facility] Radiology. Please contact [facility] Radiology at [phone] with questions or concerns regarding your invoice.   IF you received labwork today, you will receive an invoice from [place]. Please contact [facility] at [phone] with questions or concerns regarding your invoice.   Our billing staff will not be able to assist you with questions regarding bills from these companies.  You will be contacted with the lab results as soon as they are available. The fastest way to get your results is to activate your My Chart account. Instructions are located on the last page of this paperwork. If you have not heard from [place] regarding the results in 2 weeks, please contact this office.

## 2018-11-17 LAB — CBC
Hematocrit: 40.6 % (ref 37.5–51.0)
Hemoglobin: 13.8 g/dL (ref 13.0–17.7)
MCH: 29.3 pg (ref 26.6–33.0)
MCHC: 34 g/dL (ref 31.5–35.7)
MCV: 86 fL (ref 79–97)
Platelets: 272 x10E3/uL (ref 150–450)
RBC: 4.71 x10E6/uL (ref 4.14–5.80)
RDW: 11 % — ABNORMAL LOW (ref 11.6–15.4)
WBC: 4.7 x10E3/uL (ref 3.4–10.8)

## 2018-11-17 LAB — BASIC METABOLIC PANEL
BUN/Creatinine Ratio: 12 (ref 9–20)
BUN: 13 mg/dL (ref 6–24)
CO2: 25 mmol/L (ref 20–29)
Calcium: 10 mg/dL (ref 8.7–10.2)
Chloride: 101 mmol/L (ref 96–106)
Creatinine, Ser: 1.1 mg/dL (ref 0.76–1.27)
GFR calc Af Amer: 88 mL/min/{1.73_m2} (ref >59)
GFR calc non Af Amer: 76 mL/min/{1.73_m2} (ref >59)
Glucose: 130 mg/dL — ABNORMAL HIGH (ref 65–99)
Potassium: 4.2 mmol/L (ref 3.5–5.2)
Sodium: 141 mmol/L (ref 134–144)

## 2018-11-30 ENCOUNTER — Ambulatory Visit (INDEPENDENT_AMBULATORY_CARE_PROVIDER_SITE_OTHER): Payer: Self-pay | Admitting: Family Medicine

## 2018-11-30 ENCOUNTER — Other Ambulatory Visit: Payer: Self-pay

## 2018-11-30 VITALS — BP 168/66 | HR 104 | Temp 98.4°F | Ht 69.0 in | Wt 214.2 lb

## 2018-11-30 DIAGNOSIS — I10 Essential (primary) hypertension: Secondary | ICD-10-CM

## 2018-11-30 DIAGNOSIS — R739 Hyperglycemia, unspecified: Secondary | ICD-10-CM

## 2018-11-30 MED ORDER — METOPROLOL TARTRATE 25 MG PO TABS: 25.0000 mg | ORAL_TABLET | Freq: Two times a day (BID) | ORAL | 1 refills | Status: DC

## 2018-11-30 NOTE — Patient Instructions (Addendum)
   Continue amlodipine, chlorthalidone at same doses.  Start metoprolol 1 pill twice per day.  Monitor home blood pressure readings if possible and recheck in the next 2 weeks.  We can increase the dose of medication or add additional medicine at that time if blood pressure remains elevated.  Return to the clinic or go to the nearest emergency room if any of your symptoms worsen or new symptoms occur.   If you have lab work done today you will be contacted with your lab results within the next 2 weeks.  If you have not heard from [place] then please contact us. The fastest way to get your results is to register for My Chart.   IF you received an x-ray today, you will receive an invoice from [facility]iology. Please contact [facility] Radiology at [phone] with questions or concerns regarding your invoice.   IF you received labwork today, you will receive an invoice from [place]. Please contact [facility] at [phone] with questions or concerns regarding your invoice.   Our billing staff will not be able to assist you with questions regarding bills from these companies.  You will be contacted with the lab results as soon as they are available. The fastest way to get your results is to activate your My Chart account. Instructions are located on the last page of this paperwork. If you have not heard from [place] regarding the results in 2 weeks, please contact this office.

## 2018-11-30 NOTE — Progress Notes (Signed)
Subjective:    Patient ID: Kevin Jarvis, male    DOB: [birth year], 53 y.o.   MRN: [id number]  HPI Kevin Jarvis is a 53 y.o. male Presents today for: Chief Complaint  Patient presents with  . Hypertension  . lab review    Hypertension: BP Readings from Last 3 Encounters:  11/30/18 (!) 168/60  11/16/18 (!) 150/98  08/20/17 (!) 182/94   Lab Results  Component Value Date   CREATININE 1.10 11/16/2018   See last visit.  Appear to be on nifedipine 20 mg once per day with home meds but short acting version only.  Started amlodipine in place of nifedipine 10 mg daily.  Option of adding chlorthalidone 25 mg daily if persistent elevated readings at home.  Taking amlodipine 10mg  QD, and chlorthalidone 25mg  qd. Took both today  - no missed doses.  Home BP machine 200? Stopped using. not sure it is working.   Evaluated by cardiology in 2018 for difficulty control hypertension heart murmur.  Echocardiogram 01/02/2017 indicated moderate concentric hypertrophy of the left ventricle.  Mild to moderate MR without definite prolapse, grade 2 diastolic function with preserved left ventricular ejection fraction.  Renal duplex revealed no evidence of renal artery stenosis.  EKG in 2018 with LVH, repolarization abnormality.  Ultimately regimen for blood pressure at that time include Inspra, Catapres, triamterene HCTZ, Bystolic, amlodipine. 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 No HA/blurry vision. Drinks water frequently with factory work. No increased thirst.   2yo and 71mo son at home.       Patient Active Problem List   Diagnosis Date Noted  . Iron deficiency anemia 06/07/2017  . Anxiety 12/12/2011  . Long term use of drug 12/12/2011  . Hypertension 12/12/2011  . 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 Headache(784.0) 12/12/2011   Past  Medical History:  Diagnosis Date  . Anxiety   . Arthritis   . Hypertension    No past surgical history on file. No Known Allergies Prior to Admission medications   Medication Sig Start Date End Date Taking? Authorizing Provider  amLODipine-olmesartan (AZOR) 10-40 MG tablet Take 1 tablet by mouth daily. 06/07/17  Yes Shawnee Knapp, MD  amLODipine (NORVASC) 10 MG tablet Take 1 tablet (10 mg total) by mouth daily. Patient not taking: Reported on 11/30/2018 11/16/18   Wendie Agreste, MD  chlorthalidone (HYGROTON) 25 MG tablet Take 1 tablet (25 mg total) by mouth daily. Patient not taking: Reported on 11/30/2018 11/16/18   Wendie Agreste, MD  cloNIDine (CATAPRES) 0.1 MG tablet Take 1 tablet (0.1 mg total) by mouth daily as needed. Take for blood pressure greater than 160/100. Patient not taking: Reported on 11/30/2018 08/20/17   Tereasa Coop, PA-C  nebivolol (BYSTOLIC) 5 MG tablet Take 1 tablet (5 mg total) by mouth daily. Patient not taking: Reported on 11/30/2018 08/13/17   Tereasa Coop, PA-C   Social History   Socioeconomic History  . Marital status: Single    Spouse name: Not on file  . Number of children: Not on file  . Years of education: Not on file  . Highest education level: Not on file  Occupational History  . Not on file  Social Needs  . Financial resource strain: Not on file  . Food insecurity    Worry: Not on file    Inability:  Not on file  . Transportation needs    Medical: Not on file    Non-medical: Not on file  Tobacco Use  . Smoking status: Never Smoker  . Smokeless tobacco: Never Used  Substance and Sexual Activity  . Alcohol use: No  . Drug use: No  . Sexual activity: Not on file  Lifestyle  . Physical activity    Days per week: Not on file    Minutes per session: Not on file  . Stress: Not on file  Relationships  . Social Herbalist on phone: Not on file    Gets together: Not on file    Attends religious service: Not on file    Active  member of club or organization: Not on file    Attends meetings of clubs or organizations: Not on file    Relationship status: Not on file  . Intimate partner violence    Fear of current or ex partner: Not on file    Emotionally abused: Not on file    Physically abused: Not on file    Forced sexual activity: Not on file  Other Topics Concern  . Not on file  Social History Narrative   From [place] - Moved to [place] 2000   Lives with wife and son    Review of Systems  Constitutional: Negative for fatigue and unexpected weight change.  Eyes: Negative for visual disturbance.  Respiratory: Negative for cough, chest tightness and shortness of breath.   Cardiovascular: Negative for chest pain, palpitations and leg swelling.  Gastrointestinal: Negative for abdominal pain and blood in stool.  Neurological: Negative for dizziness, light-headedness and headaches.       Objective:   Physical Exam Vitals signs reviewed.  Constitutional:      Appearance: He is well-developed.  HENT:     Head: Normocephalic and atraumatic.  Eyes:     Pupils: Pupils are equal, round, and reactive to light.  Neck:     Vascular: No carotid bruit or JVD.  Cardiovascular:     Rate and Rhythm: Normal rate and regular rhythm.     Heart sounds: Normal heart sounds. No murmur.  Pulmonary:     Effort: Pulmonary effort is normal.     Breath sounds: Normal breath sounds. No rales.  Skin:    General: Skin is warm and dry.  Neurological:     General: No focal deficit present.     Mental Status: He is alert and oriented to person, place, and time.     Motor: No weakness.     Gait: Gait normal.    Vitals:   11/30/18 1442 11/30/18 1526  BP: (!) 168/60 (!) 168/66  Pulse: (!) 104   Temp: 98.4 F (36.9 C)   TempSrc: Oral   SpO2: 96%   Weight: 214 lb 3.2 oz (97.2 kg)   Height: 5\' 9"  (1.753 m)    EKG: SR, rate 66, lateral negative t waves seen in 2018, appears to have TWI II, III avf new from that time.    Reviewed with his previous cardiologist, no significant changes from previous. LVH with repolarization abnormality, possibly hypertensive heart disease.     Assessment & Plan:   Kevin Jarvis is a 53 y.o. male Hyperglycemia - Plan: Hemoglobin A1c  -Slight hyperglycemia on previous labs, check A1c, asymptomatic  Essential hypertension - Plan: Basic metabolic panel, EKG [id number], metoprolol tartrate (LOPRESSOR) 25 MG tablet  -EKG reviewed, likely hypertensive heart disease without symptoms.  No  apparent need for cardiology follow-up at this time unless new heart murmur or symptomatic.  -Continue amlodipine, chlorthalidone same doses for now, add metoprolol 25 mg twice daily, recheck in 2 weeks with home readings, RTC/ER precautions given sooner if needed.  Meds ordered this encounter  Medications  . metoprolol tartrate (LOPRESSOR) 25 MG tablet    Sig: Take 1 tablet (25 mg total) by mouth 2 (two) times daily.    Dispense:  60 tablet    Refill:  1   Patient Instructions     Continue amlodipine, chlorthalidone at same doses.  Start metoprolol 1 pill twice per day.  Monitor home blood pressure readings if possible and recheck in the next 2 weeks.  We can increase the dose of medication or add additional medicine at that time if blood pressure remains elevated.  Return to the clinic or go to the nearest emergency room if any of your symptoms worsen or new symptoms occur.   If you have lab work done today you will be contacted with your lab results within the next 2 weeks.  If you have not heard from [place] then please contact us. The fastest way to get your results is to register for My Chart.   IF you received an x-ray today, you will receive an invoice from [facility] Radiology. Please contact [facility] Radiology at [phone] with questions or concerns regarding your invoice.   IF you received labwork today, you will receive an invoice from [place]. Please contact [facility] at  [phone] with questions or concerns regarding your invoice.   Our billing staff will not be able to assist you with questions regarding bills from these companies.  You will be contacted with the lab results as soon as they are available. The fastest way to get your results is to activate your My Chart account. Instructions are located on the last page of this paperwork. If you have not heard from [place] regarding the results in 2 weeks, please contact this office.       Signed,   Merri Ray, MD [facility] at [place].  12/01/18 9:47 PM

## 2018-12-01 ENCOUNTER — Encounter: Payer: Self-pay | Admitting: Family Medicine

## 2018-12-01 LAB — BASIC METABOLIC PANEL
BUN/Creatinine Ratio: 16 (ref 9–20)
BUN: 17 mg/dL (ref 6–24)
CO2: 26 mmol/L (ref 20–29)
Calcium: 10 mg/dL (ref 8.7–10.2)
Chloride: 98 mmol/L (ref 96–106)
Creatinine, Ser: 1.07 mg/dL (ref 0.76–1.27)
GFR calc Af Amer: 90 mL/min/{1.73_m2} (ref >59)
GFR calc non Af Amer: 78 mL/min/{1.73_m2} (ref >59)
Glucose: 130 mg/dL — ABNORMAL HIGH (ref 65–99)
Potassium: 3.6 mmol/L (ref 3.5–5.2)
Sodium: 138 mmol/L (ref 134–144)

## 2018-12-01 LAB — HEMOGLOBIN A1C
Est. average glucose Bld gHb Est-mCnc: 120 mg/dL
Hgb A1c MFr Bld: 5.8 % — ABNORMAL HIGH (ref 4.8–5.6)

## 2018-12-02 ENCOUNTER — Encounter: Payer: Self-pay | Admitting: Radiology

## 2018-12-03 ENCOUNTER — Ambulatory Visit: Payer: 59 | Admitting: Internal Medicine

## 2018-12-14 ENCOUNTER — Ambulatory Visit (INDEPENDENT_AMBULATORY_CARE_PROVIDER_SITE_OTHER): Payer: Self-pay | Admitting: Family Medicine

## 2018-12-14 ENCOUNTER — Other Ambulatory Visit: Payer: Self-pay

## 2018-12-14 ENCOUNTER — Encounter: Payer: Self-pay | Admitting: Family Medicine

## 2018-12-14 VITALS — BP 145/80 | HR 82 | Temp 98.6°F | Resp 14 | Wt 216.0 lb

## 2018-12-14 DIAGNOSIS — I10 Essential (primary) hypertension: Secondary | ICD-10-CM

## 2018-12-14 MED ORDER — METOPROLOL TARTRATE 25 MG PO TABS: 25.0000 mg | ORAL_TABLET | Freq: Two times a day (BID) | ORAL | 1 refills | Status: DC

## 2018-12-14 MED ORDER — CHLORTHALIDONE 25 MG PO TABS: 25.0000 mg | ORAL_TABLET | Freq: Every day | ORAL | 1 refills | Status: DC

## 2018-12-14 MED ORDER — AMLODIPINE BESYLATE 10 MG PO TABS: 10.0000 mg | ORAL_TABLET | Freq: Every day | ORAL | 1 refills | Status: DC

## 2018-12-14 NOTE — Progress Notes (Signed)
Subjective:    Patient ID: Kevin Jarvis, male    DOB: Jul 25, 1965, 53 y.o.   MRN: [id number]  HPI Kevin Jarvis is a 53 y.o. male Presents today for: Chief Complaint  Patient presents with  . Hypertension    2 wk f/u on bp. Patient stated he just got off work and worked very hard today   Hypertension: BP Readings from Last 3 Encounters:  12/14/18 (!) 145/80  11/30/18 (!) 168/66  11/16/18 (!) 150/98   Lab Results  Component Value Date   CREATININE 1.07 11/30/2018  See previous visits. Continued amlodipine, chlorthalidone last visit, metoprolol 25 mg twice daily started.  EKG with likely hypertensive heart disease.  Plan for cardiology follow-up if symptomatic or new heart murmurs which he did not have.  Slight dizzy for about 84min, then resolved. Otherwise feels fine. Working hard - factory work/physical work - feels fine there - no near syncopal symptoms.  Still not happy with home BP cuff. No readings. No added salt to food.    Patient Active Problem List   Diagnosis Date Noted  . Iron deficiency anemia 06/07/2017  . Anxiety 12/12/2011  . Long term use of drug 12/12/2011  . Hypertension 12/12/2011  . 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 Headache(784.0) 12/12/2011   Past Medical History:  Diagnosis Date  . Anxiety   . Arthritis   . Hypertension    No past surgical history on file. No Known Allergies Prior to Admission medications   Medication Sig Start Date End Date Taking? Authorizing Provider  amLODipine (NORVASC) 10 MG tablet Take 1 tablet (10 mg total) by mouth daily. 11/16/18  Yes Wendie Agreste, MD  chlorthalidone (HYGROTON) 25 MG tablet Take 1 tablet (25 mg total) by mouth daily. 11/16/18  Yes Wendie Agreste, MD  metoprolol tartrate (LOPRESSOR) 25 MG tablet Take 1 tablet (25 mg total) by mouth 2 (two) times daily. 11/30/18  Yes Wendie Agreste, MD   Social History   Socioeconomic History  . Marital status: Single    Spouse name: Not on file   . Number of children: Not on file  . Years of education: Not on file  . Highest education level: Not on file  Occupational History  . Not on file  Social Needs  . Financial resource strain: Not on file  . Food insecurity    Worry: Not on file    Inability: Not on file  . Transportation needs    Medical: Not on file    Non-medical: Not on file  Tobacco Use  . Smoking status: Never Smoker  . Smokeless tobacco: Never Used  Substance and Sexual Activity  . Alcohol use: No  . Drug use: No  . Sexual activity: Not on file  Lifestyle  . Physical activity    Days per week: Not on file    Minutes per session: Not on file  . Stress: Not on file  Relationships  . Social Herbalist on phone: Not on file    Gets together: Not on file    Attends religious service: Not on file    Active member of club or organization: Not on file    Attends meetings of clubs or organizations: Not on file    Relationship status: Not on file  . Intimate partner violence    Fear of current or ex partner: Not on file    Emotionally abused: Not on file    Physically abused: Not on  file    Forced sexual activity: Not on file  Other Topics Concern  . Not on file  Social History Narrative   From [place] - Moved to [place] 2000   Lives with wife and son    Review of Systems  Constitutional: Negative for fatigue and unexpected weight change.  Eyes: Negative for visual disturbance.  Respiratory: Negative for cough, chest tightness and shortness of breath.   Cardiovascular: Negative for chest pain, palpitations and leg swelling.  Gastrointestinal: Negative for abdominal pain and blood in stool.  Neurological: Negative for dizziness (only initially after taking metoprolol as above. ), light-headedness and headaches.      Objective:   Physical Exam Vitals signs reviewed.  Constitutional:      Appearance: He is well-developed.  HENT:     Head: Normocephalic and atraumatic.  Eyes:     Pupils:  Pupils are equal, round, and reactive to light.  Neck:     Vascular: No carotid bruit or JVD.  Cardiovascular:     Rate and Rhythm: Normal rate and regular rhythm.     Heart sounds: Normal heart sounds. No murmur.  Pulmonary:     Effort: Pulmonary effort is normal.     Breath sounds: Normal breath sounds. No rales.  Skin:    General: Skin is warm and dry.  Neurological:     Mental Status: He is alert and oriented to person, place, and time.    Vitals:   12/14/18 1514 12/14/18 1524  BP: (!) 180/80 (!) 145/80  Pulse: 82   Resp: 14   Temp: 98.6 F (37 C)   TempSrc: Oral   SpO2: 98%   Weight: 216 lb (98 kg)        Assessment & Plan:  Kevin Jarvis is a 53 y.o. male Essential hypertension  -Improving, near 140/90 or below.  Option of additional half dose of metoprolol twice per day with dizziness/lightheadedness precautions if he does that higher dose.  If that occurs follow-up to discuss change in regimen.  Recheck 3 months.  Update on home readings in the next few weeks.   No orders of the defined types were placed in this encounter.  Patient Instructions    Blood pressure is closer to normal. IF over 140/90 at home can try additional 1/2 pill of metoprolol twice per day.  IF any new lightheadedness or dizziness at higher dose - return to prior dose and let me know.   Let me know how your readings are doing in next 2 weeks. If you need to take additional 1/2 of metoprolol - I will need to send in more meds.    If you have lab work done today you will be contacted with your lab results within the next 2 weeks.  If you have not heard from [place] then please contact us. The fastest way to get your results is to register for My Chart.   IF you received an x-ray today, you will receive an invoice from [facility]. Please contact [facility] at [phone] with questions or concerns regarding your invoice.   IF you received labwork today, you will receive an  invoice from [place]. Please contact [facility] at [phone] with questions or concerns regarding your invoice.   Our billing staff will not be able to assist you with questions regarding bills from these companies.  You will be contacted with the lab results as soon as they are available. The fastest way to get your results is to activate  your My Chart account. Instructions are located on the last page of this paperwork. If you have not heard from [place] regarding the results in 2 weeks, please contact this office.        Signed,   Merri Ray, MD [facility].  12/14/18 3:58 PM

## 2018-12-14 NOTE — Patient Instructions (Addendum)
  Blood pressure is closer to normal. IF over 140/90 at home can try additional 1/2 pill of metoprolol twice per day.  IF any new lightheadedness or dizziness at higher dose - return to prior dose and let me know.   Let me know how your readings are doing in next 2 weeks. If you need to take additional 1/2 of metoprolol - I will need to send in more meds.    If you have lab work done today you will be contacted with your lab results within the next 2 weeks.  If you have not heard from [place] then please contact us. The fastest way to get your results is to register for My Chart.   IF you received an x-ray today, you will receive an invoice from [facility] Radiology. Please contact [facility] Radiology at [phone] with questions or concerns regarding your invoice.   IF you received labwork today, you will receive an invoice from [place]. Please contact [facility] at [phone] with questions or concerns regarding your invoice.   Our billing staff will not be able to assist you with questions regarding bills from these companies.  You will be contacted with the lab results as soon as they are available. The fastest way to get your results is to activate your My Chart account. Instructions are located on the last page of this paperwork. If you have not heard from [place] regarding the results in 2 weeks, please contact this office.

## 2018-12-15 ENCOUNTER — Encounter: Payer: Self-pay | Admitting: Family Medicine

## 2019-03-15 ENCOUNTER — Ambulatory Visit: Payer: Medicaid Other | Admitting: Family Medicine

## 2019-03-16 ENCOUNTER — Encounter: Payer: Self-pay | Admitting: Family Medicine

## 2019-09-15 ENCOUNTER — Other Ambulatory Visit: Payer: Self-pay

## 2019-09-15 ENCOUNTER — Ambulatory Visit: Payer: Medicaid Other | Admitting: Family Medicine

## 2019-09-15 DIAGNOSIS — I10 Essential (primary) hypertension: Secondary | ICD-10-CM

## 2019-09-15 MED ORDER — AMLODIPINE BESYLATE 10 MG PO TABS: 10.0000 mg | ORAL_TABLET | Freq: Every day | ORAL | 1 refills | Status: DC

## 2019-09-24 ENCOUNTER — Other Ambulatory Visit: Payer: Self-pay

## 2019-09-24 ENCOUNTER — Encounter: Payer: Self-pay | Admitting: Family Medicine

## 2019-09-24 ENCOUNTER — Ambulatory Visit: Payer: BC Managed Care – PPO | Admitting: Family Medicine

## 2019-09-24 ENCOUNTER — Ambulatory Visit (INDEPENDENT_AMBULATORY_CARE_PROVIDER_SITE_OTHER): Payer: BC Managed Care – PPO

## 2019-09-24 VITALS — BP 178/96 | HR 77 | Temp 98.4°F | Ht 69.0 in | Wt 222.0 lb

## 2019-09-24 DIAGNOSIS — R600 Localized edema: Secondary | ICD-10-CM

## 2019-09-24 DIAGNOSIS — R351 Nocturia: Secondary | ICD-10-CM

## 2019-09-24 DIAGNOSIS — R739 Hyperglycemia, unspecified: Secondary | ICD-10-CM | POA: Diagnosis not present

## 2019-09-24 DIAGNOSIS — Z1211 Encounter for screening for malignant neoplasm of colon: Secondary | ICD-10-CM

## 2019-09-24 DIAGNOSIS — R0609 Other forms of dyspnea: Secondary | ICD-10-CM

## 2019-09-24 DIAGNOSIS — R9431 Abnormal electrocardiogram [ECG] [EKG]: Secondary | ICD-10-CM

## 2019-09-24 DIAGNOSIS — R0600 Dyspnea, unspecified: Secondary | ICD-10-CM

## 2019-09-24 DIAGNOSIS — I10 Essential (primary) hypertension: Secondary | ICD-10-CM

## 2019-09-24 DIAGNOSIS — R040 Epistaxis: Secondary | ICD-10-CM

## 2019-09-24 DIAGNOSIS — Z131 Encounter for screening for diabetes mellitus: Secondary | ICD-10-CM | POA: Diagnosis not present

## 2019-09-24 LAB — POCT URINALYSIS DIP (MANUAL ENTRY)
Bilirubin, UA: NEGATIVE
Glucose, UA: NEGATIVE mg/dL
Ketones, POC UA: NEGATIVE mg/dL
Leukocytes, UA: NEGATIVE
Nitrite, UA: NEGATIVE
Protein Ur, POC: 30 mg/dL — AB
Spec Grav, UA: 1.025 (ref 1.010–1.025)
Urobilinogen, UA: 0.2 E.U./dL
pH, UA: 6 (ref 5.0–8.0)

## 2019-09-24 LAB — POC MICROSCOPIC URINALYSIS (UMFC): Mucus: ABSENT

## 2019-09-24 LAB — GLUCOSE, POCT (MANUAL RESULT ENTRY): POC Glucose: 143 mg/dl — AB (ref 70–99)

## 2019-09-24 MED ORDER — METOPROLOL TARTRATE 25 MG PO TABS: 25.0000 mg | ORAL_TABLET | Freq: Two times a day (BID) | ORAL | 1 refills | Status: DC

## 2019-09-24 NOTE — Patient Instructions (Addendum)
I will check a prostate test and urine test for the nighttime urination.  Try to avoid drinking fluids within an hour or 2 at bedtime.  Make sure to stay hydrated during the day.  Continue amlodipine, restart metoprolol for your blood pressure.  Keep a record of your blood pressure readings for the next visit.  Let me know if any new side effects with that combination.  I will refer you to ear nose and throat doctor for the bleeding in the nose but I do not see any concerns on exam today.  I will refer you to gastroenterology for colonoscopy.  If any worsening shortness of breath, chest pains or other acute worsening symptoms be seen here or the emergency room right away.  I will check some other blood work for the swelling in the legs as well as other symptoms and referred you back to heart doctor.  Recheck in the next 2 weeks.     If you have lab work done today you will be contacted with your lab results within the next 2 weeks.  If you have not heard from [place] then please contact us. The fastest way to get your results is to register for My Chart.   IF you received an x-ray today, you will receive an invoice from [facility] Radiology. Please contact [facility] Radiology at [phone] with questions or concerns regarding your invoice.   IF you received labwork today, you will receive an invoice from [place]. Please contact [facility] at [phone] with questions or concerns regarding your invoice.   Our billing staff will not be able to assist you with questions regarding bills from these companies.  You will be contacted with the lab results as soon as they are available. The fastest way to get your results is to activate your My Chart account. Instructions are located on the last page of this paperwork. If you have not heard from [place] regarding the results in 2 weeks, please contact this office.

## 2019-09-24 NOTE — Progress Notes (Signed)
Subjective:  Patient ID: Kevin Jarvis, male    DOB: 02-Mar-1966  Age: 54 y.o. MRN: [id number]  CC:  Chief Complaint  Patient presents with  . Hypertension    Pt reports no issues with this condition no physical symptoms per pt. pt also reports taking medication as directed.  . nocternal urination    Pt reports at night he isn't getting much sleep because he seem to have to get up to urinate every 30 mins  . Shortness of Breath    Pt reports he seems to get SOB when he is around cold air. pt also reports in the winter time when he blows his nose he sees blood in the mucus.    HPI Kevin Jarvis presents for  Multiple concerns as above.  Hypertension: Has been treated with chlorthalidone 25 mg daily, metoprolol 25 mg twice daily, amlodipine 10 mg daily.  Option of additional half dose of metoprolol twice daily discussed at September 2020 visit. Stopped chlorthalidone on his own - feels drunk when taking it, felt dizzy. Off med for 9 months.  Not taking metoprolol - he did not get this rx.  Only taking amlodipine 10mg  qd. Home readings: 180-189/80's.  Leg swelling past 2 months.  Denies current dyspnea on exertion. Some shortness of breath wearing mask.   no chest pain,.  Previously evaluated by Dr. Einar Gip with [facility] cardiovascular.  No recent visit. BP Readings from Last 3 Encounters:  09/24/19 (!) 178/96  12/14/18 (!) 145/80  11/30/18 (!) 168/66   Lab Results  Component Value Date   CREATININE 1.07 11/30/2018   Nocturia Up to every 50mins at times. 10 times at night.  Past 5 months.  No change in thirst/frequency during the day. No dysuria. No hematuria.  Hx of prediabetes, last A1c 5.8 in 10/2018.   Dyspnea:  Past 6-7 months, notes with cold weather, or when room is cold.  No chest pains.  No fever, cough, or wheezing. No hx of asthma.   Epistaxis: Notes when cold weather and blowing nose. Every winter, and when in cold situations. Sx's for years. Strings of blood.  Not having to apply pressure.  Noticed 5 days ago in air conditioning, or when drinking cold drinks.     History Patient Active Problem List   Diagnosis Date Noted  . Iron deficiency anemia 06/07/2017  . Anxiety 12/12/2011  . Long term use of drug 12/12/2011  . Hypertension 12/12/2011  . 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 Headache(784.0) 12/12/2011   Past Medical History:  Diagnosis Date  . Anxiety   . Arthritis   . Hypertension    No past surgical history on file. No Known Allergies Prior to Admission medications   Medication Sig Start Date End Date Taking? Authorizing Provider  amLODipine (NORVASC) 10 MG tablet Take 1 tablet (10 mg total) by mouth daily. 09/15/19  Yes Wendie Agreste, MD  metoprolol tartrate (LOPRESSOR) 25 MG tablet Take 1 tablet (25 mg total) by mouth 2 (two) times daily. 12/14/18  Yes Wendie Agreste, MD  chlorthalidone (HYGROTON) 25 MG tablet Take 1 tablet (25 mg total) by mouth daily. Patient not taking: Reported on 09/24/2019 12/14/18   Wendie Agreste, MD   Social History   Socioeconomic History  . Marital status: Single    Spouse name: Not on file  . Number of children: Not on file  . Years of education: Not on file  . Highest education level: Not on file  Occupational History  .  Not on file  Tobacco Use  . Smoking status: Never Smoker  . Smokeless tobacco: Never Used  Substance and Sexual Activity  . Alcohol use: No  . Drug use: No  . Sexual activity: Not on file  Other Topics Concern  . Not on file  Social History Narrative   From [place] - Moved to [place] 2000   Lives with wife and son   Social Determinants of Health   Financial Resource Strain:   . Difficulty of Paying Living Expenses:   Food Insecurity:   . Worried About Charity fundraiser in the Last Year:   . Arboriculturist in the Last Year:   Transportation Needs:   . Film/video editor (Medical):   Marland Kitchen Lack of Transportation (Non-Medical):   Physical Activity:     . Days of Exercise per Week:   . Minutes of Exercise per Session:   Stress:   . Feeling of Stress :   Social Connections:   . Frequency of Communication with Friends and Family:   . Frequency of Social Gatherings with Friends and Family:   . Attends Religious Services:   . Active Member of Clubs or Organizations:   . Attends Archivist Meetings:   Marland Kitchen Marital Status:   Intimate Partner Violence:   . Fear of Current or Ex-Partner:   . Emotionally Abused:   Marland Kitchen Physically Abused:   . Sexually Abused:     Review of Systems  Constitutional: Negative for fatigue and unexpected weight change.  HENT: Positive for nosebleeds (tinges when cold. ).   Eyes: Negative for visual disturbance.  Respiratory: Negative for cough, chest tightness and shortness of breath (cold weather. ).   Cardiovascular: Positive for leg swelling (past 2 months. ). Negative for chest pain and palpitations.  Gastrointestinal: Negative for abdominal pain and blood in stool.  Neurological: Positive for dizziness (with chlorthalidone). Negative for light-headedness. Headaches: occasional light headache.      Objective:   Vitals:   09/24/19 0903 09/24/19 0914  BP: (!) 187/91 (!) 178/96  Pulse: 77   Temp: 98.4 F (36.9 C)   TempSrc: Temporal   SpO2: 99%   Weight: 222 lb (100.7 kg)   Height: 5\' 9"  (1.753 m)      Physical Exam Vitals reviewed.  Constitutional:      Appearance: He is well-developed.  HENT:     Head: Normocephalic and atraumatic.     Nose: No nasal deformity or mucosal edema.     Right Nostril: No foreign body or epistaxis.     Left Nostril: No foreign body or epistaxis.     Comments: No active bleeding.  Eyes:     Pupils: Pupils are equal, round, and reactive to light.  Neck:     Vascular: No carotid bruit or JVD.  Cardiovascular:     Rate and Rhythm: Normal rate and regular rhythm.     Heart sounds: Normal heart sounds. No murmur heard.   Pulmonary:     Effort: Pulmonary  effort is normal.     Breath sounds: Normal breath sounds. No rales.  Musculoskeletal:     Right lower leg: Edema (1+ mid tibia bilaterally, no ulcerations.) present.     Left lower leg: Edema present.  Skin:    General: Skin is warm and dry.  Neurological:     Mental Status: He is alert and oriented to person, place, and time.    EKG sinus rhythm, diffuse T wave inversion,  appears similar to EKG 11/30/2018, and at that time thought to be due to hypertensive heart disease without symptoms.  DG Chest 2 View  Result Date: 09/24/2019 CLINICAL DATA:  Dyspnea on exertion.  Hypertension and pedal edema. EXAM: CHEST - 2 VIEW COMPARISON:  None. FINDINGS: The heart size and mediastinal contours are within normal limits. Both lungs are clear. The visualized skeletal structures are unremarkable. IMPRESSION: No active cardiopulmonary disease. Electronically Signed   By: Kerby Moors M.D.   On: 09/24/2019 10:07   Results for orders placed or performed in visit on 09/24/19  POCT urinalysis dipstick  Result Value Ref Range   Color, UA yellow yellow   Clarity, UA clear clear   Glucose, UA negative negative mg/dL   Bilirubin, UA negative negative   Ketones, POC UA negative negative mg/dL   Spec Grav, UA 1.025 1.010 - 1.025   Blood, UA trace-lysed (A) negative   pH, UA 6.0 5.0 - 8.0   Protein Ur, POC =30 (A) negative mg/dL   Urobilinogen, UA 0.2 0.2 or 1.0 E.U./dL   Nitrite, UA Negative Negative   Leukocytes, UA Negative Negative  POCT Microscopic Urinalysis ([facility])  Result Value Ref Range   WBC,UR,HPF,POC Few (A) None WBC/hpf   RBC,UR,HPF,POC None None RBC/hpf   Bacteria None None, Too numerous to count   Mucus Absent Absent   Epithelial Cells, UR Per Microscopy Few (A) None, Too numerous to count cells/hpf  POCT glucose (manual entry)  Result Value Ref Range   POC Glucose 143 (A) 70 - 99 mg/dl    55 min care with chart review, exam, test interpretation. Greater than 50% counseling.    Assessment & Plan:  Martha Soltys is a 54 y.o. male . Hyperglycemia - Plan: Comprehensive metabolic panel, Lipid panel, POCT glucose (manual entry) Screening for diabetes mellitus (DM) - Plan: Hemoglobin A1c  -Slight elevation in office, unlikely cause of urinary symptoms.  Check A1c.  Follow-up next few weeks.  Essential hypertension - Plan: metoprolol tartrate (LOPRESSOR) 25 MG tablet, Comprehensive metabolic panel, Lipid panel, Pro b natriuretic peptide, DG Chest 2 View, EKG 12-Lead  -Uncontrolled.  Medication nonadherence with chlorthalidone due to side effects, and has been off metoprolol.  Restart metoprolol, continue amlodipine.  Will monitor pedal edema as that could be related to amlodipine and may need adjustment.  Special screening for malignant neoplasms, colon - Plan: Ambulatory referral to Gastroenterology  Epistaxis - Plan: Ambulatory referral to ENT, CBC  -Episodic symptoms, primarily in cold weather.  Could be due to dry nasal passages but does report symptoms persisting with any cold exposure including air conditioning.  Refer to ENT for further evaluation.  Nocturia - Plan: PSA, POCT urinalysis dipstick, POCT Microscopic Urinalysis ([facility])  -Differential includes benign prostatic hypertrophy.  No concerning findings on in office urinalysis, unlikely infection, check PSA.  Follow-up in the next 2 weeks, sooner if worse.  Recommend to decrease fluids just before bedtime but to maintain hydration throughout day.  Pedal edema - Plan: Pro b natriuretic peptide DOE (dyspnea on exertion) - Plan: Pro b natriuretic peptide, DG Chest 2 View, EKG 12-Lead, CBC Nonspecific abnormal electrocardiogram (ECG) (EKG) - Plan: Ambulatory referral to Cardiology  -Abnormal EKG but appears similar to previous readings.  Now with some dyspnea on exertion, possibly more cold weather related but with uncontrolled hypertension, previous abnormal EKG Will have him evaluated by cardiology  again.  -Check BNP, but chest x-ray reassuring, no signs of CHF.  -Pedal edema could be related  to amlodipine.  Close monitoring.  ER precautions given  Meds ordered this encounter  Medications  . metoprolol tartrate (LOPRESSOR) 25 MG tablet    Sig: Take 1 tablet (25 mg total) by mouth 2 (two) times daily.    Dispense:  180 tablet    Refill:  1   Patient Instructions   I will check a prostate test and urine test for the nighttime urination.  Try to avoid drinking fluids within an hour or 2 at bedtime.  Make sure to stay hydrated during the day.  Continue amlodipine, restart metoprolol for your blood pressure.  Keep a record of your blood pressure readings for the next visit.  Let me know if any new side effects with that combination.  I will refer you to ear nose and throat doctor for the bleeding in the nose but I do not see any concerns on exam today.  I will refer you to gastroenterology for colonoscopy.  If any worsening shortness of breath, chest pains or other acute worsening symptoms be seen here or the emergency room right away.  I will check some other blood work for the swelling in the legs as well as other symptoms and referred you back to heart doctor.  Recheck in the next 2 weeks.     If you have lab work done today you will be contacted with your lab results within the next 2 weeks.  If you have not heard from [place] then please contact us. The fastest way to get your results is to register for My Chart.   IF you received an x-ray today, you will receive an invoice from [facility] Radiology. Please contact [facility] Radiology at [phone] with questions or concerns regarding your invoice.   IF you received labwork today, you will receive an invoice from [place]. Please contact [facility] at [phone] with questions or concerns regarding your invoice.   Our billing staff will not be able to assist you with questions regarding bills from these companies.  You will be  contacted with the lab results as soon as they are available. The fastest way to get your results is to activate your My Chart account. Instructions are located on the last page of this paperwork. If you have not heard from [place] regarding the results in 2 weeks, please contact this office.         Signed, Merri Ray, MD [facility]

## 2019-09-25 LAB — COMPREHENSIVE METABOLIC PANEL
ALT: 23 IU/L (ref 0–44)
AST: 21 IU/L (ref 0–40)
Albumin/Globulin Ratio: 1.5 (ref 1.2–2.2)
Albumin: 4.8 g/dL (ref 3.8–4.9)
Alkaline Phosphatase: 75 IU/L (ref 48–121)
BUN/Creatinine Ratio: 14 (ref 9–20)
BUN: 17 mg/dL (ref 6–24)
Bilirubin Total: 0.3 mg/dL (ref 0.0–1.2)
CO2: 27 mmol/L (ref 20–29)
Calcium: 10 mg/dL (ref 8.7–10.2)
Chloride: 101 mmol/L (ref 96–106)
Creatinine, Ser: 1.24 mg/dL (ref 0.76–1.27)
GFR calc Af Amer: 75 mL/min/{1.73_m2} (ref >59)
GFR calc non Af Amer: 65 mL/min/{1.73_m2} (ref >59)
Globulin, Total: 3.1 g/dL (ref 1.5–4.5)
Glucose: 138 mg/dL — ABNORMAL HIGH (ref 65–99)
Potassium: 4.2 mmol/L (ref 3.5–5.2)
Sodium: 140 mmol/L (ref 134–144)
Total Protein: 7.9 g/dL (ref 6.0–8.5)

## 2019-09-25 LAB — LIPID PANEL
Chol/HDL Ratio: 2.8 ratio (ref 0.0–5.0)
Cholesterol, Total: 128 mg/dL (ref 100–199)
HDL: 45 mg/dL (ref >39)
LDL Chol Calc (NIH): 71 mg/dL (ref 0–99)
Triglycerides: 56 mg/dL (ref 0–149)
VLDL Cholesterol Cal: 12 mg/dL (ref 5–40)

## 2019-09-25 LAB — CBC
Hematocrit: 41.1 % (ref 37.5–51.0)
Hemoglobin: 13.8 g/dL (ref 13.0–17.7)
MCH: 29.5 pg (ref 26.6–33.0)
MCHC: 33.6 g/dL (ref 31.5–35.7)
MCV: 88 fL (ref 79–97)
Platelets: 275 10*3/uL (ref 150–450)
RBC: 4.68 x10E6/uL (ref 4.14–5.80)
RDW: 12 % (ref 11.6–15.4)
WBC: 5.2 10*3/uL (ref 3.4–10.8)

## 2019-09-25 LAB — HEMOGLOBIN A1C
Est. average glucose Bld gHb Est-mCnc: 117 mg/dL
Hgb A1c MFr Bld: 5.7 % — ABNORMAL HIGH (ref 4.8–5.6)

## 2019-09-25 LAB — PRO B NATRIURETIC PEPTIDE: NT-Pro BNP: 38 pg/mL (ref 0–210)

## 2019-09-25 LAB — PSA: Prostate Specific Ag, Serum: 1.4 ng/mL (ref 0.0–4.0)

## 2019-10-05 ENCOUNTER — Encounter: Payer: Self-pay | Admitting: Radiology

## 2019-10-11 ENCOUNTER — Ambulatory Visit: Payer: BC Managed Care – PPO | Admitting: Family Medicine

## 2019-10-14 ENCOUNTER — Ambulatory Visit: Payer: BC Managed Care – PPO | Admitting: Family Medicine

## 2019-10-18 ENCOUNTER — Encounter: Payer: Self-pay | Admitting: Registered Nurse

## 2019-10-18 ENCOUNTER — Ambulatory Visit (INDEPENDENT_AMBULATORY_CARE_PROVIDER_SITE_OTHER): Payer: BC Managed Care – PPO | Admitting: Registered Nurse

## 2019-10-18 ENCOUNTER — Other Ambulatory Visit: Payer: Self-pay

## 2019-10-18 VITALS — BP 166/90 | HR 56 | Temp 97.6°F | Resp 14 | Ht 69.0 in | Wt 223.0 lb

## 2019-10-18 DIAGNOSIS — I10 Essential (primary) hypertension: Secondary | ICD-10-CM

## 2019-10-18 MED ORDER — LOSARTAN POTASSIUM 50 MG PO TABS: 50.0000 mg | ORAL_TABLET | Freq: Every day | ORAL | 3 refills | Status: DC

## 2019-10-18 NOTE — Progress Notes (Signed)
Established Patient Office Visit  Subjective:  Patient ID: Kevin Jarvis, male    DOB: 14-Jun-1965  Age: 54 y.o. MRN: [id number]  CC:  Chief Complaint  Patient presents with  . Hyperglycemia    pt has been doing okay no complaints, pt has been working on controlling this with diet   . Hypertension    pt has been having consistnetly high readings pt denies physical symptoms, has taken BP at home average 180/90    HPI [name] presents for 2 week follow up on htn  Feeling much better. Taking amlodipine and metoprolol as prescribed Pretibial and dependent edema has almost entirely resolved.  Did not bring home log of bp  BP elevated on arrival at 166/90.  No further concerns  We reviewed his labs - they are reassuring  Has appt with Dr. Einar Gip but it is not for another month.  Past Medical History:  Diagnosis Date  . Anxiety   . Arthritis   . Hypertension     No past surgical history on file.  No family history on file.  Social History   Socioeconomic History  . Marital status: Single    Spouse name: Not on file  . Number of children: Not on file  . Years of education: Not on file  . Highest education level: Not on file  Occupational History  . Not on file  Tobacco Use  . Smoking status: Never Smoker  . Smokeless tobacco: Never Used  Substance and Sexual Activity  . Alcohol use: No  . Drug use: No  . Sexual activity: Not on file  Other Topics Concern  . Not on file  Social History Narrative   From [place] - Moved to [place] 2000   Lives with wife and son   Social Determinants of Health   Financial Resource Strain:   . Difficulty of Paying Living Expenses:   Food Insecurity:   . Worried About Charity fundraiser in the Last Year:   . Arboriculturist in the Last Year:   Transportation Needs:   . Film/video editor (Medical):   Marland Kitchen Lack of Transportation (Non-Medical):   Physical Activity:   . Days of Exercise per Week:   . Minutes of Exercise  per Session:   Stress:   . Feeling of Stress :   Social Connections:   . Frequency of Communication with Friends and Family:   . Frequency of Social Gatherings with Friends and Family:   . Attends Religious Services:   . Active Member of Clubs or Organizations:   . Attends Archivist Meetings:   Marland Kitchen Marital Status:   Intimate Partner Violence:   . Fear of Current or Ex-Partner:   . Emotionally Abused:   Marland Kitchen Physically Abused:   . Sexually Abused:     Outpatient Medications Prior to Visit  Medication Sig Dispense Refill  . amLODipine (NORVASC) 10 MG tablet Take 1 tablet (10 mg total) by mouth daily. 90 tablet 1  . metoprolol tartrate (LOPRESSOR) 25 MG tablet Take 1 tablet (25 mg total) by mouth 2 (two) times daily. 180 tablet 1   No facility-administered medications prior to visit.    Allergies  Allergen Reactions  . Chlorthalidone Other (See Comments)    lightheaded    ROS Review of Systems  Constitutional: Negative.   HENT: Negative.   Eyes: Negative.   Respiratory: Negative.   Cardiovascular: Negative.   Gastrointestinal: Negative.   Endocrine: Negative.   Genitourinary:  Negative.   Musculoskeletal: Negative.   Skin: Negative.   Allergic/Immunologic: Negative.   Neurological: Negative.   Hematological: Negative.   Psychiatric/Behavioral: Negative.   All other systems reviewed and are negative.     Objective:    Physical Exam Constitutional:      Appearance: Normal appearance. He is obese.  Cardiovascular:     Rate and Rhythm: Normal rate and regular rhythm.     Pulses: Normal pulses.     Heart sounds: Normal heart sounds. No murmur heard.  No friction rub. No gallop.   Pulmonary:     Effort: Pulmonary effort is normal. No respiratory distress.     Breath sounds: Normal breath sounds. No stridor. No wheezing, rhonchi or rales.  Chest:     Chest wall: No tenderness.  Neurological:     General: No focal deficit present.     Mental Status: He is  alert and oriented to person, place, and time. Mental status is at baseline.     Cranial Nerves: No cranial nerve deficit.  Psychiatric:        Mood and Affect: Mood normal.        Behavior: Behavior normal.        Thought Content: Thought content normal.        Judgment: Judgment normal.     BP (!) 166/90   Pulse (!) 56   Temp 97.6 F (36.4 C) (Temporal)   Resp 14   Ht 5\' 9"  (1.753 m)   Wt 223 lb (101.2 kg)   SpO2 98%   BMI 32.93 kg/m  Wt Readings from Last 3 Encounters:  10/18/19 223 lb (101.2 kg)  09/24/19 222 lb (100.7 kg)  12/14/18 216 lb (98 kg)     There are no preventive care reminders to display for this patient.  There are no preventive care reminders to display for this patient.  Lab Results  Component Value Date   TSH 2.210 10/24/2016   Lab Results  Component Value Date   WBC 5.2 09/24/2019   HGB 13.8 09/24/2019   HCT 41.1 09/24/2019   MCV 88 09/24/2019   PLT 275 09/24/2019   Lab Results  Component Value Date   NA 140 09/24/2019   K 4.2 09/24/2019   CO2 27 09/24/2019   GLUCOSE 138 (H) 09/24/2019   BUN 17 09/24/2019   CREATININE 1.24 09/24/2019   BILITOT 0.3 09/24/2019   ALKPHOS 75 09/24/2019   AST 21 09/24/2019   ALT 23 09/24/2019   PROT 7.9 09/24/2019   ALBUMIN 4.8 09/24/2019   CALCIUM 10.0 09/24/2019   Lab Results  Component Value Date   CHOL 128 09/24/2019   Lab Results  Component Value Date   HDL 45 09/24/2019   Lab Results  Component Value Date   LDLCALC 71 09/24/2019   Lab Results  Component Value Date   TRIG 56 09/24/2019   Lab Results  Component Value Date   CHOLHDL 2.8 09/24/2019   Lab Results  Component Value Date   HGBA1C 5.7 (H) 09/24/2019      Assessment & Plan:   Problem List Items Addressed This Visit      Cardiovascular and Mediastinum   Hypertension - Primary   Relevant Medications   losartan (COZAAR) 50 MG tablet      Meds ordered this encounter  Medications  . losartan (COZAAR) 50 MG  tablet    Sig: Take 1 tablet (50 mg total) by mouth daily. Increase to twice daily after 1  week.    Dispense:  90 tablet    Refill:  3    Order Specific Question:   Supervising Provider    Answer:   Carlota Raspberry, JEFFREY R [[id number]]    Follow-up: No follow-ups on file.   PLAN  BP down to 156/78 on second reading. Manual reading by provider  Will add losartan 50mg  PO qd, increase to twice daily after one week.  Return in 2 weeks for nurse visit bp check  If elevated will contact patient about next steps  Patient encouraged to call clinic with any questions, comments, or concerns.  Maximiano Coss, NP

## 2019-10-18 NOTE — Patient Instructions (Signed)
° ° ° °  If you have lab work done today you will be contacted with your lab results within the next 2 weeks.  If you have not heard from us then please contact us. The fastest way to get your results is to register for My Chart. ° ° °IF you received an x-ray today, you will receive an invoice from [facility]. Please contact [facility] at [phone] with questions or concerns regarding your invoice.  ° °IF you received labwork today, you will receive an invoice from [facility]. Please contact [facility] at [phone] with questions or concerns regarding your invoice.  ° °Our billing staff will not be able to assist you with questions regarding bills from these companies. ° °You will be contacted with the lab results as soon as they are available. The fastest way to get your results is to activate your My Chart account. Instructions are located on the last page of this paperwork. If you have not heard from us regarding the results in 2 weeks, please contact this office. °  ° ° ° °

## 2019-11-01 ENCOUNTER — Ambulatory Visit: Payer: Self-pay | Admitting: Cardiology

## 2019-11-01 ENCOUNTER — Encounter: Payer: Self-pay | Admitting: Registered Nurse

## 2019-11-01 ENCOUNTER — Ambulatory Visit (INDEPENDENT_AMBULATORY_CARE_PROVIDER_SITE_OTHER): Payer: BC Managed Care – PPO | Admitting: Registered Nurse

## 2019-11-01 ENCOUNTER — Other Ambulatory Visit: Payer: Self-pay

## 2019-11-01 VITALS — BP 193/91 | HR 64 | Temp 97.7°F | Resp 18 | Ht 69.0 in | Wt 223.0 lb

## 2019-11-01 DIAGNOSIS — I10 Essential (primary) hypertension: Secondary | ICD-10-CM

## 2019-11-01 NOTE — Progress Notes (Signed)
Established Patient Office Visit  Subjective:  Patient ID: Kevin Jarvis, male    DOB: Aug 15, 1965  Age: 54 y.o. MRN: [id number]  CC:  Chief Complaint  Patient presents with  . Follow-up    2 week follow up for hypertension. Patient states he has received a additional blood pressure medication but he is not taking it at the moment because its bit changing.    HPI Kevin Jarvis presents for 2 week recheck on his htn  Has not taken his meds today - previously had been consistent No AEs noted. No CV symptoms at this time  Was scheduled to see Dr. Einar Gip on 11/10/19 - unfortunately, his office called and canceled this appt. He does not have another one scheduled. Interested in being seen by a different office.   Past Medical History:  Diagnosis Date  . Anxiety   . Arthritis   . Hypertension     No past surgical history on file.  No family history on file.  Social History   Socioeconomic History  . Marital status: Single    Spouse name: Not on file  . Number of children: Not on file  . Years of education: Not on file  . Highest education level: Not on file  Occupational History  . Not on file  Tobacco Use  . Smoking status: Never Smoker  . Smokeless tobacco: Never Used  Substance and Sexual Activity  . Alcohol use: No  . Drug use: No  . Sexual activity: Not on file  Other Topics Concern  . Not on file  Social History Narrative   From [place] - Moved to [place] 2000   Lives with wife and son   Social Determinants of Health   Financial Resource Strain:   . Difficulty of Paying Living Expenses:   Food Insecurity:   . Worried About Charity fundraiser in the Last Year:   . Arboriculturist in the Last Year:   Transportation Needs:   . Film/video editor (Medical):   Marland Kitchen Lack of Transportation (Non-Medical):   Physical Activity:   . Days of Exercise per Week:   . Minutes of Exercise per Session:   Stress:   . Feeling of Stress :   Social Connections:   .  Frequency of Communication with Friends and Family:   . Frequency of Social Gatherings with Friends and Family:   . Attends Religious Services:   . Active Member of Clubs or Organizations:   . Attends Archivist Meetings:   Marland Kitchen Marital Status:   Intimate Partner Violence:   . Fear of Current or Ex-Partner:   . Emotionally Abused:   Marland Kitchen Physically Abused:   . Sexually Abused:     Outpatient Medications Prior to Visit  Medication Sig Dispense Refill  . amLODipine (NORVASC) 10 MG tablet Take 1 tablet (10 mg total) by mouth daily. 90 tablet 1  . losartan (COZAAR) 50 MG tablet Take 1 tablet (50 mg total) by mouth daily. Increase to twice daily after 1 week. 90 tablet 3  . metoprolol tartrate (LOPRESSOR) 25 MG tablet Take 1 tablet (25 mg total) by mouth 2 (two) times daily. 180 tablet 1   No facility-administered medications prior to visit.    Allergies  Allergen Reactions  . Chlorthalidone Other (See Comments)    lightheaded    ROS Review of Systems Per hpi    Objective:    Physical Exam Vitals and nursing note reviewed.  Constitutional:  General: He is not in acute distress.    Appearance: Normal appearance. He is obese. He is not ill-appearing, toxic-appearing or diaphoretic.  Cardiovascular:     Rate and Rhythm: Normal rate and regular rhythm.     Heart sounds: Normal heart sounds.  Skin:    General: Skin is warm and dry.     Coloration: Skin is not jaundiced or pale.     Findings: No bruising, erythema, lesion or rash.  Neurological:     General: No focal deficit present.     Mental Status: He is alert and oriented to person, place, and time. Mental status is at baseline.  Psychiatric:        Mood and Affect: Mood normal.        Behavior: Behavior normal.        Thought Content: Thought content normal.        Judgment: Judgment normal.     BP (!) 193/91   Pulse 64   Temp 97.7 F (36.5 C) (Temporal)   Resp 18   Ht 5\' 9"  (1.753 m)   Wt (!) 223 lb  (101.2 kg)   SpO2 99%   BMI 32.93 kg/m  Wt Readings from Last 3 Encounters:  11/01/19 (!) 223 lb (101.2 kg)  10/18/19 223 lb (101.2 kg)  09/24/19 222 lb (100.7 kg)     Health Maintenance Due  Topic Date Due  . INFLUENZA VACCINE  10/31/2019    There are no preventive care reminders to display for this patient.  Lab Results  Component Value Date   TSH 2.210 10/24/2016   Lab Results  Component Value Date   WBC 5.2 09/24/2019   HGB 13.8 09/24/2019   HCT 41.1 09/24/2019   MCV 88 09/24/2019   PLT 275 09/24/2019   Lab Results  Component Value Date   NA 140 09/24/2019   K 4.2 09/24/2019   CO2 27 09/24/2019   GLUCOSE 138 (H) 09/24/2019   BUN 17 09/24/2019   CREATININE 1.24 09/24/2019   BILITOT 0.3 09/24/2019   ALKPHOS 75 09/24/2019   AST 21 09/24/2019   ALT 23 09/24/2019   PROT 7.9 09/24/2019   ALBUMIN 4.8 09/24/2019   CALCIUM 10.0 09/24/2019   Lab Results  Component Value Date   CHOL 128 09/24/2019   Lab Results  Component Value Date   HDL 45 09/24/2019   Lab Results  Component Value Date   LDLCALC 71 09/24/2019   Lab Results  Component Value Date   TRIG 56 09/24/2019   Lab Results  Component Value Date   CHOLHDL 2.8 09/24/2019   Lab Results  Component Value Date   HGBA1C 5.7 (H) 09/24/2019      Assessment & Plan:   Problem List Items Addressed This Visit    None    Visit Diagnoses    Resistant hypertension    -  Primary   Relevant Orders   Ambulatory referral to Cardiology      No orders of the defined types were placed in this encounter.   Follow-up: No follow-ups on file.   PLAN  BP continues to be extremely elevated. Likely resistant htn - needs cardiology. Has seen Dr. Einar Gip a number of times before and feels like nothing will change - wll try another office.   Patient encouraged to call clinic with any questions, comments, or concerns.   Maximiano Coss, NP

## 2019-11-01 NOTE — Patient Instructions (Signed)
° ° ° °  If you have lab work done today you will be contacted with your lab results within the next 2 weeks.  If you have not heard from us then please contact us. The fastest way to get your results is to register for My Chart. ° ° °IF you received an x-ray today, you will receive an invoice from [facility]. Please contact [facility] at [phone] with questions or concerns regarding your invoice.  ° °IF you received labwork today, you will receive an invoice from [facility]. Please contact [facility] at [phone] with questions or concerns regarding your invoice.  ° °Our billing staff will not be able to assist you with questions regarding bills from these companies. ° °You will be contacted with the lab results as soon as they are available. The fastest way to get your results is to activate your My Chart account. Instructions are located on the last page of this paperwork. If you have not heard from us regarding the results in 2 weeks, please contact this office. °  ° ° ° °

## 2019-11-10 ENCOUNTER — Ambulatory Visit: Payer: Self-pay | Admitting: Cardiology

## 2019-11-10 ENCOUNTER — Encounter: Payer: Self-pay | Admitting: Gastroenterology

## 2019-12-14 DIAGNOSIS — R040 Epistaxis: Secondary | ICD-10-CM | POA: Diagnosis not present

## 2019-12-14 DIAGNOSIS — J310 Chronic rhinitis: Secondary | ICD-10-CM | POA: Diagnosis not present

## 2019-12-22 NOTE — Progress Notes (Deleted)
Cardiology Office Note:    Date:  12/22/2019   ID:  Kevin Jarvis, DOB [birth year], MRN [id number]  PCP:  Wendie Agreste, MD  Cardiologist:  No primary care provider on file.   Referring MD: Maximiano Coss, NP   No chief complaint on file.   History of Present Illness:    Kevin Jarvis is a 54 y.o. male with a hx of resistant hypertension referred for help with management.   ***  Past Medical History:  Diagnosis Date   Anxiety    Arthritis    Hypertension     No past surgical history on file.  Current Medications: No outpatient medications have been marked as taking for the 12/23/19 encounter (Appointment) with Belva Crome, MD.     Allergies:   Chlorthalidone   Social History   Socioeconomic History   Marital status: Single    Spouse name: Not on file   Number of children: Not on file   Years of education: Not on file   Highest education level: Not on file  Occupational History   Not on file  Tobacco Use   Smoking status: Never Smoker   Smokeless tobacco: Never Used  Substance and Sexual Activity   Alcohol use: No   Drug use: No   Sexual activity: Not on file  Other Topics Concern   Not on file  Social History Narrative   From [place] - Moved to [place] 2000   Lives with wife and son   Social Determinants of Health   Financial Resource Strain:    Difficulty of Paying Living Expenses: Not on file  Food Insecurity:    Worried About Charity fundraiser in the Last Year: Not on file   [employer] of Food in the Last Year: Not on file  Transportation Needs:    Lack of Transportation (Medical): Not on file   Lack of Transportation (Non-Medical): Not on file  Physical Activity:    Days of Exercise per Week: Not on file   Minutes of Exercise per Session: Not on file  Stress:    Feeling of Stress : Not on file  Social Connections:    Frequency of Communication with Friends and Family: Not on file   Frequency of Social Gatherings  with Friends and Family: Not on file   Attends Religious Services: Not on file   Active Member of Clubs or Organizations: Not on file   Attends Archivist Meetings: Not on file   Marital Status: Not on file     Family History: The patient's family history is not on file.  ROS:   Please see the history of present illness.    *** All other systems reviewed and are negative.  EKGs/Labs/Other Studies Reviewed:    The following studies were reviewed today:   Echocardiogram 2018: Moderate LVH with normal systolic function.  ***   EKG:  EKG on 09/24/2019, NSR with LVH and strain.  Recent Labs: 09/24/2019: ALT 23; BUN 17; Creatinine, Ser 1.24; Hemoglobin 13.8; NT-Pro BNP 38; Platelets 275; Potassium 4.2; Sodium 140  Recent Lipid Panel    Component Value Date/Time   CHOL 128 09/24/2019 1009   TRIG 56 09/24/2019 1009   HDL 45 09/24/2019 1009   CHOLHDL 2.8 09/24/2019 1009   CHOLHDL 3.2 04/03/2015 0936   VLDL 16 04/03/2015 0936   LDLCALC 71 09/24/2019 1009    Physical Exam:    VS:  There were no vitals taken for this visit.  Wt Readings from Last 3 Encounters:  11/01/19 (!) 223 lb (101.2 kg)  10/18/19 223 lb (101.2 kg)  09/24/19 222 lb (100.7 kg)     GEN: ***. No acute distress HEENT: Normal NECK: No JVD. LYMPHATICS: No lymphadenopathy CARDIAC: *** RRR without murmur, gallop, or edema. VASCULAR: *** Normal Pulses. No bruits. RESPIRATORY:  Clear to auscultation without rales, wheezing or rhonchi  ABDOMEN: Soft, non-tender, non-distended, No pulsatile mass, MUSCULOSKELETAL: No deformity  SKIN: Warm and dry NEUROLOGIC:  Alert and oriented x 3 PSYCHIATRIC:  Normal affect   ASSESSMENT:    1. Hypertension, unspecified type   2. LVH (left ventricular hypertrophy)   3. Long term use of drug   4. Educated about COVID-19 virus infection    PLAN:    In order of problems listed above:  1. ***   Medication Adjustments/Labs and Tests Ordered: Current  medicines are reviewed at length with the patient today.  Concerns regarding medicines are outlined above.  No orders of the defined types were placed in this encounter.  No orders of the defined types were placed in this encounter.   There are no Patient Instructions on file for this visit.   Signed, Sinclair Grooms, MD  12/22/2019 5:43 PM    [place]

## 2019-12-23 ENCOUNTER — Ambulatory Visit: Payer: Self-pay | Admitting: Interventional Cardiology

## 2019-12-29 ENCOUNTER — Telehealth: Payer: Self-pay | Admitting: *Deleted

## 2019-12-29 NOTE — Telephone Encounter (Signed)
Call patient, no answer. Left a message.

## 2019-12-29 NOTE — Telephone Encounter (Signed)
Patient no show pre-visit appointment today. Called patient, no answer, left a message for him to call us back to reschedule pv before 5 pm today.

## 2020-01-05 ENCOUNTER — Encounter: Payer: Self-pay | Admitting: General Practice

## 2020-01-12 ENCOUNTER — Encounter: Payer: Medicaid Other | Admitting: Gastroenterology

## 2020-02-01 NOTE — Progress Notes (Signed)
Cardiology Office Note:   Date:  02/02/2020  NAME:  Kevin Jarvis    MRN: [id number] DOB:  12-12-1965   PCP:  Wendie Agreste, MD  Cardiologist:  No primary care provider on file.   Referring MD: Maximiano Coss, NP   Chief Complaint  Patient presents with  . Abnormal ECG   History of Present Illness:   Kevin Jarvis is a 54 y.o. male with a hx of hypertension, arthritis who is being seen today for the evaluation of abnormal EKG at the request of Wendie Agreste, MD.  He reports he had high blood pressure for years.  His EKG is abnormal.  It shows LVH with repolarization abnormality.  Need to exclude hypertrophic cardiomyopathy.  He is not diabetic.  Lipid profile shows his LDL is marginal at 71.  He describes no symptoms from this.  Apparently he was prescribed metoprolol 25 twice a day and losartan 50 mg twice a day.  He was also prescribed amlodipine 10 mg daily.  He is only taking his metoprolol and losartan once a day.  He has been tested for sleep apnea in the past.  No history of this.  He reports he is a never smoker.  He does not drink alcohol or use drugs.  He reports that he mainly eats at home but does eat at restaurants and fast food at times.  He was tested for sleep apnea in the past and reportedly this was negative.  He denies any chest pain or shortness of breath.  He works as a Glass blower/designer.  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  Apparently has no issues doing this.  Thyroid studies from several years ago were normal.  He describes no excessive fatigue.  No daytime sleepiness.  He has no symptoms of chest pain, shortness of breath or palpitations.  Blood pressure severely elevated 170 today.  Needs more aggressive medication management.  Problem List 1. HTN -Total cholesterol 128, HDL 45, LDL 71, triglycerides 56, A1c 5.7 2.  Obesity -BMI 33  Past Medical History: Past Medical History:  Diagnosis Date  . Anxiety   . Arthritis    . Hypertension     Past Surgical History: History reviewed. No pertinent surgical history.  Current Medications: Current Meds  Medication Sig  . amLODipine (NORVASC) 10 MG tablet Take 1 tablet (10 mg total) by mouth daily.  . [DISCONTINUED] losartan (COZAAR) 50 MG tablet Take 1 tablet (50 mg total) by mouth daily.  . [DISCONTINUED] metoprolol tartrate (LOPRESSOR) 25 MG tablet Take 1 tablet (25 mg total) by mouth daily.     Allergies:    Chlorthalidone   Social History: Social History   Socioeconomic History  . Marital status: Married    Spouse name: Not on file  . Number of children: 2  . Years of education: Not on file  . Highest education level: Not on file  Occupational History  . Occupation: Glass blower/designer  Tobacco Use  . Smoking status: Never Smoker  . Smokeless tobacco: Never Used  Substance and Sexual Activity  . Alcohol use: Yes  . Drug use: No  . Sexual activity: Not on file  Other Topics Concern  . Not on file  Social History Narrative   From [place] - Moved to [place] 2000   Lives with wife and son   Social Determinants of Health   Financial Resource Strain:   . Difficulty of Paying Living Expenses: Not on  file  Food Insecurity:   . Worried About Charity fundraiser in the Last Year: Not on file  . Ran Out of Food in the Last Year: Not on file  Transportation Needs:   . Lack of Transportation (Medical): Not on file  . Lack of Transportation (Non-Medical): Not on file  Physical Activity:   . Days of Exercise per Week: Not on file  . Minutes of Exercise per Session: Not on file  Stress:   . Feeling of Stress : Not on file  Social Connections:   . Frequency of Communication with Friends and Family: Not on file  . Frequency of Social Gatherings with Friends and Family: Not on file  . Attends Religious Services: Not on file  . Active Member of Clubs or Organizations: Not on file  . Attends Archivist Meetings: Not on file  . Marital  Status: Not on file     Family History: The patient's family history is not on file.  ROS:   All other ROS reviewed and negative. Pertinent positives noted in the HPI.     EKGs/Labs/Other Studies Reviewed:   The following studies were personally reviewed by me today:  EKG:  EKG is ordered today.  The ekg ordered today demonstrates NSR HR 60, LVH with repolarization, and was personally reviewed by me.   Recent Labs: 09/24/2019: ALT 23; BUN 17; Creatinine, Ser 1.24; Hemoglobin 13.8; NT-Pro BNP 38; Platelets 275; Potassium 4.2; Sodium 140   Recent Lipid Panel    Component Value Date/Time   CHOL 128 09/24/2019 1009   TRIG 56 09/24/2019 1009   HDL 45 09/24/2019 1009   CHOLHDL 2.8 09/24/2019 1009   CHOLHDL 3.2 04/03/2015 0936   VLDL 16 04/03/2015 0936   LDLCALC 71 09/24/2019 1009    Physical Exam:   VS:  BP (!) 170/84   Pulse 60   Ht 5\' 9"  (1.753 m)   Wt 223 lb (101.2 kg)   BMI 32.93 kg/m    Wt Readings from Last 3 Encounters:  02/02/20 223 lb (101.2 kg)  11/01/19 (!) 223 lb (101.2 kg)  10/18/19 223 lb (101.2 kg)    General: Well nourished, well developed, in no acute distress Heart: Atraumatic, normal size  Eyes: PEERLA, EOMI  Neck: Supple, no JVD Endocrine: No thryomegaly Cardiac: Normal S1, S2; RRR; faint 2 out of 6 systolic ejection murmur Lungs: Clear to auscultation bilaterally, no wheezing, rhonchi or rales  Abd: Soft, nontender, no hepatomegaly  Ext: No edema, pulses 2+ Musculoskeletal: No deformities, BUE and BLE strength normal and equal Skin: Warm and dry, no rashes   Neuro: Alert and oriented to person, place, time, and situation, CNII-XII grossly intact, no focal deficits  Psych: Normal mood and affect   ASSESSMENT:   Kevin Jarvis is a 54 y.o. male who presents for the following: 1. Nonspecific abnormal electrocardiogram (ECG) (EKG)   2. Primary hypertension   3. Obesity (BMI 30-39.9)     PLAN:   1. Nonspecific abnormal electrocardiogram (ECG)  (EKG) 2. Primary hypertension 3. Obesity (BMI 30-39.9) -Blood pressure severely elevated today.  EKG shows LVH with severe repolarization abnormality.  He does have a faint 2 out of 6 systolic ejection murmur.  He needs an echocardiogram to evaluate this. -No concerns for sleep apnea.  Thyroid studies were normal recently.  We will recheck a TSH today. -No symptoms of heart failure.  No symptoms from his blood pressure. -He is only taking amlodipine 10 mg a day  and losartan 50 mg a day.  He was taking metoprolol 25 mg only once a day.  He reports he cannot take medications twice a day.  I have recommended to increase his losartan to 100 mg a day.  He is on minimal therapy.  Blood pressure is still very elevated.  We need to get this under control.  I will see him back in 6 weeks for further titration. -No secondary cause that I can tell.  He is screen negative for sleep apnea in the past.  No excessive fatigue or sleepiness reported.  Disposition: Return in about 6 weeks (around 03/15/2020).  Medication Adjustments/Labs and Tests Ordered: Current medicines are reviewed at length with the patient today.  Concerns regarding medicines are outlined above.  Orders Placed This Encounter  Procedures  . EKG 12-Lead  . ECHOCARDIOGRAM COMPLETE   Meds ordered this encounter  Medications  . losartan (COZAAR) 100 MG tablet    Sig: Take 1 tablet (100 mg total) by mouth daily.    Dispense:  90 tablet    Refill:  3    There are no Patient Instructions on file for this visit.    Signed, Addison Naegeli. Audie Box, [place]  [address] [place], [place] [address] [phone]  02/02/2020 9:08 AM

## 2020-02-02 ENCOUNTER — Encounter: Payer: Self-pay | Admitting: Cardiovascular Disease

## 2020-02-02 ENCOUNTER — Ambulatory Visit (INDEPENDENT_AMBULATORY_CARE_PROVIDER_SITE_OTHER): Payer: Self-pay | Admitting: Cardiovascular Disease

## 2020-02-02 ENCOUNTER — Other Ambulatory Visit: Payer: Self-pay

## 2020-02-02 VITALS — BP 170/84 | HR 60 | Ht 69.0 in | Wt 223.0 lb

## 2020-02-02 DIAGNOSIS — I10 Essential (primary) hypertension: Secondary | ICD-10-CM

## 2020-02-02 DIAGNOSIS — R9431 Abnormal electrocardiogram [ECG] [EKG]: Secondary | ICD-10-CM | POA: Diagnosis not present

## 2020-02-02 DIAGNOSIS — E669 Obesity, unspecified: Secondary | ICD-10-CM

## 2020-02-02 LAB — TSH: TSH: 1.37 u[IU]/mL (ref 0.450–4.500)

## 2020-02-02 MED ORDER — LOSARTAN POTASSIUM 100 MG PO TABS: 100.0000 mg | ORAL_TABLET | Freq: Every day | ORAL | 3 refills | Status: DC

## 2020-02-02 NOTE — Patient Instructions (Signed)
Medication Instructions:  Stop Metoprolol Increase Losartan to 100 mg daily Continue all other medications *If you need a refill on your cardiac medications before your next appointment, please call your pharmacy*   Lab Work: Tsh today   Testing/Procedures: Echo   Follow-Up: At [facility], you and your health needs are our priority.  As part of our continuing mission to provide you with exceptional heart care, we have created designated Provider Care Teams.  These Care Teams include your primary Cardiologist (physician) and Advanced Practice Providers (APPs -  Physician Assistants and Nurse Practitioners) who all work together to provide you with the care you need, when you need it.  We recommend signing up for the patient portal called "MyChart".  Sign up information is provided on this After Visit Summary.  MyChart is used to connect with patients for Virtual Visits (Telemedicine).  Patients are able to view lab/test results, encounter notes, upcoming appointments, etc.  Non-urgent messages can be sent to your provider as well.   To learn more about what you can do with MyChart, go to [email or web address].    Your next appointment:  6 weeks   The format for your next appointment: Office   Provider:  Dr.O'Neal   Check blood pressure daily keep a log Low salt diet follow [place] Guidelines

## 2020-03-01 ENCOUNTER — Other Ambulatory Visit (HOSPITAL_COMMUNITY): Payer: BC Managed Care – PPO

## 2020-03-05 NOTE — Progress Notes (Signed)
Cardiology Office Note:   Date:  03/06/2020  NAME:  Kevin Jarvis    MRN: [id number] DOB:  05-Jun-1965   PCP:  Wendie Agreste, MD  Cardiologist:  No primary care provider on file.   Referring MD: Wendie Agreste, MD   Chief Complaint  Patient presents with  . Hypertension   History of Present Illness:   Kevin Jarvis is a 54 y.o. male with a hx of HTN, LVH, obesity who presents for follow-up. Seen for abnormal EKG. Echo not done yet. Losartan increased to 100 mg daily and on amlodipine 10 mg QD.  Blood pressure 170/96.  He does present with a log over the past few days.  Blood pressure has not been controlled.  The lowest value was 165/91.  He did not take his medications today.  He is only taking amlodipine and losartan.  Seems this is not doing the job.  He works for a [employer] movement [employer].  He does a lot of walking every day but has no chest pain or shortness of breath.  There is no structured exercise.  He is working on salt reduction strategies.  He does not eat out.  His wife cooks all the meals.  They are working on this at home.  He is not diabetic with an A1c of 5.7.  Most recent LDL cholesterol 71.  Problem List 1. HTN -Total cholesterol 128, HDL 45, LDL 71, triglycerides 56, A1c 5.7 2.  Obesity -BMI 33  Past Medical History: Past Medical History:  Diagnosis Date  . Anxiety   . Arthritis   . Hypertension     Past Surgical History: History reviewed. No pertinent surgical history.  Current Medications: Current Meds  Medication Sig  . amLODipine (NORVASC) 10 MG tablet Take 1 tablet (10 mg total) by mouth daily.  Marland Kitchen losartan (COZAAR) 100 MG tablet Take 1 tablet (100 mg total) by mouth daily.     Allergies:    Chlorthalidone   Social History: Social History   Socioeconomic History  . Marital status: Married    Spouse name: Not on file  . Number of children: 2  . Years of education: Not on file  . Highest education level: Not on file    Occupational History  . Occupation: Glass blower/designer  Tobacco Use  . Smoking status: Never Smoker  . Smokeless tobacco: Never Used  Substance and Sexual Activity  . Alcohol use: Yes  . Drug use: No  . Sexual activity: Not on file  Other Topics Concern  . Not on file  Social History Narrative   From [place] - Moved to [place] 2000   Lives with wife and son   Social Determinants of Health   Financial Resource Strain:   . Difficulty of Paying Living Expenses: Not on file  Food Insecurity:   . Worried About Charity fundraiser in the Last Year: Not on file  . Ran Out of Food in the Last Year: Not on file  Transportation Needs:   . Lack of Transportation (Medical): Not on file  . Lack of Transportation (Non-Medical): Not on file  Physical Activity:   . Days of Exercise per Week: Not on file  . Minutes of Exercise per Session: Not on file  Stress:   . Feeling of Stress : Not on file  Social Connections:   . Frequency of Communication with Friends and Family: Not on file  . Frequency of Social Gatherings with Friends and Family: Not on file  .  Attends Religious Services: Not on file  . Active Member of Clubs or Organizations: Not on file  . Attends Archivist Meetings: Not on file  . Marital Status: Not on file     Family History: The patient's family history is not on file.  ROS:   All other ROS reviewed and negative. Pertinent positives noted in the HPI.     EKGs/Labs/Other Studies Reviewed:   The following studies were personally reviewed by me today:  Recent Labs: 09/24/2019: ALT 23; BUN 17; Creatinine, Ser 1.24; Hemoglobin 13.8; NT-Pro BNP 38; Platelets 275; Potassium 4.2; Sodium 140 02/02/2020: TSH 1.370   Recent Lipid Panel    Component Value Date/Time   CHOL 128 09/24/2019 1009   TRIG 56 09/24/2019 1009   HDL 45 09/24/2019 1009   CHOLHDL 2.8 09/24/2019 1009   CHOLHDL 3.2 04/03/2015 0936   VLDL 16 04/03/2015 0936   LDLCALC 71 09/24/2019 1009     Physical Exam:   VS:  BP (!) 178/96   Pulse 81   Ht 5\' 9"  (1.753 m)   Wt 223 lb 6.4 oz (101.3 kg)   SpO2 98%   BMI 32.99 kg/m    Wt Readings from Last 3 Encounters:  03/06/20 223 lb 6.4 oz (101.3 kg)  02/02/20 223 lb (101.2 kg)  11/01/19 (!) 223 lb (101.2 kg)    General: Well nourished, well developed, in no acute distress Heart: Atraumatic, normal size  Eyes: PEERLA, EOMI  Neck: Supple, no JVD Endocrine: No thryomegaly Cardiac: Normal S1, S2; RRR; no murmurs, rubs, or gallops Lungs: Clear to auscultation bilaterally, no wheezing, rhonchi or rales  Abd: Soft, nontender, no hepatomegaly  Ext: No edema, pulses 2+ Musculoskeletal: No deformities, BUE and BLE strength normal and equal Skin: Warm and dry, no rashes   Neuro: Alert and oriented to person, place, time, and situation, CNII-XII grossly intact, no focal deficits  Psych: Normal mood and affect   ASSESSMENT:   Kevin Jarvis is a 54 y.o. male who presents for the following: 1. Nonspecific abnormal electrocardiogram (ECG) (EKG)   2. Primary hypertension   3. Obesity (BMI 30-39.9)     PLAN:   1. Nonspecific abnormal electrocardiogram (ECG) (EKG) 2. Primary hypertension 3. Obesity (BMI 30-39.9) -Blood pressure not at goal today.  We will continue amlodipine 10 mg a day and losartan 100 mg a day.  We will add HCTZ 25 mg a day and Aldactone 25 mg a day.  Counseled on salt reduction strategies.  He also needs start exercising.  He will do this.  He does have an echocardiogram that will be done for his abnormal EKG.  He has LVH.  He will complete this and start a blood pressure log.  He will see me back in 3 months.  He does need to lose some weight.  Sleep apnea is an issue.  Hopefully blood pressure will get under better control with addition of additional medications.  Disposition: Return in about 3 months (around 06/04/2020).  Medication Adjustments/Labs and Tests Ordered: Current medicines are reviewed at length with  the patient today.  Concerns regarding medicines are outlined above.  No orders of the defined types were placed in this encounter.  Meds ordered this encounter  Medications  . hydrochlorothiazide (HYDRODIURIL) 25 MG tablet    Sig: Take 1 tablet (25 mg total) by mouth daily.    Dispense:  90 tablet    Refill:  3  . spironolactone (ALDACTONE) 25 MG tablet  Sig: Take 1 tablet (25 mg total) by mouth daily.    Dispense:  90 tablet    Refill:  3    Patient Instructions  Medication Instructions:  Start HCTZ 25 mg daily Start Aldactone 25 mg daily Continue all other medications *If you need a refill on your cardiac medications before your next appointment, please call your pharmacy*   Lab Work: None ordered   Testing/Procedures: None ordered   Follow-Up: At [facility], you and your health needs are our priority.  As part of our continuing mission to provide you with exceptional heart care, we have created designated Provider Care Teams.  These Care Teams include your primary Cardiologist (physician) and Advanced Practice Providers (APPs -  Physician Assistants and Nurse Practitioners) who all work together to provide you with the care you need, when you need it.  We recommend signing up for the patient portal called "MyChart".  Sign up information is provided on this After Visit Summary.  MyChart is used to connect with patients for Virtual Visits (Telemedicine).  Patients are able to view lab/test results, encounter notes, upcoming appointments, etc.  Non-urgent messages can be sent to your provider as well.   To learn more about what you can do with MyChart, go to [email or web address].    Your next appointment:  3 months   The format for your next appointment: Office   Provider:  Dr.O'Neal    Check blood pressure daily.Keep a log. Bring log back at next visit.    Time Spent with Patient: I have spent a total of 25 minutes with patient reviewing hospital notes,  telemetry, EKGs, labs and examining the patient as well as establishing an assessment and plan that was discussed with the patient.  > 50% of time was spent in direct patient care.  Signed, Addison Naegeli. Audie Box, [place]  [address] [place], [place] [address] [phone]  03/06/2020 9:42 AM

## 2020-03-06 ENCOUNTER — Encounter: Payer: Self-pay | Admitting: Cardiovascular Disease

## 2020-03-06 ENCOUNTER — Other Ambulatory Visit: Payer: Self-pay

## 2020-03-06 ENCOUNTER — Ambulatory Visit (INDEPENDENT_AMBULATORY_CARE_PROVIDER_SITE_OTHER): Payer: BC Managed Care – PPO | Admitting: Cardiovascular Disease

## 2020-03-06 VITALS — BP 178/96 | HR 81 | Ht 69.0 in | Wt 223.4 lb

## 2020-03-06 DIAGNOSIS — R9431 Abnormal electrocardiogram [ECG] [EKG]: Secondary | ICD-10-CM | POA: Diagnosis not present

## 2020-03-06 DIAGNOSIS — E669 Obesity, unspecified: Secondary | ICD-10-CM | POA: Diagnosis not present

## 2020-03-06 DIAGNOSIS — I10 Essential (primary) hypertension: Secondary | ICD-10-CM | POA: Diagnosis not present

## 2020-03-06 MED ORDER — HYDROCHLOROTHIAZIDE 25 MG PO TABS: 25.0000 mg | ORAL_TABLET | Freq: Every day | ORAL | 3 refills | Status: DC

## 2020-03-06 MED ORDER — SPIRONOLACTONE 25 MG PO TABS: 25.0000 mg | ORAL_TABLET | Freq: Every day | ORAL | 3 refills | Status: DC

## 2020-03-06 NOTE — Patient Instructions (Addendum)
Medication Instructions:  Start HCTZ 25 mg daily Start Aldactone 25 mg daily Continue all other medications *If you need a refill on your cardiac medications before your next appointment, please call your pharmacy*   Lab Work: None ordered   Testing/Procedures: None ordered   Follow-Up: At [facility], you and your health needs are our priority.  As part of our continuing mission to provide you with exceptional heart care, we have created designated Provider Care Teams.  These Care Teams include your primary Cardiologist (physician) and Advanced Practice Providers (APPs -  Physician Assistants and Nurse Practitioners) who all work together to provide you with the care you need, when you need it.  We recommend signing up for the patient portal called "MyChart".  Sign up information is provided on this After Visit Summary.  MyChart is used to connect with patients for Virtual Visits (Telemedicine).  Patients are able to view lab/test results, encounter notes, upcoming appointments, etc.  Non-urgent messages can be sent to your provider as well.   To learn more about what you can do with MyChart, go to [email or web address].    Your next appointment:  3 months   The format for your next appointment: Office   Provider:  Dr.O'Neal    Check blood pressure daily.Keep a log. Bring log back at next visit.

## 2020-03-23 ENCOUNTER — Ambulatory Visit (HOSPITAL_COMMUNITY): Payer: BC Managed Care – PPO | Attending: Internal Medicine

## 2020-03-23 ENCOUNTER — Other Ambulatory Visit: Payer: Self-pay

## 2020-03-23 ENCOUNTER — Ambulatory Visit (HOSPITAL_COMMUNITY): Payer: BC Managed Care – PPO | Attending: Cardiology

## 2020-03-23 DIAGNOSIS — I10 Essential (primary) hypertension: Secondary | ICD-10-CM | POA: Diagnosis not present

## 2020-03-23 DIAGNOSIS — R9431 Abnormal electrocardiogram [ECG] [EKG]: Secondary | ICD-10-CM | POA: Diagnosis not present

## 2020-03-23 DIAGNOSIS — E669 Obesity, unspecified: Secondary | ICD-10-CM | POA: Insufficient documentation

## 2020-03-23 LAB — ECHOCARDIOGRAM COMPLETE
Area-P 1/2: 2.87 cm2
S' Lateral: 2.75 cm

## 2020-03-28 ENCOUNTER — Telehealth: Payer: Self-pay

## 2020-03-28 NOTE — Telephone Encounter (Signed)
    I went in pt's chart to see who called him. It was Darene Lamer

## 2020-04-12 ENCOUNTER — Other Ambulatory Visit: Payer: Self-pay | Admitting: Family Medicine

## 2020-04-12 DIAGNOSIS — I10 Essential (primary) hypertension: Secondary | ICD-10-CM

## 2020-06-06 ENCOUNTER — Ambulatory Visit: Payer: BC Managed Care – PPO | Admitting: Cardiovascular Disease

## 2020-06-15 NOTE — Progress Notes (Signed)
Cardiology Office Note:   Date:  06/16/2020  NAME:  Kevin Jarvis    MRN: [id number] DOB:  22-Jan-1966   PCP:  Wendie Agreste, MD  Cardiologist:  No primary care provider on file.  Electrophysiologist:  None   Referring MD: Wendie Agreste, MD   Chief Complaint  Patient presents with  . Hypertension   History of Present Illness:   Kevin Jarvis is a 55 y.o. male with a hx of HTN, obesity who presents for follow-up. Aldactone added at last visit.  He presents again without taking his blood pressure medication this morning.  BP 174/92.  We did add Aldactone at his last visit.  He is on Norvasc 10 mg daily, HCTZ 25 mg daily, losartan 100 mg daily and Aldactone 25 mg daily.  He presents with a blood pressure log.  BPs are not controlled.  Values are persistently above 170 mmHg.  He denies any symptoms.  He screens negative for sleep apnea.  He reports he does not snore.  He is not excessively fatigued or tired.  He has lost 11 pounds and his blood pressure still elevated.  I hear no bruits on examination.  I do not feel this is secondary to hypertension.  He is working on salt reduction strategies.  We discussed evaluation by Dr. Oval Linsey in her hypertension clinic.  They may have better options for him.  I suspect overall there are likely issues with compliance.  He is coming to the office every time without taking his medications by me telling him to.   Problem List 1. HTN -Total cholesterol 128, HDL 45, LDL 71, triglycerides 56, A1c 5.7 2.Obesity -BMI 33  Past Medical History: Past Medical History:  Diagnosis Date  . Anxiety   . Arthritis   . Hypertension     Past Surgical History: History reviewed. No pertinent surgical history.  Current Medications: Current Meds  Medication Sig  . amLODipine (NORVASC) 10 MG tablet Take 1 tablet by mouth once daily  . hydrochlorothiazide (HYDRODIURIL) 25 MG tablet Take 1 tablet (25 mg total) by mouth daily.  Marland Kitchen losartan (COZAAR) 100 MG  tablet Take 1 tablet (100 mg total) by mouth daily.  Marland Kitchen spironolactone (ALDACTONE) 25 MG tablet Take 1 tablet (25 mg total) by mouth daily.     Allergies:    Chlorthalidone   Social History: Social History   Socioeconomic History  . Marital status: Married    Spouse name: Not on file  . Number of children: 2  . Years of education: Not on file  . Highest education level: Not on file  Occupational History  . Occupation: Glass blower/designer  Tobacco Use  . Smoking status: Never Smoker  . Smokeless tobacco: Never Used  Substance and Sexual Activity  . Alcohol use: Yes  . Drug use: No  . Sexual activity: Not on file  Other Topics Concern  . Not on file  Social History Narrative   From [place] - Moved to [place] 2000   Lives with wife and son   Social Determinants of Health   Financial Resource Strain: Not on file  Food Insecurity: Not on file  Transportation Needs: Not on file  Physical Activity: Not on file  Stress: Not on file  Social Connections: Not on file    Family History: The patient's family history is not on file.  ROS:   All other ROS reviewed and negative. Pertinent positives noted in the HPI.     EKGs/Labs/Other Studies Reviewed:  The following studies were personally reviewed by me today:  TTE 03/23/2020 1. Left ventricular ejection fraction, by estimation, is 60 to 65%. The  left ventricle has normal function. The left ventricle has no regional  wall motion abnormalities. There is moderate left ventricular hypertrophy.  Left ventricular diastolic  parameters are consistent with Grade I diastolic dysfunction (impaired  relaxation). Elevated left ventricular end-diastolic pressure. The average  left ventricular global longitudinal strain is 19.9 %. The global  longitudinal strain is normal.  2. Right ventricular systolic function is normal. The right ventricular  size is normal.  3. The mitral valve is abnormal. Mild mitral valve regurgitation. There   is mild late systolic prolapse of the middle scallop of the posterior  leaflet of the mitral valve.  4. The aortic valve is tricuspid. Aortic valve regurgitation is not  visualized.  5. The inferior vena cava is normal in size with greater than 50%  respiratory variability, suggesting right atrial pressure of 3 mmHg.   Recent Labs: 09/24/2019: ALT 23; BUN 17; Creatinine, Ser 1.24; Hemoglobin 13.8; NT-Pro BNP 38; Platelets 275; Potassium 4.2; Sodium 140 02/02/2020: TSH 1.370   Recent Lipid Panel    Component Value Date/Time   CHOL 128 09/24/2019 1009   TRIG 56 09/24/2019 1009   HDL 45 09/24/2019 1009   CHOLHDL 2.8 09/24/2019 1009   CHOLHDL 3.2 04/03/2015 0936   VLDL 16 04/03/2015 0936   LDLCALC 71 09/24/2019 1009    Physical Exam:   VS:  BP (!) 174/92 (BP Location: Left Arm, Patient Position: Sitting)   Pulse 83   Ht 5\' 9"  (1.753 m)   Wt 212 lb 9.6 oz (96.4 kg)   BMI 31.40 kg/m    Wt Readings from Last 3 Encounters:  06/16/20 212 lb 9.6 oz (96.4 kg)  03/06/20 223 lb 6.4 oz (101.3 kg)  02/02/20 223 lb (101.2 kg)    General: Well nourished, well developed, in no acute distress Head: Atraumatic, normal size  Eyes: PEERLA, EOMI  Neck: Supple, no JVD Endocrine: No thryomegaly Cardiac: Normal S1, S2; RRR; no murmurs, rubs, or gallops Lungs: Clear to auscultation bilaterally, no wheezing, rhonchi or rales  Abd: Soft, nontender, no hepatomegaly  Ext: No edema, pulses 2+ Musculoskeletal: No deformities, BUE and BLE strength normal and equal Skin: Warm and dry, no rashes   Neuro: Alert and oriented to person, place, time, and situation, CNII-XII grossly intact, no focal deficits  Psych: Normal mood and affect   ASSESSMENT:   Kevin Jarvis is a 55 y.o. male who presents for the following: 1. Nonspecific abnormal electrocardiogram (ECG) (EKG)   2. Primary hypertension   3. Obesity (BMI 30-39.9)     PLAN:   1. Nonspecific abnormal electrocardiogram (ECG) (EKG) 2.  Primary hypertension 3. Obesity (BMI 30-39.9) -He presents again with uncontrolled hypertension.  BP 174/92.  He tells me he did not take his medications.  He is on amlodipine 10 mg daily, HCTZ 25 mg daily, losartan 10 milligrams daily, Aldactone 25 mg daily.  He has lost 11 pounds and BPs are still elevated.  His log shows values are very elevated as well.  I hear no bruits.  He is having no spikes in blood pressure.  I do not feel this is secondary hypertension.  I feel this is primary.  He screens negative for sleep apnea.  Overall I think he needs better education as well as counseling about taking his medications.  I recommended he see Dr. Skeet Latch in  her hypertension clinic.  I do not feel he needs a work-up for secondary causes but she may feel differently.  We will defer to her expertise.  His cholesterol level is acceptable.  Likely should be on a statin but will hold for now.  He will see me as needed.  I think it is imperative to get his blood pressure under control given his moderate LVH seen on his recent echocardiogram.  Disposition: Return if symptoms worsen or fail to improve.  Medication Adjustments/Labs and Tests Ordered: Current medicines are reviewed at length with the patient today.  Concerns regarding medicines are outlined above.  Orders Placed This Encounter  Procedures  . AMB REFERRAL TO ADVANCED HTN CLINIC   No orders of the defined types were placed in this encounter.   Patient Instructions  Medication Instructions:  The current medical regimen is effective;  continue present plan and medications.  *If you need a refill on your cardiac medications before your next appointment, please call your pharmacy*  Follow-Up: At [facility], you and your health needs are our priority.  As part of our continuing mission to provide you with exceptional heart care, we have created designated Provider Care Teams.  These Care Teams include your primary Cardiologist  (physician) and Advanced Practice Providers (APPs -  Physician Assistants and Nurse Practitioners) who all work together to provide you with the care you need, when you need it.  We recommend signing up for the patient portal called "MyChart".  Sign up information is provided on this After Visit Summary.  MyChart is used to connect with patients for Virtual Visits (Telemedicine).  Patients are able to view lab/test results, encounter notes, upcoming appointments, etc.  Non-urgent messages can be sent to your provider as well.   To learn more about what you can do with MyChart, go to [email or web address].    Your next appointment:   They will call you to get you scheduled  Provider:   Skeet Latch, MD        Time Spent with Patient: I have spent a total of 25 minutes with patient reviewing hospital notes, telemetry, EKGs, labs and examining the patient as well as establishing an assessment and plan that was discussed with the patient.  > 50% of time was spent in direct patient care.  Signed, Addison Naegeli. Audie Box, MD, [place]  [address], [place], [place] [address] [phone]  06/16/2020 9:40 AM

## 2020-06-16 ENCOUNTER — Encounter: Payer: Self-pay | Admitting: Cardiovascular Disease

## 2020-06-16 ENCOUNTER — Ambulatory Visit (INDEPENDENT_AMBULATORY_CARE_PROVIDER_SITE_OTHER): Payer: BC Managed Care – PPO | Admitting: Cardiovascular Disease

## 2020-06-16 ENCOUNTER — Other Ambulatory Visit: Payer: Self-pay

## 2020-06-16 VITALS — BP 174/92 | HR 83 | Ht 69.0 in | Wt 212.6 lb

## 2020-06-16 DIAGNOSIS — E669 Obesity, unspecified: Secondary | ICD-10-CM | POA: Diagnosis not present

## 2020-06-16 DIAGNOSIS — I10 Essential (primary) hypertension: Secondary | ICD-10-CM

## 2020-06-16 DIAGNOSIS — R9431 Abnormal electrocardiogram [ECG] [EKG]: Secondary | ICD-10-CM | POA: Diagnosis not present

## 2020-06-16 NOTE — Patient Instructions (Signed)
Medication Instructions:  The current medical regimen is effective;  continue present plan and medications.  *If you need a refill on your cardiac medications before your next appointment, please call your pharmacy*  Follow-Up: At [facility], you and your health needs are our priority.  As part of our continuing mission to provide you with exceptional heart care, we have created designated Provider Care Teams.  These Care Teams include your primary Cardiologist (physician) and Advanced Practice Providers (APPs -  Physician Assistants and Nurse Practitioners) who all work together to provide you with the care you need, when you need it.  We recommend signing up for the patient portal called "MyChart".  Sign up information is provided on this After Visit Summary.  MyChart is used to connect with patients for Virtual Visits (Telemedicine).  Patients are able to view lab/test results, encounter notes, upcoming appointments, etc.  Non-urgent messages can be sent to your provider as well.   To learn more about what you can do with MyChart, go to [email or web address].    Your next appointment:   They will call you to get you scheduled  Provider:   Skeet Latch, MD

## 2020-08-02 ENCOUNTER — Other Ambulatory Visit: Payer: Self-pay | Admitting: Family Medicine

## 2020-08-02 ENCOUNTER — Other Ambulatory Visit: Payer: Self-pay

## 2020-08-02 DIAGNOSIS — I10 Essential (primary) hypertension: Secondary | ICD-10-CM

## 2020-08-21 NOTE — Progress Notes (Signed)
Advanced Hypertension Clinic Initial Assessment:    Date:  08/22/2020   ID:  Kevin Jarvis, DOB 09-20-1965, MRN [id number]  PCP:  Wendie Agreste, MD  Cardiologist:  None  Nephrologist:  Referring MD: Geralynn Rile, *   CC: Hypertension  History of Present Illness:    Kevin Jarvis is a 55 y.o. male with a hx of anxiety, arthritis, hypertension, and obesity here to establish care in the hypertension clinic 08/22/2020. He was last seen on 06/16/2020 by Dr. Audie Box. His blood pressure remains uncontrolled and is persistently above 170 mmHg. He is on Norvasc 10 mg daily, HCTZ 25 mg daily, losartan 100 mg daily and Aldactone 25 mg daily.  He denies any symptoms. Dr. Audie Box suspects issues with compliance. He was prescribed HCTZ but stopped taking it because his wife was concerned about potential side effects.  Today, he states he has had hypertension for at least the past 4 years. His blood pressure has always been high. After lunch around 2 pm he will take all of his daily medication. If he does miss a dose he is able to feel when his blood pressure is high, and he develops a headache. He occasionally exercises formally. When walking or exercising he has no exertional symptoms. 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 Normally his wife cooks at home and does not add salt to the meals. Typically they avoid canned foods, and he does not drink coffee. He will have sweet tea sometimes, and alcohol only rarely. He has tried multiple supplements and herbs if he hears they can lower his hypertension. For pain he usually takes tylenol or naproxen, but not a large amount. He has never been a smoker. He denies any chest pain, shortness of breath, or palpitations. No lightheadedness, or syncope to report. Also has no lower extremity edema, orthopnea or PND. In his family, he is the only one with hypertension. He has one brother and two sisters with no known cardiovascular issues.    Previous antihypertensives: HCTZ- stopped 2/2 wife's concern for potential side effects   Past Medical History:  Diagnosis Date  . Anxiety   . Arthritis   . Hypertension     History reviewed. No pertinent surgical history.  Current Medications: Current Meds  Medication Sig  . amLODipine (NORVASC) 10 MG tablet Take 1 tablet by mouth once daily  . chlorthalidone (HYGROTON) 25 MG tablet Take 1 tablet (25 mg total) by mouth daily.  Marland Kitchen losartan (COZAAR) 100 MG tablet Take 1 tablet (100 mg total) by mouth daily.  Marland Kitchen spironolactone (ALDACTONE) 25 MG tablet Take 1 tablet (25 mg total) by mouth daily.     Allergies:   Chlorthalidone   Social History   Socioeconomic History  . Marital status: Married    Spouse name: Not on file  . Number of children: 2  . Years of education: Not on file  . Highest education level: Not on file  Occupational History  . Occupation: Glass blower/designer  Tobacco Use  . Smoking status: Never Smoker  . Smokeless tobacco: Never Used  Substance and Sexual Activity  . Alcohol use: Yes  . Drug use: No  . Sexual activity: Not on file  Other Topics Concern  . Not on file  Social History Narrative   From [place] - Moved to [place] 2000   Lives with wife and son   Social Determinants of Health   Financial Resource Strain: Not on file  Food Insecurity: Not  on file  Transportation Needs: Not on file  Physical Activity: Not on file  Stress: Not on file  Social Connections: Not on file     Family History: The patient's family history is not on file.  ROS:   Please see the history of present illness.    (+) Headache All other systems reviewed and are negative.  EKGs/Labs/Other Studies Reviewed:    EKG:   08/22/2020: NSR, rate 85 bpm, Lateral T wave abnormalities  Recent Labs: 09/24/2019: ALT 23; BUN 17; Creatinine, Ser 1.24; Hemoglobin 13.8; NT-Pro BNP 38; Platelets 275; Potassium 4.2; Sodium 140 02/02/2020: TSH 1.370   Recent Lipid Panel     Component Value Date/Time   CHOL 128 09/24/2019 1009   TRIG 56 09/24/2019 1009   HDL 45 09/24/2019 1009   CHOLHDL 2.8 09/24/2019 1009   CHOLHDL 3.2 04/03/2015 0936   VLDL 16 04/03/2015 0936   LDLCALC 71 09/24/2019 1009   Echo 03/23/2020: 1. Left ventricular ejection fraction, by estimation, is 60 to 65%. The  left ventricle has normal function. The left ventricle has no regional  wall motion abnormalities. There is moderate left ventricular hypertrophy.  Left ventricular diastolic  parameters are consistent with Grade I diastolic dysfunction (impaired  relaxation). Elevated left ventricular end-diastolic pressure. The average  left ventricular global longitudinal strain is 19.9 %. The global  longitudinal strain is normal.  2. Right ventricular systolic function is normal. The right ventricular  size is normal.  3. The mitral valve is abnormal. Mild mitral valve regurgitation. There  is mild late systolic prolapse of the middle scallop of the posterior  leaflet of the mitral valve.  4. The aortic valve is tricuspid. Aortic valve regurgitation is not  visualized.  5. The inferior vena cava is normal in size with greater than 50%  respiratory variability, suggesting right atrial pressure of 3 mmHg.   Comparison(s): Prior images unable to be directly viewed, comparison made  by report only. Changes from prior study are noted. 01/02/2017: LVEF 66%,  moderate LVH, grade 2 DD.    Physical Exam:   VS:  BP (!) 172/94 (BP Location: Right Arm, Patient Position: Sitting)   Pulse 85   Ht 5\' 9"  (1.753 m)   Wt 215 lb 9.6 oz (97.8 kg)   SpO2 99%   BMI 31.84 kg/m  , BMI Body mass index is 31.84 kg/m. GENERAL:  Well appearing HEENT: Pupils equal round and reactive, fundi not visualized, oral mucosa unremarkable NECK:  No jugular venous distention, waveform within normal limits, carotid upstroke brisk and symmetric, no bruits, no thyromegaly LYMPHATICS:  No cervical  adenopathy LUNGS:  Clear to auscultation bilaterally HEART:  RRR.  PMI not displaced or sustained,S1 and S2 within normal limits, no S3, no S4, no clicks, no rubs, no murmurs ABD:  Flat, positive bowel sounds normal in frequency in pitch, no bruits, no rebound, no guarding, no midline pulsatile mass, no hepatomegaly, no splenomegaly EXT:  2 plus pulses throughout, no edema, no cyanosis no clubbing SKIN:  No rashes no nodules NEURO:  Cranial nerves II through XII grossly intact, motor grossly intact throughout PSYCH:  Cognitively intact, oriented to person place and time   ASSESSMENT/PLAN:    Hypertension Blood pressure is poorly controlled on more than 3 agents 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  He has not been taking his hydrochlorothiazide due to concern for potential side effects.  He does not actually have any side effects.  We will start him on chlorthalidone  25 mg daily and check a basic metabolic panel in a week.  Ideally we would check for renin and and aldosterone levels as well.  However he is on losartan and spironolactone.  With his current blood pressure levels I do not feel comfortable holding those medications at this time.  Consider in the future.  We discussed increasing his exercise to 150 minutes and he will be contacted by the PREP program through the [facility].  He also consents to monitoring through the [facility] remote patient monitoring program.  He will check his blood pressures twice a day and follow-up with our pharmacist in 2 months.   Screening for Secondary Hypertension:  Causes 08/22/2020  Drugs/Herbals Screened     - Comments Discussed avoiding NSAIDs.  Use Tylenol only.  Renovascular HTN Screened     - Comments Check renal artery Dopplers  Sleep Apnea Screened     - Comments No symptoms  Thyroid Disease Screened  Hyperaldosteronism Not Screened     - Comments Check renin and aldosterone at a later time.  Blood pressure remains elevated on spironolactone  making this less likely.  Pheochromocytoma N/A  Cushing's Syndrome N/A  Hyperparathyroidism N/A  Coarctation of the Aorta Screened     - Comments Blood pressures are different in each arm by 8 mmHg.  Continue to monitor.  Compliance Screened     - Comments Reports that he is taking all his medicine except his hydrochlorothiazide.  He does forget to take them at times.  He feels worse when he does not take his medicine.    Relevant Labs/Studies: Basic Labs Latest Ref Rng & Units 09/24/2019 11/30/2018 11/16/2018  Sodium 134 - 144 mmol/L 140 138 141  Potassium 3.5 - 5.2 mmol/L 4.2 3.6 4.2  Creatinine 0.76 - 1.27 mg/dL 1.24 1.07 1.10    Thyroid  Latest Ref Rng & Units 02/02/2020 10/24/2016  TSH 0.450 - 4.500 uIU/mL 1.370 2.210             Renovascular  08/22/2020  Renal Artery [place] Completed Yes      Disposition:    FU with MD in 2 months.   Medication Adjustments/Labs and Tests Ordered: Current medicines are reviewed at length with the patient today.  Concerns regarding medicines are outlined above.  Orders Placed This Encounter  Procedures  . Basic metabolic panel  . VAS US RENAL ARTERY DUPLEX   Meds ordered this encounter  Medications  . chlorthalidone (HYGROTON) 25 MG tablet    Sig: Take 1 tablet (25 mg total) by mouth daily.    Dispense:  90 tablet    Refill:  3    D/C HCTZ    I,Mathew Stumpf,acting as a scribe for Skeet Latch, MD.,have documented all relevant documentation on the behalf of Skeet Latch, MD,as directed by  Skeet Latch, MD while in the presence of Skeet Latch, MD.  I, [place] Oval Linsey, MD have reviewed all documentation for this visit.  The documentation of the exam, diagnosis, procedures, and orders on 08/22/2020 are all accurate and complete.   Signed, Skeet Latch, MD  08/22/2020 12:41 PM    [facility]

## 2020-08-22 ENCOUNTER — Other Ambulatory Visit: Payer: Self-pay

## 2020-08-22 ENCOUNTER — Encounter: Payer: Self-pay | Admitting: Cardiovascular Disease

## 2020-08-22 ENCOUNTER — Ambulatory Visit (INDEPENDENT_AMBULATORY_CARE_PROVIDER_SITE_OTHER): Payer: BC Managed Care – PPO | Admitting: Cardiovascular Disease

## 2020-08-22 VITALS — BP 172/94 | HR 85 | Ht 69.0 in | Wt 215.6 lb

## 2020-08-22 DIAGNOSIS — I10 Essential (primary) hypertension: Secondary | ICD-10-CM | POA: Diagnosis not present

## 2020-08-22 DIAGNOSIS — Z5181 Encounter for therapeutic drug level monitoring: Secondary | ICD-10-CM | POA: Diagnosis not present

## 2020-08-22 MED ORDER — CHLORTHALIDONE 25 MG PO TABS: 25.0000 mg | ORAL_TABLET | Freq: Every day | ORAL | 3 refills | Status: DC

## 2020-08-22 NOTE — Patient Instructions (Addendum)
Medication Instructions:  STOP HYDROCHLOROTHIAZIDE  START CHLORTHALIDONE 25 MG DAILY     Labwork: BMET IN 1 WEEK    Testing/Procedures: Your physician has requested that you have a renal artery duplex. During this test, an ultrasound is used to evaluate blood flow to the kidneys. Allow one hour for this exam. Do not eat after midnight the day before and avoid carbonated beverages. Take your medications as you usually do.   Follow-Up: 10/25/2020 AT 9:30 AM WITH PHARM D   You will receive a phone call from the PREP exercise and nutrition program to schedule an initial assessment.   Special Instructions:   MONITOR YOUR BLOOD PRESSURE TWICE A DAY AT HOME WITH MONITOR PROVIDED MAKE SURE YOU ARE LOGGED INTO YOUR VIVIFY APP WHEN CHECKING BLOOD PRESSURE   DASH Eating Plan DASH stands for "Dietary Approaches to Stop Hypertension." The DASH eating plan is a healthy eating plan that has been shown to reduce high blood pressure (hypertension). It may also reduce your risk for type 2 diabetes, heart disease, and stroke. The DASH eating plan may also help with weight loss. What are tips for following this plan?  General guidelines  Avoid eating more than 2,300 mg (milligrams) of salt (sodium) a day. If you have hypertension, you may need to reduce your sodium intake to 1,500 mg a day.  Limit alcohol intake to no more than 1 drink a day for nonpregnant women and 2 drinks a day for men. One drink equals 12 oz of beer, 5 oz of wine, or 1 oz of hard liquor.  Work with your health care provider to maintain a healthy body weight or to lose weight. Ask what an ideal weight is for you.  Get at least 30 minutes of exercise that causes your heart to beat faster (aerobic exercise) most days of the week. Activities may include walking, swimming, or biking.  Work with your health care provider or diet and nutrition specialist (dietitian) to adjust your eating plan to your individual calorie needs. Reading  food labels   Check food labels for the amount of sodium per serving. Choose foods with less than 5 percent of the Daily Value of sodium. Generally, foods with less than 300 mg of sodium per serving fit into this eating plan.  To find whole grains, look for the word "whole" as the first word in the ingredient list. Shopping  Buy products labeled as "low-sodium" or "no salt added."  Buy fresh foods. Avoid canned foods and premade or frozen meals. Cooking  Avoid adding salt when cooking. Use salt-free seasonings or herbs instead of table salt or sea salt. Check with your health care provider or pharmacist before using salt substitutes.  Do not fry foods. Cook foods using healthy methods such as baking, boiling, grilling, and broiling instead.  Cook with heart-healthy oils, such as olive, canola, soybean, or sunflower oil. Meal planning  Eat a balanced diet that includes: ? 5 or more servings of fruits and vegetables each day. At each meal, try to fill half of your plate with fruits and vegetables. ? Up to 6-8 servings of whole grains each day. ? Less than 6 oz of lean meat, poultry, or fish each day. A 3-oz serving of meat is about the same size as a deck of cards. One egg equals 1 oz. ? 2 servings of low-fat dairy each day. ? A serving of nuts, seeds, or beans 5 times each week. ? Heart-healthy fats. Healthy fats called Omega-3 fatty  acids are found in foods such as flaxseeds and coldwater fish, like sardines, salmon, and mackerel.  Limit how much you eat of the following: ? Canned or prepackaged foods. ? Food that is high in trans fat, such as fried foods. ? Food that is high in saturated fat, such as fatty meat. ? Sweets, desserts, sugary drinks, and other foods with added sugar. ? Full-fat dairy products.  Do not salt foods before eating.  Try to eat at least 2 vegetarian meals each week.  Eat more home-cooked food and less restaurant, buffet, and fast food.  When eating at  a restaurant, ask that your food be prepared with less salt or no salt, if possible. What foods are recommended? The items listed may not be a complete list. Talk with your dietitian about what dietary choices are best for you. Grains Whole-grain or whole-wheat bread. Whole-grain or whole-wheat pasta. Brown rice. [place]ena Morrow. Bulgur. Whole-grain and low-sodium cereals. Pita bread. Low-fat, low-sodium crackers. Whole-wheat flour tortillas. Vegetables Fresh or frozen vegetables (raw, steamed, roasted, or grilled). Low-sodium or reduced-sodium tomato and vegetable juice. Low-sodium or reduced-sodium tomato sauce and tomato paste. Low-sodium or reduced-sodium canned vegetables. Fruits All fresh, dried, or frozen fruit. Canned fruit in natural juice (without added sugar). Meat and other protein foods Skinless chicken or [place]. Ground chicken or [place]. Pork with fat trimmed off. Fish and seafood. Egg whites. Dried beans, peas, or lentils. Unsalted nuts, nut butters, and seeds. Unsalted canned beans. Lean cuts of beef with fat trimmed off. Low-sodium, lean deli meat. Dairy Low-fat (1%) or fat-free (skim) milk. Fat-free, low-fat, or reduced-fat cheeses. Nonfat, low-sodium ricotta or cottage cheese. Low-fat or nonfat yogurt. Low-fat, low-sodium cheese. Fats and oils Soft margarine without trans fats. Vegetable oil. Low-fat, reduced-fat, or light mayonnaise and salad dressings (reduced-sodium). Canola, safflower, olive, soybean, and sunflower oils. Avocado. Seasoning and other foods Herbs. Spices. Seasoning mixes without salt. Unsalted popcorn and pretzels. Fat-free sweets. What foods are not recommended? The items listed may not be a complete list. Talk with your dietitian about what dietary choices are best for you. Grains Baked goods made with fat, such as croissants, muffins, or some breads. Dry pasta or rice meal packs. Vegetables Creamed or fried vegetables. Vegetables in a cheese sauce.  Regular canned vegetables (not low-sodium or reduced-sodium). Regular canned tomato sauce and paste (not low-sodium or reduced-sodium). Regular tomato and vegetable juice (not low-sodium or reduced-sodium). Angie Fava. Olives. Fruits Canned fruit in a light or heavy syrup. Fried fruit. Fruit in cream or butter sauce. Meat and other protein foods Fatty cuts of meat. Ribs. Fried meat. Berniece Salines. Sausage. Bologna and other processed lunch meats. Salami. Fatback. Hotdogs. Bratwurst. Salted nuts and seeds. Canned beans with added salt. Canned or smoked fish. Whole eggs or egg yolks. Chicken or [place] with skin. Dairy Whole or 2% milk, cream, and half-and-half. Whole or full-fat cream cheese. Whole-fat or sweetened yogurt. Full-fat cheese. Nondairy creamers. Whipped toppings. Processed cheese and cheese spreads. Fats and oils Butter. Stick margarine. Lard. Shortening. Ghee. Bacon fat. Tropical oils, such as coconut, palm kernel, or palm oil. Seasoning and other foods Salted popcorn and pretzels. Onion salt, garlic salt, seasoned salt, table salt, and sea salt. Worcestershire sauce. Tartar sauce. Barbecue sauce. Teriyaki sauce. Soy sauce, including reduced-sodium. Steak sauce. Canned and packaged gravies. Fish sauce. Oyster sauce. Cocktail sauce. Horseradish that you find on the shelf. Ketchup. Mustard. Meat flavorings and tenderizers. Bouillon cubes. Hot sauce and Tabasco sauce. Premade or packaged marinades. Premade or packaged  taco seasonings. Relishes. Regular salad dressings. Where to find more information:  National Heart, Lung, and [place]: https://wilson-eaton.com/  American Heart Association: www.heart.org Summary  The DASH eating plan is a healthy eating plan that has been shown to reduce high blood pressure (hypertension). It may also reduce your risk for type 2 diabetes, heart disease, and stroke.  With the DASH eating plan, you should limit salt (sodium) intake to 2,300 mg a day. If you have  hypertension, you may need to reduce your sodium intake to 1,500 mg a day.  When on the DASH eating plan, aim to eat more fresh fruits and vegetables, whole grains, lean proteins, low-fat dairy, and heart-healthy fats.  Work with your health care provider or diet and nutrition specialist (dietitian) to adjust your eating plan to your individual calorie needs. This information is not intended to replace advice given to you by your health care provider. Make sure you discuss any questions you have with your health care provider. Document Released: 03/07/2011 Document Revised: 02/28/2017 Document Reviewed: 03/11/2016 Elsevier Patient Education  2020 [employer].

## 2020-08-22 NOTE — Assessment & Plan Note (Addendum)
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  He has not been taking his hydrochlorothiazide due to concern for potential side effects.  He does not actually have any side effects.  We will start him on chlorthalidone 25 mg daily and check a basic metabolic panel in a week.  Ideally we would check for renin and and aldosterone levels as well.  However he is on losartan and spironolactone.  With his current blood pressure levels I do not feel comfortable holding those medications at this time.  Consider in the future.  We discussed increasing his exercise to 150 minutes and he will be contacted by the PREP program through the [facility].  He also consents to monitoring through the [place] remote patient monitoring program.  He will check his blood pressures twice a day and follow-up with our pharmacist in 2 months.

## 2020-08-23 NOTE — Addendum Note (Signed)
Addended by: Wonda Horner on: 08/23/2020 04:21 PM   Modules accepted: Orders

## 2020-08-24 ENCOUNTER — Telehealth: Payer: Self-pay

## 2020-08-26 DIAGNOSIS — I10 Essential (primary) hypertension: Secondary | ICD-10-CM | POA: Diagnosis not present

## 2020-08-26 NOTE — Telephone Encounter (Signed)
Left voicemail re: interest in PREP program at [facility]

## 2020-08-29 ENCOUNTER — Telehealth: Payer: Self-pay

## 2020-08-29 NOTE — Telephone Encounter (Signed)
-----   Message from Deberah Pelton, NP sent at 08/29/2020  1:31 PM EDT ----- Sharyn Lull please contact patient to see if they are taking medication as perscribed and/or having trouble with their medications. His last vivify BP were elevated 160-170/ 100's. He has afollow up appt with Dr. Oval Linsey in a week.   Thanks  Denyse Amass

## 2020-08-29 NOTE — Telephone Encounter (Signed)
LM2CB-Called pt back to discuss BP and BMET on 5-31.

## 2020-08-31 ENCOUNTER — Telehealth: Payer: Self-pay

## 2020-08-31 NOTE — Telephone Encounter (Signed)
Please contact Mr. Mcquillen and ask him what questions he has about his BP medications. Thanks, Denyse Amass

## 2020-09-01 NOTE — Telephone Encounter (Signed)
Calling back pt to see if he could do PREP class starting 09/19/20 at 1pm on Tues/Thurs He requested to call me back to discuss.

## 2020-09-01 NOTE — Telephone Encounter (Signed)
LM2CB-2nd ATTEMPT

## 2020-09-04 ENCOUNTER — Telehealth: Payer: Self-pay

## 2020-09-04 DIAGNOSIS — Z0000 Encounter for general adult medical examination without abnormal findings: Secondary | ICD-10-CM

## 2020-09-04 NOTE — Telephone Encounter (Signed)
Called patient to determine if he is doing okay and if he has taken his medications as prescribed. Patient stated that he takes he medication during his lunch break and check blood pressure in car immediately after work. Patient stated that he was feeling okay.   Patient was informed that the provider will be notified about the elevation in his blood pressure and that he would be called when his readings are abnormal. Patient understands next steps.

## 2020-09-05 ENCOUNTER — Other Ambulatory Visit: Payer: Self-pay

## 2020-09-05 ENCOUNTER — Ambulatory Visit (HOSPITAL_COMMUNITY)
Admission: RE | Admit: 2020-09-05 | Discharge: 2020-09-05 | Disposition: A | Discharge status: Home / Self Care | Payer: BC Managed Care – PPO | Source: Ambulatory Visit | Attending: Internal Medicine | Admitting: Internal Medicine

## 2020-09-05 DIAGNOSIS — I10 Essential (primary) hypertension: Secondary | ICD-10-CM | POA: Diagnosis not present

## 2020-09-07 ENCOUNTER — Telehealth: Payer: Self-pay

## 2020-09-07 DIAGNOSIS — Z79899 Other long term (current) drug therapy: Secondary | ICD-10-CM

## 2020-09-07 DIAGNOSIS — I10 Essential (primary) hypertension: Secondary | ICD-10-CM

## 2020-09-07 MED ORDER — VALSARTAN 160 MG PO TABS: 160.0000 mg | ORAL_TABLET | Freq: Every day | ORAL | 3 refills | Status: DC

## 2020-09-07 NOTE — Telephone Encounter (Signed)
-----   Message from Deberah Pelton, NP sent at 09/07/2020  2:41 PM EDT ----- Regarding: RE: Vivify - elevated readings Sharyn Lull,  Please reach out to this patient and see if they have started valsartan or have any questions about medication.  Thank you.  Jossie Ng. Cleaver NP-C  09/07/2020, 2:41 PM [place] [address] [phone] Fax [phone]   ----- Message ----- From: Avelino Leeds Sent: 09/07/2020   2:30 PM EDT To: Deberah Pelton, NP, Waylan Rocher, LPN Subject: RE: Kevin Jarvis - elevated readings                 Hi Sharyn Lull,   Have you followed up with this patient yet? His reading for today was 180/119.  Best, Amy ----- Message ----- From: Deberah Pelton, NP Sent: 09/04/2020   5:55 PM EDT To: Waylan Rocher, LPN, Amy Lee Subject: RE: Kevin Jarvis - elevated readings                 Hi Majesti Gambrell please contact Kevin Jarvis and have him stop his losartan.  We will start him on valsartan 160 mg daily.  Please order a BMP in 1 week.  Thank you.  Denyse Amass  ----- Message ----- From: Avelino Leeds Sent: 09/04/2020   2:32 PM EDT To: Deberah Pelton, NP, Waylan Rocher, LPN Subject: Vivify - elevated readings                     Hi team,  I just wanted to let you know that the patient's readings over the weekend are still elevated. I called him to ensure he is doing okay and if he is taking his medications as prescribed. Patient stated that he takes he medication during his lunch break and check blood pressure in car immediately after work. Patient stated that he was feeling okay. Highest reading was 161/108, lowest reading 150/100, and today's reading was 159/95.  Thanks, Amy

## 2020-09-07 NOTE — Telephone Encounter (Signed)
Tried to call pt x2 and phone rings once or not at all then goes to VM. LM2CB-ASAP.

## 2020-09-07 NOTE — Telephone Encounter (Signed)
New rx sent to pharmacy. Left detailed message to stop losartan and start valsartan. Gann and s/w Joe he states that he will mention to pt to stop losartan and start valsartan, he will make sure not to mention this to the pt when he picks up the new rx.

## 2020-09-08 NOTE — Telephone Encounter (Signed)
Pt notified he will start medication tomorrow. Pt states that his BP was elevated yesterday because he had a mishap at work and that made his heart race and nervous and elevated his BP.

## 2020-09-09 ENCOUNTER — Other Ambulatory Visit: Payer: Self-pay | Admitting: Family Medicine

## 2020-09-09 DIAGNOSIS — I10 Essential (primary) hypertension: Secondary | ICD-10-CM

## 2020-09-14 ENCOUNTER — Telehealth: Payer: Self-pay | Admitting: Cardiovascular Disease

## 2020-09-14 NOTE — Telephone Encounter (Signed)
Spoke to patient renal doppler results given.Advised to repeat in 1 year.

## 2020-09-14 NOTE — Telephone Encounter (Signed)
Folllow Up:      Pt is calling Melinda back from yesterday, concerning his  test results.

## 2020-09-21 ENCOUNTER — Telehealth: Payer: Self-pay | Admitting: General Practice

## 2020-09-21 DIAGNOSIS — I10 Essential (primary) hypertension: Secondary | ICD-10-CM

## 2020-09-21 DIAGNOSIS — Z5181 Encounter for therapeutic drug level monitoring: Secondary | ICD-10-CM

## 2020-09-21 DIAGNOSIS — Z79899 Other long term (current) drug therapy: Secondary | ICD-10-CM

## 2020-09-21 NOTE — Telephone Encounter (Signed)
Remote blood pressure monitoring indicates that his blood pressures continue to be elevated.  160s over 100s.  Please reach out to patient, review medications, review diet, and give opportunity to ask questions.  Thank you.

## 2020-09-22 MED ORDER — VALSARTAN 320 MG PO TABS: 320.0000 mg | ORAL_TABLET | Freq: Every day | ORAL | 6 refills | Status: DC

## 2020-09-22 NOTE — Addendum Note (Signed)
Addended by: Waylan Rocher on: 09/22/2020 11:35 AM   Modules accepted: Orders

## 2020-09-22 NOTE — Telephone Encounter (Signed)
Pt called back he states tha he is taking ALL of his medications at 2pm daily then he takes his BP at 11am and it has been running still in the 160's/100's. What should we do now?

## 2020-09-22 NOTE — Telephone Encounter (Signed)
Deberah Pelton, NP  Waylan Rocher, LPN We will increase his valsartan to 320 mg daily.  He will need a BMP in 1 week. Please have him keep his follow-up appointment.  Thank you.   Pt notified, verbalized understanding. He will start increased medication today and will return 09-28-20 for labs.

## 2020-09-28 DIAGNOSIS — I10 Essential (primary) hypertension: Secondary | ICD-10-CM | POA: Diagnosis not present

## 2020-09-28 DIAGNOSIS — Z79899 Other long term (current) drug therapy: Secondary | ICD-10-CM | POA: Diagnosis not present

## 2020-09-28 DIAGNOSIS — Z5181 Encounter for therapeutic drug level monitoring: Secondary | ICD-10-CM | POA: Diagnosis not present

## 2020-09-29 LAB — BASIC METABOLIC PANEL
BUN/Creatinine Ratio: 15 (ref 9–20)
BUN: 20 mg/dL (ref 6–24)
CO2: 26 mmol/L (ref 20–29)
Calcium: 10.4 mg/dL — ABNORMAL HIGH (ref 8.7–10.2)
Chloride: 88 mmol/L — ABNORMAL LOW (ref 96–106)
Creatinine, Ser: 1.36 mg/dL — ABNORMAL HIGH (ref 0.76–1.27)
Glucose: 596 mg/dL (ref 65–99)
Potassium: 4.8 mmol/L (ref 3.5–5.2)
Sodium: 130 mmol/L — ABNORMAL LOW (ref 134–144)
eGFR: 61 mL/min/{1.73_m2} (ref >59)

## 2020-10-03 ENCOUNTER — Telehealth: Payer: Self-pay

## 2020-10-03 DIAGNOSIS — Z0000 Encounter for general adult medical examination without abnormal findings: Secondary | ICD-10-CM

## 2020-10-03 NOTE — Telephone Encounter (Signed)
Called patient to determine if he was having any connectivity issues. Patient stated that he did not check his blood pressure over the weekend because he was out of town. Patient is aware that BP should be checked daily. Patient had some concerns regarding how to take his medications. Sent a message to [employer] and Veronda Prude regarding patient's concern and his elevated readings from the past week for further follow up.

## 2020-10-04 ENCOUNTER — Telehealth: Payer: Self-pay

## 2020-10-04 NOTE — Telephone Encounter (Signed)
-----   Message from Deberah Pelton, NP sent at 10/03/2020  9:45 AM EDT ----- Regarding: RE: Vivify - Elevated BP readings and medication questions  Sharyn Lull,  Please contact this patient to review his medications, and answer any medication questions, and review his current symptoms.  Thank you,  Jossie Ng. Cleaver NP-C 10/03/2020, 9:46 AM [place] [address] Office [phone] Fax [phone]    ----- Message ----- From: Avelino Leeds Sent: 10/03/2020   9:13 AM EDT To: Deberah Pelton, NP, Waylan Rocher, LPN Subject: Vivify - Elevated BP readings and medication#  Hi [name],  I spoke with this patient to ensure that he was not having connectivity issues since we have no reads on him since 6/30. Patient was out of town at the time and did not check his BP. Patient is aware that he needs to check BP daily. Patient had questions regarding how to take his medications. Patient is complaining about extreme thirst, urination, and heartburn.  Elevated readings:  6/29 - 152/100 and 172/106 6/30 165/107

## 2020-10-04 NOTE — Telephone Encounter (Signed)
LM2CB 

## 2020-10-06 ENCOUNTER — Other Ambulatory Visit: Payer: Self-pay

## 2020-10-06 ENCOUNTER — Ambulatory Visit (INDEPENDENT_AMBULATORY_CARE_PROVIDER_SITE_OTHER): Payer: BC Managed Care – PPO | Admitting: Family Medicine

## 2020-10-06 VITALS — BP 138/76 | HR 75 | Temp 98.2°F | Resp 15 | Ht 69.0 in | Wt 197.8 lb

## 2020-10-06 DIAGNOSIS — E119 Type 2 diabetes mellitus without complications: Secondary | ICD-10-CM | POA: Diagnosis not present

## 2020-10-06 DIAGNOSIS — R739 Hyperglycemia, unspecified: Secondary | ICD-10-CM

## 2020-10-06 DIAGNOSIS — I10 Essential (primary) hypertension: Secondary | ICD-10-CM

## 2020-10-06 DIAGNOSIS — R7989 Other specified abnormal findings of blood chemistry: Secondary | ICD-10-CM

## 2020-10-06 LAB — BASIC METABOLIC PANEL
BUN: 21 mg/dL (ref 6–23)
CO2: 28 mEq/L (ref 19–32)
Calcium: 10.6 mg/dL — ABNORMAL HIGH (ref 8.4–10.5)
Chloride: 92 mEq/L — ABNORMAL LOW (ref 96–112)
Creatinine, Ser: 1.29 mg/dL (ref 0.40–1.50)
GFR: 62.05 mL/min (ref >60.00)
Glucose, Bld: 452 mg/dL — ABNORMAL HIGH (ref 70–99)
Potassium: 3.8 mEq/L (ref 3.5–5.1)
Sodium: 131 mEq/L — ABNORMAL LOW (ref 135–145)

## 2020-10-06 LAB — HEMOGLOBIN A1C: Hgb A1c MFr Bld: 16.2 % — ABNORMAL HIGH (ref 4.6–6.5)

## 2020-10-06 LAB — GLUCOSE, POCT (MANUAL RESULT ENTRY): POC Glucose: 480 mg/dl — AB (ref 70–99)

## 2020-10-06 MED ORDER — PEN NEEDLES 32G X 4 MM MISC: 3 refills | Status: DC

## 2020-10-06 MED ORDER — BLOOD GLUCOSE METER KIT: PACK | 0 refills | Status: AC

## 2020-10-06 MED ORDER — METFORMIN HCL 500 MG PO TABS: 500.0000 mg | ORAL_TABLET | Freq: Two times a day (BID) | ORAL | 3 refills | Status: DC

## 2020-10-06 MED ORDER — LANTUS SOLOSTAR 100 UNIT/ML ~~LOC~~ SOPN: 10.0000 [IU] | PEN_INJECTOR | Freq: Every day | SUBCUTANEOUS | 1 refills | Status: DC

## 2020-10-06 NOTE — Progress Notes (Signed)
Subjective:  Patient ID: Kevin Jarvis, male    DOB: [birth year]  Age: 55 y.o. MRN: [id number]  CC:  Chief Complaint  Patient presents with   Hyperglycemia    Pt had high glucose reading and was recommended to be seen as soon as possible. Pt does not have any previous readings at this time, glucose in office today 480   Immunizations    Pt due for shingles and pneumonia vaccines, open to receiving both today if advised    Hypertension    Pt had recent short fill of amlodipine and needs HTN rechecked, denies physical sxs, in range in office today     HPI Kevin Jarvis presents for   Hyperglycemia Note received from his cardiologist regarding significant hyperglycemia, with glucose 596 on June 30.  Per cardiology notes plan for repeat BMP but that was not performed.  No known history of diabetes or current medications for diabetes.  A1c at prediabetes level in June 2021. Weight has decreased from 212 in March to 197 today. Increased thirst past few weeks, blurry vision past few weeks.  No n/v/abd pain.  No confusion/disorientation or headache.  No recent diet changes. Drinks sweet tea rarely.   Lab Results  Component Value Date   HGBA1C 5.7 (H) 09/24/2019   Wt Readings from Last 3 Encounters:  10/06/20 197 lb 12.8 oz (89.7 kg)  08/22/20 215 lb 9.6 oz (97.8 kg)  06/16/20 212 lb 9.6 oz (96.4 kg)     Hypertension: Followed by cardiology with medication adjustment, recently increased his valsartan to 320 mg daily.  Elevated creatinine June 30 (repeat BMP on higher dose losartan with creatinine slightly increasing from 1.24-1.36. Only taking 166m valsartan QD now. Misunderstood plan prior - took both 3277mand 16052mose for a few days. Felt dizzy. Better now on 160m53m BP Readings from Last 3 Encounters:  10/06/20 138/76  08/22/20 (!) 172/94  06/16/20 (!) 174/92   Lab Results  Component Value Date   CREATININE 1.36 (H) 09/28/2020    Deferring vaccine updates today  until future visit.   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 Allergies  Allergen Reactions   Chlorthalidone Other (See Comments)    lightheaded   Prior to Admission medications   Medication Sig Start Date End Date Taking? Authorizing Provider  amLODipine (NORVASC) 10 MG tablet Take 1 tablet by mouth once daily 09/11/20   GreeWendie Agreste  chlorthalidone (HYGROTON) 25 MG tablet Take 1 tablet (25 mg total) by mouth daily. 08/22/20 11/20/20  RandSkeet Latch  spironolactone (ALDACTONE) 25 MG tablet Take 1 tablet (25 mg total) by mouth daily. 03/06/20 06/04/20  O'NeGeralynn Rile  valsartan (DIOVAN) 320 MG tablet Take 1 tablet (320 mg total) by mouth daily. 09/22/20   CleaDeberah Pelton   Social History   Socioeconomic History   Marital status: Married    Spouse name: Not on file   Number of children: 2   Years of education: Not on file   Highest education level: Not on file  Occupational History   Occupation: machGlass blower/designerbacco  Use   Smoking status: Never   Smokeless tobacco: Never  Substance and Sexual Activity   Alcohol use: Yes   Drug use: No   Sexual activity: Not on file  Other Topics Concern   Not on file  Social History Narrative   From [place] - Moved to [place] 2000   Lives with wife and son   Social Determinants of Radio broadcast assistant Strain: Not on file  Food Insecurity: Not on file  Transportation Needs: Not on file  Physical Activity: Not on file  Stress: Not on file  Social Connections: Not on file  Intimate Partner Violence: Not on file    Review of Systems  Constitutional:  Positive for fatigue and  unexpected weight change.  Eyes:  Positive for visual disturbance.  Respiratory:  Negative for cough, chest tightness and shortness of breath.   Cardiovascular:  Negative for chest pain, palpitations and leg swelling.  Gastrointestinal:  Negative for abdominal pain and blood in stool.  Endocrine: Positive for polydipsia and polyuria.  Genitourinary:  Positive for frequency. Negative for dysuria.  Neurological:  Negative for dizziness, light-headedness and headaches.    Objective:   Vitals:   10/06/20 0858  BP: 138/76  Pulse: 75  Resp: 15  Temp: 98.2 F (36.8 C)  TempSrc: Temporal  SpO2: 96%  Weight: 197 lb 12.8 oz (89.7 kg)  Height: _0  (1.753 m)     Physical Exam Vitals reviewed.  Constitutional:      General: He is not in acute distress.    Appearance: He is well-developed. He is not toxic-appearing or diaphoretic.  HENT:     Head: Normocephalic and atraumatic.  Neck:     Vascular: No carotid bruit or JVD.  Cardiovascular:     Rate and Rhythm: Normal rate and regular rhythm.     Heart sounds: Normal heart sounds. No murmur heard. Pulmonary:     Effort: Pulmonary effort is normal.     Breath sounds: Normal breath sounds. No rales.  Abdominal:     General: Abdomen is flat. There is no distension.     Palpations: Abdomen is soft.  Musculoskeletal:     Right lower leg: No edema.     Left lower leg: No edema.  Skin:    General: Skin is warm and dry.  Neurological:     Mental Status: He is alert and oriented to person, place, and time.  Psychiatric:        Mood and Affect: Mood normal.     Results for orders placed or performed in visit on 10/06/20  POCT glucose (manual entry)  Result Value Ref Range   POC Glucose 480 (A) 70 - 99 mg/dl    Assessment & Plan:  Kevin Jarvis is a 55 y.o. male . Diabetes mellitus, new onset ([place]) - Plan: Basic metabolic panel, insulin glargine (LANTUS SOLOSTAR) 100 UNIT/ML Solostar Pen, Insulin Pen Needle (PEN NEEDLES) 32G X  4 MM MISC, blood glucose meter kit and supplies, metFORMIN (GLUCOPHAGE) 500 MG tablet, Hemoglobin A1c Hyperglycemia - Plan: POCT glucose (manual entry), Basic metabolic panel  -New onset diabetes, no current signs or symptoms of diabetic ketoacidosis or hyperosmolar state.  Lower reading in office today at 480.  Start Lantus Solostar 10 units daily, metformin 500 mg daily initially then twice daily.  Potential side effects of meds discussed.  Hypoglycemia precautions.  Demonstrated Solostar use in office with demonstration device.  Handout given.  Understanding expressed.  Recheck 1 week  Resistant hypertension  -Stable on 160 mg valsartan dose, but close monitoring as may need to go to higher dose as recommended by cardiology.  Elevated serum creatinine - Plan: Basic metabolic panel  -Repeat creatinine with BMP.  Meds ordered this encounter  Medications   insulin glargine (LANTUS SOLOSTAR) 100 UNIT/ML Solostar Pen    Sig: Inject 10 Units into the skin daily.    Dispense:  15 mL    Refill:  1   Insulin Pen Needle (PEN NEEDLES) 32G X 4 MM MISC    Sig: Use with solostar pen daily.    Dispense:  100 each    Refill:  3   blood glucose meter kit and supplies    Sig: Dispense based on patient and insurance preference. Use 2 times daily - fasting or 2 hours after meals.    Dispense:  1 each    Refill:  0    Order Specific Question:   Number of strips    Answer:   100    Order Specific Question:   Number of lancets    Answer:   100   metFORMIN (GLUCOPHAGE) 500 MG tablet    Sig: Take 1 tablet (500 mg total) by mouth 2 (two) times daily with a meal. Start QD for first few days, increase to BID if tolerated.    Dispense:  180 tablet    Refill:  3   Patient Instructions  Unfortunately you do have diabetes with very high sugar recently.  Start metformin once per day, then increase to twice per day if tolerated.  Lantus insulin 10 units per day for now. See info below. Recheck in 1 week.   Return to the clinic or go to the nearest emergency room if any of your symptoms worsen or new symptoms occur.   Keep a record of your blood pressures outside of the office and running above 140/90, may need to change back to 363m valsartan. This can be discussed with cardiology as well.   Diabetes Mellitus Basics  Diabetes mellitus, or diabetes, is a long-term (chronic) disease. It occurs when the body does not properly use sugar (glucose) that is released from food after you eat. Diabetes mellitus may be caused by one or both of these problems: Your pancreas does not make enough of a hormone called insulin. Your body does not react in a normal way to the insulin that it makes. Insulin lets glucose enter cells in your body. This gives you energy. If you have diabetes, glucose cannot get into cells. This causes high blood glucose (hyperglycemia). How to treat and manage diabetes You may need to take insulin or other diabetes medicines daily to keep your glucose in balance. If you are prescribed insulin, you will learn how to give yourself insulin by injection. You may need to adjust the amount of insulin youtake based on the foods that you eat. You will need to check your blood glucose levels using a glucose monitor as told by your health care provider. The readings can help determine if you havelow or high blood glucose. Generally, you should have these blood glucose levels: Before meals (preprandial): 80-130 mg/dL (4.4-7.2 mmol/L). After meals (postprandial): below 180 mg/dL (10 mmol/L). Hemoglobin A1c (HbA1c) level: less than 7%. Your health care provider will set treatment goals for you. Keep all follow-up visits. This is important. Follow these instructions at home: Diabetes medicines Take your diabetes medicines every day as told by your health care provider. List your diabetes medicines  here: Name of medicine: ______________________________ Amount (dose): _______________ Time  (a.m./p.m.): _______________ Notes: ___________________________________ Name of medicine: ______________________________ Amount (dose): _______________ Time (a.m./p.m.): _______________ Notes: ___________________________________ Name of medicine: ______________________________ Amount (dose): _______________ Time (a.m./p.m.): _______________ Notes: ___________________________________ Insulin If you use insulin, list the types of insulin you use here: Insulin type: ______________________________ Amount (dose): _______________ Time (a.m./p.m.): _______________Notes: ___________________________________ Insulin type: ______________________________ Amount (dose): _______________ Time (a.m./p.m.): _______________ Notes: ___________________________________ Insulin type: ______________________________ Amount (dose): _______________ Time (a.m./p.m.): _______________ Notes: ___________________________________ Insulin type: ______________________________ Amount (dose): _______________ Time (a.m./p.m.): _______________ Notes: ___________________________________ Insulin type: ______________________________ Amount (dose): _______________ Time (a.m./p.m.): _______________ Notes: ___________________________________ Managing blood glucose  Check your blood glucose levels using a glucose monitor as told by your healthcare provider. Write down the times that you check your glucose levels here: Time: _______________ Notes: ___________________________________ Time: _______________ Notes: ___________________________________ Time: _______________ Notes: ___________________________________ Time: _______________ Notes: ___________________________________ Time: _______________ Notes: ___________________________________ Time: _______________ Notes: ___________________________________  Low blood glucose Low blood glucose (hypoglycemia) is when glucose is at or below 70 mg/dL (3.9 mmol/L). Symptoms may  include: Feeling: Hungry. Sweaty and clammy. Irritable or easily upset. Dizzy. Sleepy. Having: A fast heartbeat. A headache. A change in your vision. Numbness around the mouth, lips, or tongue. Having trouble with: Moving (coordination). Sleeping. Treating low blood glucose To treat low blood glucose, eat or drink something containing sugar right away. If you can think clearly and swallow safely, follow the 15:15 rule: Take 15 grams of a fast-acting carb (carbohydrate), as told by your health care provider. Some fast-acting carbs are: Glucose tablets: take 3-4 tablets. Hard candy: eat 3-5 pieces. Fruit juice: drink 4 oz (120 mL). Regular (not diet) soda: drink 4-6 oz (120-180 mL). Honey or sugar: eat 1 Tbsp (15 mL). Check your blood glucose levels 15 minutes after you take the carb. If your glucose is still at or below 70 mg/dL (3.9 mmol/L), take 15 grams of a carb again. If your glucose does not go above 70 mg/dL (3.9 mmol/L) after 3 tries, get help right away. After your glucose goes back to normal, eat a meal or a snack within 1 hour. Treating very low blood glucose If your glucose is at or below 54 mg/dL (3 mmol/L), you have very low blood glucose (severe hypoglycemia). This is an emergency. Do not wait to see if the symptoms will go away. Get medical help right away. Call your local emergency services (911 in the [place] Do not drive yourself to the hospital. Questions to ask your health care provider Should I talk with a diabetes educator? What equipment will I need to care for myself at home? What diabetes medicines do I need? When should I take them? How often do I need to check my blood glucose levels? What number can I call if I have questions? When is my follow-up visit? Where can I find a support group for people with diabetes? Where to find more information American Diabetes Association: www.diabetes.org Association of Diabetes Care and Education Specialists:  www.diabeteseducator.org Contact a health care provider if: Your blood glucose is at or above 240 mg/dL (13.3 mmol/L) for 2 days in a row. You have been sick or have had a fever for 2 days or more, and you are not getting better. You have any of these problems for more than 6 hours: You cannot eat or drink. You feel nauseous. You vomit. You have diarrhea. Get help right away if: Your blood glucose is lower than 54 mg/dL (3 mmol/L). You get confused. You  have trouble thinking clearly. You have trouble breathing. These symptoms may represent a serious problem that is an emergency. Do not wait to see if the symptoms will go away. Get medical help right away. Call your local emergency services (911 in the [place] Do not drive yourself to the hospital. Summary Diabetes mellitus is a chronic disease that occurs when the body does not properly use sugar (glucose) that is released from food after you eat. Take insulin and diabetes medicines as told. Check your blood glucose every day, as often as told. Keep all follow-up visits. This is important. This information is not intended to replace advice given to you by your health care provider. Make sure you discuss any questions you have with your healthcare provider. Document Revised: 07/20/2019 Document Reviewed: 07/20/2019 Elsevier Patient Education  [place].  Insulin Injection Instructions, Using Insulin Pens, Adult There are many different types of insulin. The type of insulin that you take may determine how many injections you give yourself and when you need to givethe injections. Supplies needed: Soap and water. Your insulin pen. A new needle. Alcohol wipes. A disposal container for sharp items (sharps container), such as an empty plastic bottle with a cover. How to choose a site for injection The body absorbs insulin differently, depending on where the insulin is injected (injection site). It is best to inject insulin into the  same body area each time; for example, always in the abdomen. However, you should use a different spot in that area for each injection. Do not inject the insulin in the same spot each time. There are five main areas that can be used for injecting. These areas are: Abdomen. This is the preferred area. Front of thigh. Upper, outer side of thigh. Upper, outer side of arm. Upper, outer part of buttock. How to use an insulin pen  Get ready Wash your hands with soap and water for at least 20 seconds. If soap and water are not available, use hand sanitizer. Test your blood sugar (glucose) level and write down that number. Follow any instructions from your health care provider about what to do if your blood glucose level is higher or lower than your normal range. Make sure the insulin pen has the right kind of insulin and there is enough insulin in the pen for the dose. Check the expiration date. If you are using CLEAR insulin, check to see that it is clear and free of clumps. If you are using CLOUDY insulin, mix it by gently rolling the insulin pen between your palms several times. Do not shake the pen. Remove the cap from the insulin pen. Use an alcohol wipe to clean the rubber tip of the pen. Remove the protective paper tab from the disposable needle. Do not let the needle touch anything. Screw a new, unused needle onto the pen. Remove the outer plastic needle cover. Do not throw away the outer plastic cover yet. If the pen uses a special safety needle, leave the inner needle shield in place. If the pen does not use a special safety needle, remove the inner plastic cover from the needle. If you skip this step, you may not get the right amount of insulin. Follow the manufacturer's instructions to prime the insulin pen with the volume of insulin needed. Hold the pen with the needle pointing up, and push the button on the opposite end of the pen until a drop of insulin appears at the needle tip. If no  insulin appears,  repeat this step. Turn the button (dial) to the number of units of insulin that you will be injecting. Inject the insulin Use an alcohol wipe to clean the site where you will be inserting the needle. Let the site air-dry. Grip the base of the pen with a loose fist and rest your thumb on the pen or hold the pen in the palm of your writing hand like a pencil. If directed by your health care provider, use your other hand to pinch and hold about 1 inch (2.5 cm) of skin at the injection site. Do not directly touch the cleaned part of the skin. Gently but quickly, use your writing hand to put the needle straight into the skin. Insert the needle at a 45-degree angle or a 90-degree angle (perpendicular) to the skin, as directed by your health care provider. When the needle is completely inserted into the skin, let go of the skin that you are pinching. Use your thumb or index finger of your writing hand to push the top button of the pen all the way to inject the insulin. Continue to hold the pen in place with your writing hand. Wait 10 seconds, then pull the needle straight out of the skin. This will allow all of the insulin to go from the pen and needle into your body. Carefully put the outer plastic cover of the needle back over the needle, then unscrew the capped needle and discard it in a sharps container, such as an empty plastic bottle with a cover. Put the plastic cap back on the insulin pen. How to throw away supplies Discard all used needles in a sharps container. Follow the disposal regulations for the area where you live. Do not use any needle more than one time. Throw away empty disposable pens in the regular trash. Questions to ask your health care provider How often should I be taking insulin? How often should I check my blood glucose? What amount of insulin should I be taking each time? What are the side effects? What should I do if my blood glucose is too high? What  should I do if my blood glucose is too low? What should I do if I forget to take my insulin? What number should I call if I have questions? Where to find more information American Diabetes Association (ADA): www.diabetes.org Association of Diabetes Care and Education Specialists (ADCES): www.diabeteseducator.org Summary Before you give yourself an insulin injection, be sure to wash your hands for at least 20 seconds and test your blood glucose level. Write down that number. Check the expiration date and the type of insulin that is in the pen. The type of insulin that you take may determine how many injections you give yourself and when you need to give the injections. It is best to inject insulin into the same body area each time; for example, always in the abdomen. However, you should use a different spot in that area for each injection. Do not use a needle more than one time. This information is not intended to replace advice given to you by your health care provider. Make sure you discuss any questions you have with your healthcare provider. Document Revised: 01/17/2020 Document Reviewed: 01/15/2020 Elsevier Patient Education  2022 [place],   Merri Ray, MD [place], [facility] 10/06/20 9:48 AM

## 2020-10-06 NOTE — Patient Instructions (Addendum)
Unfortunately you do have diabetes with very high sugar recently.  Start metformin once per day, then increase to twice per day if tolerated.  Lantus insulin 10 units per day for now. See info below. Recheck in 1 week.  Return to the clinic or go to the nearest emergency room if any of your symptoms worsen or new symptoms occur.   Keep a record of your blood pressures outside of the office and running above 140/90, may need to change back to 320mg  valsartan. This can be discussed with cardiology as well.   Diabetes Mellitus Basics  Diabetes mellitus, or diabetes, is a long-term (chronic) disease. It occurs when the body does not properly use sugar (glucose) that is released from food after you eat. Diabetes mellitus may be caused by one or both of these problems: Your pancreas does not make enough of a hormone called insulin. Your body does not react in a normal way to the insulin that it makes. Insulin lets glucose enter cells in your body. This gives you energy. If you have diabetes, glucose cannot get into cells. This causes high blood glucose (hyperglycemia). How to treat and manage diabetes You may need to take insulin or other diabetes medicines daily to keep your glucose in balance. If you are prescribed insulin, you will learn how to give yourself insulin by injection. You may need to adjust the amount of insulin youtake based on the foods that you eat. You will need to check your blood glucose levels using a glucose monitor as told by your health care provider. The readings can help determine if you havelow or high blood glucose. Generally, you should have these blood glucose levels: Before meals (preprandial): 80-130 mg/dL (4.4-7.2 mmol/L). After meals (postprandial): below 180 mg/dL (10 mmol/L). Hemoglobin A1c (HbA1c) level: less than 7%. Your health care provider will set treatment goals for you. Keep all follow-up visits. This is important. Follow these instructions at  home: Diabetes medicines Take your diabetes medicines every day as told by your health care provider. List your diabetes medicines here: Name of medicine: ______________________________ Amount (dose): _______________ Time (a.m./p.m.): _______________ Notes: ___________________________________ Name of medicine: ______________________________ Amount (dose): _______________ Time (a.m./p.m.): _______________ Notes: ___________________________________ Name of medicine: ______________________________ Amount (dose): _______________ Time (a.m./p.m.): _______________ Notes: ___________________________________ Insulin If you use insulin, list the types of insulin you use here: Insulin type: ______________________________ Amount (dose): _______________ Time (a.m./p.m.): _______________Notes: ___________________________________ Insulin type: ______________________________ Amount (dose): _______________ Time (a.m./p.m.): _______________ Notes: ___________________________________ Insulin type: ______________________________ Amount (dose): _______________ Time (a.m./p.m.): _______________ Notes: ___________________________________ Insulin type: ______________________________ Amount (dose): _______________ Time (a.m./p.m.): _______________ Notes: ___________________________________ Insulin type: ______________________________ Amount (dose): _______________ Time (a.m./p.m.): _______________ Notes: ___________________________________ Managing blood glucose  Check your blood glucose levels using a glucose monitor as told by your healthcare provider. Write down the times that you check your glucose levels here: Time: _______________ Notes: ___________________________________ Time: _______________ Notes: ___________________________________ Time: _______________ Notes: ___________________________________ Time: _______________ Notes: ___________________________________ Time: _______________ Notes:  ___________________________________ Time: _______________ Notes: ___________________________________  Low blood glucose Low blood glucose (hypoglycemia) is when glucose is at or below 70 mg/dL (3.9 mmol/L). Symptoms may include: Feeling: Hungry. Sweaty and clammy. Irritable or easily upset. Dizzy. Sleepy. Having: A fast heartbeat. A headache. A change in your vision. Numbness around the mouth, lips, or tongue. Having trouble with: Moving (coordination). Sleeping. Treating low blood glucose To treat low blood glucose, eat or drink something containing sugar right away. If you can think clearly and swallow safely, follow the 15:15 rule: Take 15  grams of a fast-acting carb (carbohydrate), as told by your health care provider. Some fast-acting carbs are: Glucose tablets: take 3-4 tablets. Hard candy: eat 3-5 pieces. Fruit juice: drink 4 oz (120 mL). Regular (not diet) soda: drink 4-6 oz (120-180 mL). Honey or sugar: eat 1 Tbsp (15 mL). Check your blood glucose levels 15 minutes after you take the carb. If your glucose is still at or below 70 mg/dL (3.9 mmol/L), take 15 grams of a carb again. If your glucose does not go above 70 mg/dL (3.9 mmol/L) after 3 tries, get help right away. After your glucose goes back to normal, eat a meal or a snack within 1 hour. Treating very low blood glucose If your glucose is at or below 54 mg/dL (3 mmol/L), you have very low blood glucose (severe hypoglycemia). This is an emergency. Do not wait to see if the symptoms will go away. Get medical help right away. Call your local emergency services (911 in the [place] Do not drive yourself to the hospital. Questions to ask your health care provider Should I talk with a diabetes educator? What equipment will I need to care for myself at home? What diabetes medicines do I need? When should I take them? How often do I need to check my blood glucose levels? What number can I call if I have questions? When  is my follow-up visit? Where can I find a support group for people with diabetes? Where to find more information American Diabetes Association: www.diabetes.org Association of Diabetes Care and Education Specialists: www.diabeteseducator.org Contact a health care provider if: Your blood glucose is at or above 240 mg/dL (13.3 mmol/L) for 2 days in a row. You have been sick or have had a fever for 2 days or more, and you are not getting better. You have any of these problems for more than 6 hours: You cannot eat or drink. You feel nauseous. You vomit. You have diarrhea. Get help right away if: Your blood glucose is lower than 54 mg/dL (3 mmol/L). You get confused. You have trouble thinking clearly. You have trouble breathing. These symptoms may represent a serious problem that is an emergency. Do not wait to see if the symptoms will go away. Get medical help right away. Call your local emergency services (911 in the [place] Do not drive yourself to the hospital. Summary Diabetes mellitus is a chronic disease that occurs when the body does not properly use sugar (glucose) that is released from food after you eat. Take insulin and diabetes medicines as told. Check your blood glucose every day, as often as told. Keep all follow-up visits. This is important. This information is not intended to replace advice given to you by your health care provider. Make sure you discuss any questions you have with your healthcare provider. Document Revised: 07/20/2019 Document Reviewed: 07/20/2019 Elsevier Patient Education  [place].  Insulin Injection Instructions, Using Insulin Pens, Adult There are many different types of insulin. The type of insulin that you take may determine how many injections you give yourself and when you need to givethe injections. Supplies needed: Soap and water. Your insulin pen. A new needle. Alcohol wipes. A disposal container for sharp items (sharps container),  such as an empty plastic bottle with a cover. How to choose a site for injection The body absorbs insulin differently, depending on where the insulin is injected (injection site). It is best to inject insulin into the same body area each time; for example, always in  the abdomen. However, you should use a different spot in that area for each injection. Do not inject the insulin in the same spot each time. There are five main areas that can be used for injecting. These areas are: Abdomen. This is the preferred area. Front of thigh. Upper, outer side of thigh. Upper, outer side of arm. Upper, outer part of buttock. How to use an insulin pen  Get ready Wash your hands with soap and water for at least 20 seconds. If soap and water are not available, use hand sanitizer. Test your blood sugar (glucose) level and write down that number. Follow any instructions from your health care provider about what to do if your blood glucose level is higher or lower than your normal range. Make sure the insulin pen has the right kind of insulin and there is enough insulin in the pen for the dose. Check the expiration date. If you are using CLEAR insulin, check to see that it is clear and free of clumps. If you are using CLOUDY insulin, mix it by gently rolling the insulin pen between your palms several times. Do not shake the pen. Remove the cap from the insulin pen. Use an alcohol wipe to clean the rubber tip of the pen. Remove the protective paper tab from the disposable needle. Do not let the needle touch anything. Screw a new, unused needle onto the pen. Remove the outer plastic needle cover. Do not throw away the outer plastic cover yet. If the pen uses a special safety needle, leave the inner needle shield in place. If the pen does not use a special safety needle, remove the inner plastic cover from the needle. If you skip this step, you may not get the right amount of insulin. Follow the manufacturer's  instructions to prime the insulin pen with the volume of insulin needed. Hold the pen with the needle pointing up, and push the button on the opposite end of the pen until a drop of insulin appears at the needle tip. If no insulin appears, repeat this step. Turn the button (dial) to the number of units of insulin that you will be injecting. Inject the insulin Use an alcohol wipe to clean the site where you will be inserting the needle. Let the site air-dry. Grip the base of the pen with a loose fist and rest your thumb on the pen or hold the pen in the palm of your writing hand like a pencil. If directed by your health care provider, use your other hand to pinch and hold about 1 inch (2.5 cm) of skin at the injection site. Do not directly touch the cleaned part of the skin. Gently but quickly, use your writing hand to put the needle straight into the skin. Insert the needle at a 45-degree angle or a 90-degree angle (perpendicular) to the skin, as directed by your health care provider. When the needle is completely inserted into the skin, let go of the skin that you are pinching. Use your thumb or index finger of your writing hand to push the top button of the pen all the way to inject the insulin. Continue to hold the pen in place with your writing hand. Wait 10 seconds, then pull the needle straight out of the skin. This will allow all of the insulin to go from the pen and needle into your body. Carefully put the outer plastic cover of the needle back over the needle, then unscrew the capped needle and discard  it in a sharps container, such as an empty plastic bottle with a cover. Put the plastic cap back on the insulin pen. How to throw away supplies Discard all used needles in a sharps container. Follow the disposal regulations for the area where you live. Do not use any needle more than one time. Throw away empty disposable pens in the regular trash. Questions to ask your health care  provider How often should I be taking insulin? How often should I check my blood glucose? What amount of insulin should I be taking each time? What are the side effects? What should I do if my blood glucose is too high? What should I do if my blood glucose is too low? What should I do if I forget to take my insulin? What number should I call if I have questions? Where to find more information American Diabetes Association (ADA): www.diabetes.org Association of Diabetes Care and Education Specialists (ADCES): www.diabeteseducator.org Summary Before you give yourself an insulin injection, be sure to wash your hands for at least 20 seconds and test your blood glucose level. Write down that number. Check the expiration date and the type of insulin that is in the pen. The type of insulin that you take may determine how many injections you give yourself and when you need to give the injections. It is best to inject insulin into the same body area each time; for example, always in the abdomen. However, you should use a different spot in that area for each injection. Do not use a needle more than one time. This information is not intended to replace advice given to you by your health care provider. Make sure you discuss any questions you have with your healthcare provider. Document Revised: 01/17/2020 Document Reviewed: 01/15/2020 Elsevier Patient Education  2022 [employer].

## 2020-10-10 NOTE — Telephone Encounter (Signed)
LM2CB 

## 2020-10-11 NOTE — Telephone Encounter (Signed)
Can't increase spironolactone d/t low sodium.  Lets add hydralazine 25 mg tid

## 2020-10-11 NOTE — Telephone Encounter (Signed)
Kevin Jarvis Sent: 10/10/2020   9:23 AM EDT  Marilynn Rail Jossie Ng, NP; Waylan Rocher, LPN  Hi [name],   This patient had elevated readings this past weekend. 170/110 and 183/113 (7/10). Readings beforehand were also elevated.   Thanks,  Amy  [id number]

## 2020-10-11 NOTE — Telephone Encounter (Signed)
Left detailed message to [facility] DJ:[id number]

## 2020-10-12 ENCOUNTER — Ambulatory Visit (INDEPENDENT_AMBULATORY_CARE_PROVIDER_SITE_OTHER): Payer: BC Managed Care – PPO | Admitting: Family Medicine

## 2020-10-12 ENCOUNTER — Other Ambulatory Visit: Payer: Self-pay

## 2020-10-12 VITALS — BP 132/70 | HR 94 | Temp 98.2°F | Resp 17 | Ht 69.0 in | Wt 199.8 lb

## 2020-10-12 DIAGNOSIS — Z13220 Encounter for screening for lipoid disorders: Secondary | ICD-10-CM | POA: Diagnosis not present

## 2020-10-12 DIAGNOSIS — Z23 Encounter for immunization: Secondary | ICD-10-CM

## 2020-10-12 DIAGNOSIS — E119 Type 2 diabetes mellitus without complications: Secondary | ICD-10-CM

## 2020-10-12 DIAGNOSIS — I10 Essential (primary) hypertension: Secondary | ICD-10-CM

## 2020-10-12 LAB — MICROALBUMIN / CREATININE URINE RATIO
Creatinine,U: 103.6 mg/dL
Microalb Creat Ratio: 1 mg/g (ref 0.0–30.0)
Microalb, Ur: 1.1 mg/dL (ref 0.0–1.9)

## 2020-10-12 MED ORDER — AMLODIPINE BESYLATE 10 MG PO TABS: 10.0000 mg | ORAL_TABLET | Freq: Every day | ORAL | 1 refills | Status: DC

## 2020-10-12 NOTE — Progress Notes (Signed)
Subjective:  Patient ID: Kevin Jarvis, male    DOB: 1966-02-20  Age: 55 y.o. MRN: [id number]  CC:  Chief Complaint  Patient presents with   Diabetes    Pt here to have 1 week recheck on new onset diabetes, no questions today, pt feels well. Pt reports decrease in urinary frequency   Immunizations    Pt due for shingles vaccine and pneumonia pt is willing to get vaccines as advised by provider    Hypertension    Pt needs refill of amlodipine, reports no physical sxs, checks bp with home monitor     HPI Kevin Jarvis presents for   Diabetes: Recent new diagnosis, with A1c 16.2, previous glucose of 596 at cardiology, in the 400s at his in person visit with me on July 8.  Normal CO2 at 28.   Started on metformin, Lantus on July 8 visit.  He is on ARB. Currently taking 10u of Lantus QD, just started 2 nights ago, on metformin 574m qd past 3 days.  Feels better. Less thirst.  No home readings.   No n/v/abd pain, less blurry vision. No gi intolerance/new side effects of meds.  Microalbumin: due. Optho, foot exam, pneumovax: has optho. Due for visit.  Small amt of food for med few hrs ago.   Lab Results  Component Value Date   HGBA1C 16.2 (H) 10/06/2020   HGBA1C 5.7 (H) 09/24/2019   HGBA1C 5.8 (H) 11/30/2018   Lab Results  Component Value Date   MICROALBUR 1.1 10/12/2020   L[place]84 10/12/2020   CREATININE 1.40 10/12/2020   Hypertension: See last visit, followed by cardiology with recent med adjustment but had misunderstood directions.  Was only taking 160 mg valsartan after previous combination of both 160 and 320 mg.  BP stable at 138/76 last visit, continued on same regimen of 160 mg, along with his amlodipine 163mqd.  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 Allergies  Allergen Reactions   Chlorthalidone Other (See Comments)    lightheaded   Prior to Admission medications   Medication Sig Start Date End Date Taking? Authorizing Provider  amLODipine (NORVASC) 10 MG tablet Take 1 tablet by mouth once daily 09/11/20  Yes GrWendie AgresteMD  blood glucose meter kit and supplies Dispense based on patient and insurance preference. Use 2 times daily - fasting or 2 hours after meals. 10/06/20  Yes GrWendie AgresteMD  chlorthalidone (HYGROTON) 25 MG tablet Take 1 tablet (25 mg total) by mouth daily. 08/22/20 11/20/20 Yes RaSkeet LatchMD  insulin glargine (LANTUS SOLOSTAR) 100 UNIT/ML Solostar Pen Inject 10 Units into the skin daily. 10/06/20  Yes GrWendie AgresteMD  Insulin Pen Needle (PEN NEEDLES) 32G X 4 MM MISC Use with solostar pen daily. 10/06/20  Yes GrWendie AgresteMD  metFORMIN (GLUCOPHAGE) 500 MG tablet Take 1 tablet (500 mg total) by mouth  2 (two) times daily with a meal. Start QD for first few days, increase to BID if tolerated. 10/06/20  Yes Wendie Agreste, MD  valsartan (DIOVAN) 320 MG tablet Take 1 tablet (320 mg total) by mouth daily. 09/22/20  Yes Deberah Pelton, NP  spironolactone (ALDACTONE) 25 MG tablet Take 1 tablet (25 mg total) by mouth daily. 03/06/20 06/04/20  O'Neal, Cassie Freer, MD   Social History   Socioeconomic History   Marital status: Married    Spouse name: Not on file   Number of children: 2   Years of education: Not on file   Highest education level: Not on file  Occupational History   Occupation: Glass blower/designer  Tobacco Use   Smoking status: Never   Smokeless tobacco:  Never  Substance and Sexual Activity   Alcohol use: Yes   Drug use: No   Sexual activity: Not on file  Other Topics Concern   Not on file  Social History Narrative   From [place] - Moved to [place] 2000   Lives with wife and son   Social Determinants of Health   Financial Resource Strain: Not on file  Food Insecurity: Not on file  Transportation Needs: Not on file  Physical Activity: Not on file  Stress: Not on file  Social Connections: Not on file  Intimate Partner Violence: Not on file    Review of Systems Per HPi.   Objective:   Vitals:   10/12/20 1307  BP: 132/70  Pulse: 94  Resp: 17  Temp: 98.2 F (36.8 C)  TempSrc: Temporal  SpO2: 97%  Weight: 199 lb 12.8 oz (90.6 kg)  Height: 5' 9"  (1.753 m)     Physical Exam Vitals reviewed.  Constitutional:      General: He is not in acute distress.    Appearance: He is well-developed. He is not ill-appearing or toxic-appearing.  HENT:     Head: Normocephalic and atraumatic.  Neck:     Vascular: No carotid bruit or JVD.  Cardiovascular:     Rate and Rhythm: Normal rate and regular rhythm.     Heart sounds: Normal heart sounds. No murmur heard. Pulmonary:     Effort: Pulmonary effort is normal.     Breath sounds: Normal breath sounds. No rales.  Musculoskeletal:     Right lower leg: No edema.     Left lower leg: No edema.  Skin:    General: Skin is warm and dry.  Neurological:     Mental Status: He is alert and oriented to person, place, and time.  Psychiatric:        Mood and Affect: Mood normal.   In office glucose 372.  Assessment & Plan:  Kevin Jarvis is a 55 y.o. male . Diabetes mellitus, new onset ([place]) - Plan: Microalbumin / creatinine urine ratio, Comprehensive metabolic panel, POCT glucose (manual entry)  -Still uncontrolled but improving.  Check microalbumin.  Increase Lantus to 12 units, with increased by 2 units every 3 days until readings below 200.  Teach back approach for understanding  expressed.  Essential hypertension - Plan: amLODipine (NORVASC) 10 MG tablet  -Stable on current regimen, refill amlodipine, option of higher dose losartan if elevated readings per cardiology plan  Screening for hyperlipidemia - Plan: Comprehensive metabolic panel, Lipid panel  Need for pneumococcal vaccination - Plan: Pneumococcal conjugate vaccine 20-valent (Prevnar 20)  Need for shingles vaccine - Plan: Varicella-zoster vaccine IM   Meds ordered this encounter  Medications   amLODipine (  NORVASC) 10 MG tablet    Sig: Take 1 tablet (10 mg total) by mouth daily.    Dispense:  90 tablet    Refill:  1   Patient Instructions  If blood pressure remains over 140/90 at home, you can change to the new dose of valsartan 329m as recommended by cardiology. Watch for new side effects at that dose. Same dose amlodipine for now.   Let your eye doctor know that you have diabetes so they can send me a report.  Increase lantus insulin to 12 units tonight, then increase by 2 units every 3 days until blood sugar is under 200.  Once you get a reading below 200, stay at that dose.  Can increase metformin to twice per day if you are tolerating that medication.  Recheck in 2 weeks, sooner if any new symptoms or questions sooner.            Signed,   JMerri Ray MD LMcPherson S[facility] CityGroup 10/14/20 12:25 PM

## 2020-10-12 NOTE — Patient Instructions (Addendum)
If blood pressure remains over 140/90 at home, you can change to the new dose of valsartan 320mg  as recommended by cardiology. Watch for new side effects at that dose. Same dose amlodipine for now.   Let your eye doctor know that you have diabetes so they can send me a report.  Increase lantus insulin to 12 units tonight, then increase by 2 units every 3 days until blood sugar is under 200.  Once you get a reading below 200, stay at that dose.  Can increase metformin to twice per day if you are tolerating that medication.  Recheck in 2 weeks, sooner if any new symptoms or questions sooner.

## 2020-10-13 ENCOUNTER — Other Ambulatory Visit: Payer: Self-pay

## 2020-10-13 LAB — LIPID PANEL
Cholesterol: 148 mg/dL (ref 0–200)
HDL: 31 mg/dL — ABNORMAL LOW (ref >39.00)
LDL Cholesterol: 84 mg/dL (ref 0–99)
NonHDL: 117.34
Total CHOL/HDL Ratio: 5
Triglycerides: 166 mg/dL — ABNORMAL HIGH (ref 0.0–149.0)
VLDL: 33.2 mg/dL (ref 0.0–40.0)

## 2020-10-13 LAB — COMPREHENSIVE METABOLIC PANEL
ALT: 35 U/L (ref 0–53)
AST: 29 U/L (ref 0–37)
Albumin: 4.5 g/dL (ref 3.5–5.2)
Alkaline Phosphatase: 59 U/L (ref 39–117)
BUN: 24 mg/dL — ABNORMAL HIGH (ref 6–23)
CO2: 31 mEq/L (ref 19–32)
Calcium: 10.1 mg/dL (ref 8.4–10.5)
Chloride: 94 mEq/L — ABNORMAL LOW (ref 96–112)
Creatinine, Ser: 1.4 mg/dL (ref 0.40–1.50)
GFR: 56.24 mL/min — ABNORMAL LOW (ref >60.00)
Glucose, Bld: 352 mg/dL — ABNORMAL HIGH (ref 70–99)
Potassium: 3.8 mEq/L (ref 3.5–5.1)
Sodium: 133 mEq/L — ABNORMAL LOW (ref 135–145)
Total Bilirubin: 0.9 mg/dL (ref 0.2–1.2)
Total Protein: 7.4 g/dL (ref 6.0–8.3)

## 2020-10-13 MED ORDER — HYDRALAZINE HCL 25 MG PO TABS: 25.0000 mg | ORAL_TABLET | Freq: Three times a day (TID) | ORAL | 3 refills | Status: DC

## 2020-10-13 NOTE — Telephone Encounter (Signed)
Pt is returning call to [facility] in regards to his meds. Please advise pt further

## 2020-10-13 NOTE — Telephone Encounter (Signed)
Called patient, advised of message below.   Medications sent to pharmacy.

## 2020-10-16 ENCOUNTER — Other Ambulatory Visit: Payer: Self-pay

## 2020-10-16 DIAGNOSIS — Z0000 Encounter for general adult medical examination without abnormal findings: Secondary | ICD-10-CM

## 2020-10-16 DIAGNOSIS — Z79899 Other long term (current) drug therapy: Secondary | ICD-10-CM

## 2020-10-16 DIAGNOSIS — Z5181 Encounter for therapeutic drug level monitoring: Secondary | ICD-10-CM

## 2020-10-16 DIAGNOSIS — I10 Essential (primary) hypertension: Secondary | ICD-10-CM

## 2020-10-16 NOTE — Telephone Encounter (Signed)
Deberah Pelton, NP  Galvin Proffer, LPN Sharyn Lull,   He was just prescribed hydralazine 25 mg 3 times daily.  Please confirm that he has picked up the medication and is taking it appropriately.  Thank you.   JC  --- From: Avelino Leeds  Sent: 10/10/2020   9:23 AM EDT  To: Deberah Pelton, NP, Waylan Rocher, LPN  Subject: Kevin Jarvis - elevated BP                           Hi [name],   This patient had elevated readings this past weekend. 170/110 and 183/113 (7/10). Readings beforehand were also elevated.   Thanks,  Amy   Called pharmacy-she states that pt did pick up hydralazine on 10-13-2020.  Called pt he states that he went to the pharmacy on Friday 10-13-20 and he states that they did not have the hydralazine in stock. He just picked up the hydralazine and he will start today.

## 2020-10-17 ENCOUNTER — Other Ambulatory Visit: Payer: Self-pay

## 2020-10-17 ENCOUNTER — Other Ambulatory Visit (INDEPENDENT_AMBULATORY_CARE_PROVIDER_SITE_OTHER): Payer: BC Managed Care – PPO

## 2020-10-17 DIAGNOSIS — Z79899 Other long term (current) drug therapy: Secondary | ICD-10-CM

## 2020-10-17 DIAGNOSIS — Z0000 Encounter for general adult medical examination without abnormal findings: Secondary | ICD-10-CM

## 2020-10-17 DIAGNOSIS — I10 Essential (primary) hypertension: Secondary | ICD-10-CM

## 2020-10-17 DIAGNOSIS — Z5181 Encounter for therapeutic drug level monitoring: Secondary | ICD-10-CM

## 2020-10-17 DIAGNOSIS — E119 Type 2 diabetes mellitus without complications: Secondary | ICD-10-CM | POA: Diagnosis not present

## 2020-10-17 LAB — HEMOGLOBIN A1C: Hgb A1c MFr Bld: 16.3 % — ABNORMAL HIGH (ref 4.6–6.5)

## 2020-10-17 LAB — BASIC METABOLIC PANEL
BUN: 23 mg/dL (ref 6–23)
CO2: 29 mEq/L (ref 19–32)
Calcium: 9.7 mg/dL (ref 8.4–10.5)
Chloride: 95 mEq/L — ABNORMAL LOW (ref 96–112)
Creatinine, Ser: 1.19 mg/dL (ref 0.40–1.50)
GFR: 68.35 mL/min (ref >60.00)
Glucose, Bld: 350 mg/dL — ABNORMAL HIGH (ref 70–99)
Potassium: 3.9 mEq/L (ref 3.5–5.1)
Sodium: 132 mEq/L — ABNORMAL LOW (ref 135–145)

## 2020-10-17 NOTE — Addendum Note (Signed)
Addended by: Eddie North C on: 10/17/2020 10:02 AM   Modules accepted: Orders

## 2020-10-18 ENCOUNTER — Ambulatory Visit: Payer: BC Managed Care – PPO | Admitting: Family Medicine

## 2020-10-23 NOTE — Progress Notes (Signed)
Tried calling patient and received a message that the wireless customer is not available.

## 2020-10-25 ENCOUNTER — Telehealth (HOSPITAL_BASED_OUTPATIENT_CLINIC_OR_DEPARTMENT_OTHER): Payer: Self-pay

## 2020-10-25 ENCOUNTER — Ambulatory Visit: Payer: BC Managed Care – PPO

## 2020-10-25 DIAGNOSIS — Z0000 Encounter for general adult medical examination without abnormal findings: Secondary | ICD-10-CM

## 2020-10-25 NOTE — Telephone Encounter (Signed)
Called patient to discuss health coaching to increase physical activity per his response to pathway question in [place]. Left patient a message to return call to Care Guide at [phone].

## 2020-10-25 NOTE — Progress Notes (Deleted)
Patient ID: Kevin Jarvis                 DOB: 1965/07/06                      MRN: [id number]     HPI: Kevin Jarvis is a 55 y.o. male referred by Dr. Oval Linsey to HTN clinic. PMH is significant for newly diagnosed T2DM (A1c 16.3), HTN, and anxiety.  Recently started on insulin.  Current HTN meds: amlodipine 44m daily, hydralazine 274mTID, valsartan 32027mreviously tried:  BP goal:   Family History:   Social History:   Diet:   Exercise:   Home BP readings:  7/23 162/97 HR 89  7/24 156/97 no HR   Wt Readings from Last 3 Encounters:  10/12/20 199 lb 12.8 oz (90.6 kg)  10/06/20 197 lb 12.8 oz (89.7 kg)  08/22/20 215 lb 9.6 oz (97.8 kg)   BP Readings from Last 3 Encounters:  10/12/20 132/70  10/06/20 138/76  08/22/20 (!) 172/94   Pulse Readings from Last 3 Encounters:  10/12/20 94  10/06/20 75  08/22/20 85    Renal function: Estimated Creatinine Clearance: 77.2 mL/min (by C-G formula based on SCr of 1.19 mg/dL).  Past Medical History:  Diagnosis Date   Anxiety    Arthritis    Hypertension     Current Outpatient Medications on File Prior to Visit  Medication Sig Dispense Refill   amLODipine (NORVASC) 10 MG tablet Take 1 tablet (10 mg total) by mouth daily. 90 tablet 1   blood glucose meter kit and supplies Dispense based on patient and insurance preference. Use 2 times daily - fasting or 2 hours after meals. 1 each 0   chlorthalidone (HYGROTON) 25 MG tablet Take 1 tablet (25 mg total) by mouth daily. 90 tablet 3   hydrALAZINE (APRESOLINE) 25 MG tablet Take 1 tablet (25 mg total) by mouth 3 (three) times daily. 270 tablet 3   insulin glargine (LANTUS SOLOSTAR) 100 UNIT/ML Solostar Pen Inject 10 Units into the skin daily. 15 mL 1   Insulin Pen Needle (PEN NEEDLES) 32G X 4 MM MISC Use with solostar pen daily. 100 each 3   metFORMIN (GLUCOPHAGE) 500 MG tablet Take 1 tablet (500 mg total) by mouth 2 (two) times daily with a meal. Start QD for first few days, increase  to BID if tolerated. 180 tablet 3   spironolactone (ALDACTONE) 25 MG tablet Take 1 tablet (25 mg total) by mouth daily. 90 tablet 3   valsartan (DIOVAN) 320 MG tablet Take 1 tablet (320 mg total) by mouth daily. 30 tablet 6   No current facility-administered medications on file prior to visit.    Allergies  Allergen Reactions   Chlorthalidone Other (See Comments)    lightheaded     Assessment/Plan:  1. Hypertension -

## 2020-10-26 ENCOUNTER — Other Ambulatory Visit: Payer: Self-pay

## 2020-10-26 ENCOUNTER — Telehealth: Payer: Self-pay

## 2020-10-26 ENCOUNTER — Ambulatory Visit (INDEPENDENT_AMBULATORY_CARE_PROVIDER_SITE_OTHER): Payer: BC Managed Care – PPO | Admitting: Family Medicine

## 2020-10-26 VITALS — BP 132/66 | HR 87 | Temp 98.1°F | Resp 16 | Ht 69.0 in | Wt 202.4 lb

## 2020-10-26 DIAGNOSIS — E119 Type 2 diabetes mellitus without complications: Secondary | ICD-10-CM | POA: Diagnosis not present

## 2020-10-26 DIAGNOSIS — H538 Other visual disturbances: Secondary | ICD-10-CM | POA: Diagnosis not present

## 2020-10-26 DIAGNOSIS — E1165 Type 2 diabetes mellitus with hyperglycemia: Secondary | ICD-10-CM

## 2020-10-26 DIAGNOSIS — Z794 Long term (current) use of insulin: Secondary | ICD-10-CM | POA: Diagnosis not present

## 2020-10-26 LAB — GLUCOSE, POCT (MANUAL RESULT ENTRY): POC Glucose: 190 mg/dl — AB (ref 70–99)

## 2020-10-26 NOTE — Telephone Encounter (Signed)
-----   Message from Deberah Pelton, NP sent at 10/26/2020  7:06 AM EDT ----- Regarding: RE: Oralia Manis please contact Mr. Streich to see if he has been taking his new prescription.  Also remind him that he needs to check his blood pressure about an hour after he takes his medication.  Please review diet and secondary causes of hypertension.  Thank you.  Jossie Ng. Cleaver NP-C 10/26/2020, 7:08 AM [facility] [address] Office [phone] Fax [phone]   ----- Message ----- From: Avelino Leeds Sent: 10/25/2020   4:04 PM EDT To: Deberah Pelton, NP, Waylan Rocher, LPN Subject: Delfino Lovett and Sharyn Lull,  This patient's reading today was 161/99 and over the weekend 162/97 and 156/97 consecutively.  Thanks, Amy

## 2020-10-26 NOTE — Progress Notes (Signed)
Subjective:  Patient ID: Kevin Jarvis, male    DOB: 12-14-1965  Age: 56 y.o. MRN: [id number]  CC:  Chief Complaint  Patient presents with   Diabetes    Pt here for a 2 week recheck DM reports glucose under 200, readings 310, 350, 260, 230, 210, 122 and in office today 190 pt reprts had melon for breakfast     HPI Kevin Jarvis presents for    Diabetes: New onset. See prior visits. Last visit 2 weeks ago. Improving but still elevated levels at that time. Increased to 14 units of lantus. Still on metformin 528m BID, no new side effects.  No symptomatic lows -lowest reading 122 (after work, last eaten about 7 hours prior. Other readings 200's as above.  Feels good.  Still blurry vision - both sides - appt with optho not until October. Vision affecting his ability to work.  Watermelon this am.  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 Allergies  Allergen Reactions   Chlorthalidone Other (See Comments)    lightheaded   Prior to Admission medications   Medication Sig Start Date End Date Taking? Authorizing Provider  amLODipine (NORVASC) 10 MG tablet Take 1 tablet (10 mg total) by mouth daily. 10/12/20  Yes GWendie Agreste MD  blood glucose meter kit  and supplies Dispense based on patient and insurance preference. Use 2 times daily - fasting or 2 hours after meals. 10/06/20  Yes GWendie Agreste MD  chlorthalidone (HYGROTON) 25 MG tablet Take 1 tablet (25 mg total) by mouth daily. 08/22/20 11/20/20 Yes RSkeet Latch MD  hydrALAZINE (APRESOLINE) 25 MG tablet Take 1 tablet (25 mg total) by mouth 3 (three) times daily. 10/13/20 01/11/21 Yes RSkeet Latch MD  insulin glargine (LANTUS SOLOSTAR) 100 UNIT/ML Solostar Pen Inject 10 Units into the skin daily. 10/06/20  Yes GWendie Agreste MD  Insulin Pen Needle (PEN NEEDLES) 32G X 4 MM MISC Use with solostar pen daily. 10/06/20  Yes GWendie Agreste MD  metFORMIN (GLUCOPHAGE) 500 MG tablet Take 1 tablet (500 mg total) by mouth 2 (two) times daily with a meal. Start QD for first few days, increase to BID if tolerated. 10/06/20  Yes GWendie Agreste MD  valsartan (DIOVAN) 320 MG tablet Take 1 tablet (320 mg total) by mouth daily. 09/22/20  Yes CDeberah Pelton NP  spironolactone (ALDACTONE) 25 MG tablet Take 1 tablet (25 mg total) by mouth daily. 03/06/20 06/04/20  O'Neal,Cassie Freer MD   Social History   Socioeconomic History   Marital status: Married    Spouse name: Not on file   Number of children: 2  Years of education: Not on file   Highest education level: Not on file  Occupational History   Occupation: Glass blower/designer  Tobacco Use   Smoking status: Never   Smokeless tobacco: Never  Substance and Sexual Activity   Alcohol use: Yes   Drug use: No   Sexual activity: Not on file  Other Topics Concern   Not on file  Social History Narrative   From [place] - Moved to [place] 2000   Lives with wife and son   Social Determinants of Radio broadcast assistant Strain: Not on file  Food Insecurity: Not on file  Transportation Needs: Not on file  Physical Activity: Not on file  Stress: Not on file  Social Connections: Not on file  Intimate Partner Violence: Not on file    Review  of Systems  Per HPI. No n/v/abd pain or confusion.  Objective:   Vitals:   10/26/20 1047  BP: 132/66  Pulse: 87  Resp: 16  Temp: 98.1 F (36.7 C)  TempSrc: Temporal  SpO2: 97%  Weight: 202 lb 6.4 oz (91.8 kg)  Height: _0  (1.753 m)     Physical Exam Vitals reviewed.  Constitutional:      Appearance: He is well-developed.  HENT:     Head: Normocephalic and atraumatic.  Eyes:     Extraocular Movements: Extraocular movements intact.     Pupils: Pupils are equal, round, and reactive to light.  Neck:     Vascular: No carotid bruit or JVD.  Cardiovascular:     Rate and Rhythm: Normal rate and regular rhythm.     Heart sounds: Normal heart sounds. No murmur heard. Pulmonary:     Effort: Pulmonary effort is normal.     Breath sounds: Normal breath sounds. No rales.  Musculoskeletal:     Right lower leg: No edema.     Left lower leg: No edema.  Skin:    General: Skin is warm and dry.  Neurological:     Mental Status: He is alert and oriented to person, place, and time.  Psychiatric:        Mood and Affect: Mood normal.       Assessment & Plan:  Kevin Jarvis is a 55 y.o. male . Diabetes mellitus, new onset ([place]) - Plan: POCT glucose (manual entry), Ambulatory referral to Ophthalmology, Ambulatory referral to diabetic education  -Improving.  Will increase Lantus to further units based on overall control.  Healthy snack may be needed late in the afternoon to lessen risk of hypoglycemia, recommended carrying some form of sugar with him and signs or symptoms of hypoglycemia discussed.  -Handout given on diabetes and nutrition, refer to diabetes and nutrition management center for classes.  Blurry vision, bilateral - Plan: Ambulatory referral to Ophthalmology  -Initially suspected hyperglycemia cause, but has had persistent blurred vision with improving glucose.  Affecting his ability to read small print.  Will refer to ophthalmology to see if he can be seen  sooner.  No orders of the defined types were placed in this encounter.  Patient Instructions  Increase lantus to 16 units. Do not skip meals and healthy snack in afternoon may be needed to keep from running low. Carry form of sugar with you at all times in case you run low. Let me know if that occurs.  Recheck in 1 month - sooner if any new/worsening symptoms or levels running higher.   Type 2 Diabetes Mellitus, Self-Care, Adult Caring for yourself after you have  been diagnosed with type 2 diabetes (type 2 diabetes mellitus) means keeping your blood sugar (glucose) under control with a balance of: Nutrition. Exercise. Lifestyle changes. Medicines or insulin, if needed. Support from your team of health care providers and others. The following information explains what you need to know to manage yourdiabetes at home. What are the risks? Having diabetes can put you at risk for other long-term (chronic) conditions, such as heart disease and kidney disease. Your health careprovider may prescribe medicines to help prevent complications from diabetes. How to monitor blood glucose  Check your blood glucose every day, as often as told by your health care provider. Have your A1C (hemoglobin A1C) level checked two or more times a year, or as often as told by your health care provider. Your health care provider will set personalized treatment goals for you. Generally, the goal of treatment is to maintain the following blood glucose levels: Before meals: 80-130 mg/dL (4.4-7.2 mmol/L). After meals: below 180 mg/dL (10 mmol/L). A1C level: less than 7%. How to manage hyperglycemia and hypoglycemia Hyperglycemia symptoms Hyperglycemia, also called high blood glucose, occurs when blood glucose is too high. Make sure you know the early signs of hyperglycemia, such as: Increased thirst. Hunger. Feeling very tired. Needing to urinate more often than usual. Blurry vision. Hypoglycemia  symptoms Hypoglycemia, also called low blood glucose, occurs with a blood glucose level at or below 70 mg/dL (3.9 mmol/L). Diabetes medicines lower your blood glucose and can cause hypoglycemia. The risk for hypoglycemia increases during or after exercise, during sleep, during illness, and when skipping meals or not eating for a long time (fasting). It is important to know the symptoms of hypoglycemia and treat it right away. Always have a 15-gram rapid-acting carbohydrate snack with you to treat low blood glucose. Family members and close friends should also know the symptoms and understand how to treat hypoglycemia, in case you are not able to treat yourself. Symptoms may include: Hunger. Anxiety. Sweating and feeling clammy. Dizziness or feeling light-headed. Sleepiness. Increased heart rate. Irritability. Tingling or numbness around the mouth, lips, or tongue. Restless sleep. Severe hypoglycemia is when your blood glucose level is at or below 54 mg/dL (3 mmol/L). Severe hypoglycemia is an emergency. Do not wait to see if the symptoms will go away. Get medical help right away. Call your local emergency services (911 in the [place] Do not drive yourself to the hospital. If you have severe hypoglycemia and you cannot eat or drink, you may need glucagon. A family member or close friend should learn how to check your blood glucose and how to give you glucagon. Ask your health care provider if you needto have an emergency glucagon kit available. Follow these instructions at home: Medicines Take diabetes medicines as told by your health care provider. If your health care provider prescribed insulin or diabetes medicines, take them every day. Do not run out of insulin or other diabetes medicines. Plan ahead so you always have these available. If you use insulin, adjust your dosage based on your physical activity and what foods you eat. Your health care provider will tell you how to adjust your  dosage. Take over-the-counter and prescription medicines only as told by your health care provider. Eating and drinking  What you eat and drink affects your blood glucose and your insulin dosage. Making good choices helps to control your diabetes and prevent other health problems. A healthy meal plan includes eating lean proteins, complex carbohydrates, fresh fruits and vegetables, low-fat  dairy products, and healthyfats. Make an appointment to see a registered dietitian to help you create an eating plan that is right for you. Make sure that you: Follow instructions from your health care provider about eating or drinking restrictions. Drink enough fluid to keep your urine pale yellow. Keep a record of the carbohydrates that you eat. Do this by reading food labels and learning the standard serving sizes of foods. Follow your sick-day plan whenever you cannot eat or drink as usual. Make this plan in advance with your health care provider.  Activity Stay active. Exercise regularly, as told by your health care provider. This may include: Stretching and doing strength exercises, such as yoga or weight lifting, 2 or more times a week. Doing 150 minutes or more of moderate-intensity or vigorous-intensity exercise each week. This could be brisk walking, biking, or water aerobics. Spread out your activity over 3 or more days of the week. Do not go more than 2 days in a row without doing some kind of physical activity. When you start a new exercise or activity, work with your health care provider to adjust your insulin, medicines, or food intake as needed. Lifestyle Do not use any products that contain nicotine or tobacco, such as cigarettes, e-cigarettes, and chewing tobacco. If you need help quitting, ask your health care provider. If your health care provider says that alcohol is safe for you, limit how much you use to no more than 1 drink a day for women who are not pregnant and 2 drinks a day for men.  In the [place] one drink equals one 12 oz bottle of beer (355 mL), one 5 oz glass of wine (148 mL), or one 1 oz glass of hard liquor (44 mL). Learn to manage stress. If you need help with this, ask your health care provider. Take care of your body  Keep your immunizations up to date. In addition to getting vaccinations as told by your health care provider, it is recommended that you get vaccinated against the following illnesses: The flu (influenza). Get a flu shot every year. Pneumonia. Hepatitis B. Schedule an eye exam soon after your diagnosis, and then one time every year after that. Check your skin and feet every day for cuts, bruises, redness, blisters, or sores. Schedule a foot exam with your health care provider once every year. Brush your teeth and gums two times a day, and floss one or more times a day. Visit your dentist one or more times every 6 months. Maintain a healthy weight.  General instructions Share your diabetes management plan with people in your workplace, school, and household. Carry a medical alert card or wear medical alert jewelry. Keep all follow-up visits as told by your health care provider. This is important. Questions to ask your health care provider Should I meet with a certified diabetes care and education specialist? Where can I find a support group for people with diabetes? Where to find more information American Diabetes Association (ADA): www.diabetes.org American Association of Diabetes Care and Education Specialists (ADCES): www.diabeteseducator.org International Diabetes Federation (IDF): MemberVerification.ca Summary Caring for yourself after you have been diagnosed with type 2 diabetes (type 2 diabetes mellitus) means keeping your blood sugar (glucose) under control with a balance of nutrition, exercise, lifestyle changes, and medicine. Check your blood glucose every day, as often as told by your health care provider. Having diabetes can put you at risk for  other long-term (chronic) conditions, such as heart disease and kidney disease. Your  health care provider may prescribe medicines to help prevent complications from diabetes. Share your diabetes management plan with people in your workplace, school, and household. Keep all follow-up visits as told by your health care provider. This is important. This information is not intended to replace advice given to you by your health care provider. Make sure you discuss any questions you have with your healthcare provider. Document Revised: 04/26/2019 Document Reviewed: 04/27/2019 Elsevier Patient Education  2021 [place].   Diabetes Mellitus and Nutrition, Adult When you have diabetes, or diabetes mellitus, it is very important to have healthy eating habits because your blood sugar (glucose) levels are greatly affected by what you eat and drink. Eating healthy foods in the right amounts, at about the same times every day, can help you: Control your blood glucose. Lower your risk of heart disease. Improve your blood pressure. Reach or maintain a healthy weight. What can affect my meal plan? Every person with diabetes is different, and each person has different needs for a meal plan. Your health care provider may recommend that you work with a dietitian to make a meal plan that is best for you. Your meal plan may vary depending on factors such as: The calories you need. The medicines you take. Your weight. Your blood glucose, blood pressure, and cholesterol levels. Your activity level. Other health conditions you have, such as heart or kidney disease. How do carbohydrates affect me? Carbohydrates, also called carbs, affect your blood glucose level more than any other type of food. Eating carbs naturally raises the amount of glucose in your blood. Carb counting is a method for keeping track of how many carbs you eat. Counting carbs is important to keep your blood glucose at a healthy level,especially  if you use insulin or take certain oral diabetes medicines. It is important to know how many carbs you can safely have in each meal. This is different for every person. Your dietitian can help you calculate how manycarbs you should have at each meal and for each snack. How does alcohol affect me? Alcohol can cause a sudden decrease in blood glucose (hypoglycemia), especially if you use insulin or take certain oral diabetes medicines. Hypoglycemia can be a life-threatening condition. Symptoms of hypoglycemia, such as sleepiness, dizziness, and confusion, are similar to symptoms of having too much alcohol. Do not drink alcohol if: Your health care provider tells you not to drink. You are pregnant, may be pregnant, or are planning to become pregnant. If you drink alcohol: Do not drink on an empty stomach. Limit how much you use to: 0-1 drink a day for women. 0-2 drinks a day for men. Be aware of how much alcohol is in your drink. In the [place] one drink equals one 12 oz bottle of beer (355 mL), one 5 oz glass of wine (148 mL), or one 1 oz glass of hard liquor (44 mL). Keep yourself hydrated with water, diet soda, or unsweetened iced tea. Keep in mind that regular soda, juice, and other mixers may contain a lot of sugar and must be counted as carbs. What are tips for following this plan?  Reading food labels Start by checking the serving size on the "Nutrition Facts" label of packaged foods and drinks. The amount of calories, carbs, fats, and other nutrients listed on the label is based on one serving of the item. Many items contain more than one serving per package. Check the total grams (g) of carbs in one serving. You can calculate  the number of servings of carbs in one serving by dividing the total carbs by 15. For example, if a food has 30 g of total carbs per serving, it would be equal to 2 servings of carbs. Check the number of grams (g) of saturated fats and trans fats in one serving. Choose  foods that have a low amount or none of these fats. Check the number of milligrams (mg) of salt (sodium) in one serving. Most people should limit total sodium intake to less than 2,300 mg per day. Always check the nutrition information of foods labeled as "low-fat" or "nonfat." These foods may be higher in added sugar or refined carbs and should be avoided. Talk to your dietitian to identify your daily goals for nutrients listed on the label. Shopping Avoid buying canned, pre-made, or processed foods. These foods tend to be high in fat, sodium, and added sugar. Shop around the outside edge of the grocery store. This is where you will most often find fresh fruits and vegetables, bulk grains, fresh meats, and fresh dairy. Cooking Use low-heat cooking methods, such as baking, instead of high-heat cooking methods like deep frying. Cook using healthy oils, such as olive, canola, or sunflower oil. Avoid cooking with butter, cream, or high-fat meats. Meal planning Eat meals and snacks regularly, preferably at the same times every day. Avoid going long periods of time without eating. Eat foods that are high in fiber, such as fresh fruits, vegetables, beans, and whole grains. Talk with your dietitian about how many servings of carbs you can eat at each meal. Eat 4-6 oz (112-168 g) of lean protein each day, such as lean meat, chicken, fish, eggs, or tofu. One ounce (oz) of lean protein is equal to: 1 oz (28 g) of meat, chicken, or fish. 1 egg.  cup (62 g) of tofu. Eat some foods each day that contain healthy fats, such as avocado, nuts, seeds, and fish. What foods should I eat? Fruits Berries. Apples. Oranges. Peaches. Apricots. Plums. Grapes. Mango. Papaya.Pomegranate. Kiwi. Cherries. Vegetables Lettuce. Spinach. Leafy greens, including kale, chard, collard greens, and mustard greens. Beets. Cauliflower. Cabbage. Broccoli. Carrots. Green beans.Tomatoes. Peppers. Onions. Cucumbers. Brussels  sprouts. Grains Whole grains, such as whole-wheat or whole-grain bread, crackers, tortillas,cereal, and pasta. Unsweetened oatmeal. Quinoa. Brown or wild rice. Meats and other proteins Seafood. Poultry without skin. Lean cuts of poultry and beef. Tofu. Nuts. Seeds. Dairy Low-fat or fat-free dairy products such as milk, yogurt, and cheese. The items listed above may not be a complete list of foods and beverages you can eat. Contact a dietitian for more information. What foods should I avoid? Fruits Fruits canned with syrup. Vegetables Canned vegetables. Frozen vegetables with butter or cream sauce. Grains Refined white flour and flour products such as bread, pasta, snack foods, andcereals. Avoid all processed foods. Meats and other proteins Fatty cuts of meat. Poultry with skin. Breaded or fried meats. Processed meat.Avoid saturated fats. Dairy Full-fat yogurt, cheese, or milk. Beverages Sweetened drinks, such as soda or iced tea. The items listed above may not be a complete list of foods and beverages you should avoid. Contact a dietitian for more information. Questions to ask a health care provider Do I need to meet with a diabetes educator? Do I need to meet with a dietitian? What number can I call if I have questions? When are the best times to check my blood glucose? Where to find more information: American Diabetes Association: diabetes.org Academy of Nutrition and Dietetics: www.eatright.org  [employer] of Diabetes and Digestive and Kidney Diseases: [place] Association of Diabetes Care and Education Specialists: www.diabeteseducator.org Summary It is important to have healthy eating habits because your blood sugar (glucose) levels are greatly affected by what you eat and drink. A healthy meal plan will help you control your blood glucose and maintain a healthy lifestyle. Your health care provider may recommend that you work with a dietitian to make a meal  plan that is best for you. Keep in mind that carbohydrates (carbs) and alcohol have immediate effects on your blood glucose levels. It is important to count carbs and to use alcohol carefully. This information is not intended to replace advice given to you by your health care provider. Make sure you discuss any questions you have with your healthcare provider. Document Revised: 02/23/2019 Document Reviewed: 02/23/2019 Elsevier Patient Education  2021 [place],   Merri Ray, MD [place], [facility] 10/26/20 11:26 AM

## 2020-10-26 NOTE — Telephone Encounter (Signed)
Tried to call pt, phone says"try your call again later"

## 2020-10-26 NOTE — Patient Instructions (Addendum)
Increase lantus to 16 units. Do not skip meals and healthy snack in afternoon may be needed to keep from running low. Carry form of sugar with you at all times in case you run low. Let me know if that occurs.  Recheck in 1 month - sooner if any new/worsening symptoms or levels running higher.   Type 2 Diabetes Mellitus, Self-Care, Adult Caring for yourself after you have been diagnosed with type 2 diabetes (type 2 diabetes mellitus) means keeping your blood sugar (glucose) under control with a balance of: Nutrition. Exercise. Lifestyle changes. Medicines or insulin, if needed. Support from your team of health care providers and others. The following information explains what you need to know to manage yourdiabetes at home. What are the risks? Having diabetes can put you at risk for other long-term (chronic) conditions, such as heart disease and kidney disease. Your health careprovider may prescribe medicines to help prevent complications from diabetes. How to monitor blood glucose  Check your blood glucose every day, as often as told by your health care provider. Have your A1C (hemoglobin A1C) level checked two or more times a year, or as often as told by your health care provider. Your health care provider will set personalized treatment goals for you. Generally, the goal of treatment is to maintain the following blood glucose levels: Before meals: 80-130 mg/dL (4.4-7.2 mmol/L). After meals: below 180 mg/dL (10 mmol/L). A1C level: less than 7%. How to manage hyperglycemia and hypoglycemia Hyperglycemia symptoms Hyperglycemia, also called high blood glucose, occurs when blood glucose is too high. Make sure you know the early signs of hyperglycemia, such as: Increased thirst. Hunger. Feeling very tired. Needing to urinate more often than usual. Blurry vision. Hypoglycemia symptoms Hypoglycemia, also called low blood glucose, occurs with a blood glucose level at or below 70 mg/dL (3.9  mmol/L). Diabetes medicines lower your blood glucose and can cause hypoglycemia. The risk for hypoglycemia increases during or after exercise, during sleep, during illness, and when skipping meals or not eating for a long time (fasting). It is important to know the symptoms of hypoglycemia and treat it right away. Always have a 15-gram rapid-acting carbohydrate snack with you to treat low blood glucose. Family members and close friends should also know the symptoms and understand how to treat hypoglycemia, in case you are not able to treat yourself. Symptoms may include: Hunger. Anxiety. Sweating and feeling clammy. Dizziness or feeling light-headed. Sleepiness. Increased heart rate. Irritability. Tingling or numbness around the mouth, lips, or tongue. Restless sleep. Severe hypoglycemia is when your blood glucose level is at or below 54 mg/dL (3 mmol/L). Severe hypoglycemia is an emergency. Do not wait to see if the symptoms will go away. Get medical help right away. Call your local emergency services (911 in the [place] Do not drive yourself to the hospital. If you have severe hypoglycemia and you cannot eat or drink, you may need glucagon. A family member or close friend should learn how to check your blood glucose and how to give you glucagon. Ask your health care provider if you needto have an emergency glucagon kit available. Follow these instructions at home: Medicines Take diabetes medicines as told by your health care provider. If your health care provider prescribed insulin or diabetes medicines, take them every day. Do not run out of insulin or other diabetes medicines. Plan ahead so you always have these available. If you use insulin, adjust your dosage based on your physical activity and what foods you eat.  Your health care provider will tell you how to adjust your dosage. Take over-the-counter and prescription medicines only as told by your health care provider. Eating and  drinking  What you eat and drink affects your blood glucose and your insulin dosage. Making good choices helps to control your diabetes and prevent other health problems. A healthy meal plan includes eating lean proteins, complex carbohydrates, fresh fruits and vegetables, low-fat dairy products, and healthyfats. Make an appointment to see a registered dietitian to help you create an eating plan that is right for you. Make sure that you: Follow instructions from your health care provider about eating or drinking restrictions. Drink enough fluid to keep your urine pale yellow. Keep a record of the carbohydrates that you eat. Do this by reading food labels and learning the standard serving sizes of foods. Follow your sick-day plan whenever you cannot eat or drink as usual. Make this plan in advance with your health care provider.  Activity Stay active. Exercise regularly, as told by your health care provider. This may include: Stretching and doing strength exercises, such as yoga or weight lifting, 2 or more times a week. Doing 150 minutes or more of moderate-intensity or vigorous-intensity exercise each week. This could be brisk walking, biking, or water aerobics. Spread out your activity over 3 or more days of the week. Do not go more than 2 days in a row without doing some kind of physical activity. When you start a new exercise or activity, work with your health care provider to adjust your insulin, medicines, or food intake as needed. Lifestyle Do not use any products that contain nicotine or tobacco, such as cigarettes, e-cigarettes, and chewing tobacco. If you need help quitting, ask your health care provider. If your health care provider says that alcohol is safe for you, limit how much you use to no more than 1 drink a day for women who are not pregnant and 2 drinks a day for men. In the [place] one drink equals one 12 oz bottle of beer (355 mL), one 5 oz glass of wine (148 mL), or one 1 oz  glass of hard liquor (44 mL). Learn to manage stress. If you need help with this, ask your health care provider. Take care of your body  Keep your immunizations up to date. In addition to getting vaccinations as told by your health care provider, it is recommended that you get vaccinated against the following illnesses: The flu (influenza). Get a flu shot every year. Pneumonia. Hepatitis B. Schedule an eye exam soon after your diagnosis, and then one time every year after that. Check your skin and feet every day for cuts, bruises, redness, blisters, or sores. Schedule a foot exam with your health care provider once every year. Brush your teeth and gums two times a day, and floss one or more times a day. Visit your dentist one or more times every 6 months. Maintain a healthy weight.  General instructions Share your diabetes management plan with people in your workplace, school, and household. Carry a medical alert card or wear medical alert jewelry. Keep all follow-up visits as told by your health care provider. This is important. Questions to ask your health care provider Should I meet with a certified diabetes care and education specialist? Where can I find a support group for people with diabetes? Where to find more information American Diabetes Association (ADA): www.diabetes.org American Association of Diabetes Care and Education Specialists (ADCES): www.diabeteseducator.org International Diabetes Federation (IDF): MemberVerification.ca  Summary Caring for yourself after you have been diagnosed with type 2 diabetes (type 2 diabetes mellitus) means keeping your blood sugar (glucose) under control with a balance of nutrition, exercise, lifestyle changes, and medicine. Check your blood glucose every day, as often as told by your health care provider. Having diabetes can put you at risk for other long-term (chronic) conditions, such as heart disease and kidney disease. Your health care provider may  prescribe medicines to help prevent complications from diabetes. Share your diabetes management plan with people in your workplace, school, and household. Keep all follow-up visits as told by your health care provider. This is important. This information is not intended to replace advice given to you by your health care provider. Make sure you discuss any questions you have with your healthcare provider. Document Revised: 04/26/2019 Document Reviewed: 04/27/2019 Elsevier Patient Education  2021 [place].   Diabetes Mellitus and Nutrition, Adult When you have diabetes, or diabetes mellitus, it is very important to have healthy eating habits because your blood sugar (glucose) levels are greatly affected by what you eat and drink. Eating healthy foods in the right amounts, at about the same times every day, can help you: Control your blood glucose. Lower your risk of heart disease. Improve your blood pressure. Reach or maintain a healthy weight. What can affect my meal plan? Every person with diabetes is different, and each person has different needs for a meal plan. Your health care provider may recommend that you work with a dietitian to make a meal plan that is best for you. Your meal plan may vary depending on factors such as: The calories you need. The medicines you take. Your weight. Your blood glucose, blood pressure, and cholesterol levels. Your activity level. Other health conditions you have, such as heart or kidney disease. How do carbohydrates affect me? Carbohydrates, also called carbs, affect your blood glucose level more than any other type of food. Eating carbs naturally raises the amount of glucose in your blood. Carb counting is a method for keeping track of how many carbs you eat. Counting carbs is important to keep your blood glucose at a healthy level,especially if you use insulin or take certain oral diabetes medicines. It is important to know how many carbs you can  safely have in each meal. This is different for every person. Your dietitian can help you calculate how manycarbs you should have at each meal and for each snack. How does alcohol affect me? Alcohol can cause a sudden decrease in blood glucose (hypoglycemia), especially if you use insulin or take certain oral diabetes medicines. Hypoglycemia can be a life-threatening condition. Symptoms of hypoglycemia, such as sleepiness, dizziness, and confusion, are similar to symptoms of having too much alcohol. Do not drink alcohol if: Your health care provider tells you not to drink. You are pregnant, may be pregnant, or are planning to become pregnant. If you drink alcohol: Do not drink on an empty stomach. Limit how much you use to: 0-1 drink a day for women. 0-2 drinks a day for men. Be aware of how much alcohol is in your drink. In the [place] one drink equals one 12 oz bottle of beer (355 mL), one 5 oz glass of wine (148 mL), or one 1 oz glass of hard liquor (44 mL). Keep yourself hydrated with water, diet soda, or unsweetened iced tea. Keep in mind that regular soda, juice, and other mixers may contain a lot of sugar and must be counted as  carbs. What are tips for following this plan?  Reading food labels Start by checking the serving size on the "Nutrition Facts" label of packaged foods and drinks. The amount of calories, carbs, fats, and other nutrients listed on the label is based on one serving of the item. Many items contain more than one serving per package. Check the total grams (g) of carbs in one serving. You can calculate the number of servings of carbs in one serving by dividing the total carbs by 15. For example, if a food has 30 g of total carbs per serving, it would be equal to 2 servings of carbs. Check the number of grams (g) of saturated fats and trans fats in one serving. Choose foods that have a low amount or none of these fats. Check the number of milligrams (mg) of salt (sodium) in  one serving. Most people should limit total sodium intake to less than 2,300 mg per day. Always check the nutrition information of foods labeled as "low-fat" or "nonfat." These foods may be higher in added sugar or refined carbs and should be avoided. Talk to your dietitian to identify your daily goals for nutrients listed on the label. Shopping Avoid buying canned, pre-made, or processed foods. These foods tend to be high in fat, sodium, and added sugar. Shop around the outside edge of the grocery store. This is where you will most often find fresh fruits and vegetables, bulk grains, fresh meats, and fresh dairy. Cooking Use low-heat cooking methods, such as baking, instead of high-heat cooking methods like deep frying. Cook using healthy oils, such as olive, canola, or sunflower oil. Avoid cooking with butter, cream, or high-fat meats. Meal planning Eat meals and snacks regularly, preferably at the same times every day. Avoid going long periods of time without eating. Eat foods that are high in fiber, such as fresh fruits, vegetables, beans, and whole grains. Talk with your dietitian about how many servings of carbs you can eat at each meal. Eat 4-6 oz (112-168 g) of lean protein each day, such as lean meat, chicken, fish, eggs, or tofu. One ounce (oz) of lean protein is equal to: 1 oz (28 g) of meat, chicken, or fish. 1 egg.  cup (62 g) of tofu. Eat some foods each day that contain healthy fats, such as avocado, nuts, seeds, and fish. What foods should I eat? Fruits Berries. Apples. Oranges. Peaches. Apricots. Plums. Grapes. Mango. Papaya.Pomegranate. Kiwi. Cherries. Vegetables Lettuce. Spinach. Leafy greens, including kale, chard, collard greens, and mustard greens. Beets. Cauliflower. Cabbage. Broccoli. Carrots. Green beans.Tomatoes. Peppers. Onions. Cucumbers. Brussels sprouts. Grains Whole grains, such as whole-wheat or whole-grain bread, crackers, tortillas,cereal, and pasta.  Unsweetened oatmeal. Quinoa. Brown or wild rice. Meats and other proteins Seafood. Poultry without skin. Lean cuts of poultry and beef. Tofu. Nuts. Seeds. Dairy Low-fat or fat-free dairy products such as milk, yogurt, and cheese. The items listed above may not be a complete list of foods and beverages you can eat. Contact a dietitian for more information. What foods should I avoid? Fruits Fruits canned with syrup. Vegetables Canned vegetables. Frozen vegetables with butter or cream sauce. Grains Refined white flour and flour products such as bread, pasta, snack foods, andcereals. Avoid all processed foods. Meats and other proteins Fatty cuts of meat. Poultry with skin. Breaded or fried meats. Processed meat.Avoid saturated fats. Dairy Full-fat yogurt, cheese, or milk. Beverages Sweetened drinks, such as soda or iced tea. The items listed above may not be a complete list  of foods and beverages you should avoid. Contact a dietitian for more information. Questions to ask a health care provider Do I need to meet with a diabetes educator? Do I need to meet with a dietitian? What number can I call if I have questions? When are the best times to check my blood glucose? Where to find more information: American Diabetes Association: diabetes.org Academy of Nutrition and Dietetics: www.eatright.[employer] of Diabetes and Digestive and Kidney Diseases: [employer] Association of Diabetes Care and Education Specialists: www.diabeteseducator.org Summary It is important to have healthy eating habits because your blood sugar (glucose) levels are greatly affected by what you eat and drink. A healthy meal plan will help you control your blood glucose and maintain a healthy lifestyle. Your health care provider may recommend that you work with a dietitian to make a meal plan that is best for you. Keep in mind that carbohydrates (carbs) and alcohol have immediate effects on your blood  glucose levels. It is important to count carbs and to use alcohol carefully. This information is not intended to replace advice given to you by your health care provider. Make sure you discuss any questions you have with your healthcare provider. Document Revised: 02/23/2019 Document Reviewed: 02/23/2019 Elsevier Patient Education  2021 [employer].

## 2020-10-30 ENCOUNTER — Telehealth: Payer: Self-pay

## 2020-10-30 NOTE — Telephone Encounter (Signed)
Left message to call back  

## 2020-10-30 NOTE — Telephone Encounter (Signed)
-----   Message from Rockne Menghini, [place] sent at 10/28/2020 10:46 AM EDT ----- Regarding: [facility] missed appt Could you please call him to re-schedule [facility] appt.   Thank you! K

## 2020-10-30 NOTE — Telephone Encounter (Signed)
LMOM TO R/S MISSED APPT  

## 2020-11-13 NOTE — Telephone Encounter (Signed)
Unable to contact pt will await pt CB

## 2020-11-27 ENCOUNTER — Encounter (INDEPENDENT_AMBULATORY_CARE_PROVIDER_SITE_OTHER): Payer: Self-pay

## 2020-11-27 ENCOUNTER — Ambulatory Visit (INDEPENDENT_AMBULATORY_CARE_PROVIDER_SITE_OTHER): Payer: BC Managed Care – PPO | Admitting: Ophthalmology

## 2020-11-27 ENCOUNTER — Other Ambulatory Visit: Payer: Self-pay

## 2020-11-27 ENCOUNTER — Encounter (INDEPENDENT_AMBULATORY_CARE_PROVIDER_SITE_OTHER): Payer: Self-pay | Admitting: Ophthalmology

## 2020-11-27 DIAGNOSIS — E113293 Type 2 diabetes mellitus with mild nonproliferative diabetic retinopathy without macular edema, bilateral: Secondary | ICD-10-CM

## 2020-11-27 DIAGNOSIS — H3581 Retinal edema: Secondary | ICD-10-CM | POA: Diagnosis not present

## 2020-11-27 DIAGNOSIS — G4726 Circadian rhythm sleep disorder, shift work type: Secondary | ICD-10-CM

## 2020-11-27 DIAGNOSIS — H35033 Hypertensive retinopathy, bilateral: Secondary | ICD-10-CM | POA: Diagnosis not present

## 2020-11-27 NOTE — Assessment & Plan Note (Signed)
See notes under hypertensive retinopathy

## 2020-11-27 NOTE — Progress Notes (Signed)
11/27/2020     CHIEF COMPLAINT Patient presents for  Chief Complaint  Patient presents with   Retina Evaluation      HISTORY OF PRESENT ILLNESS: Kevin Jarvis is a 55 y.o. male who presents to the clinic today for:   HPI     Retina Evaluation   In both eyes.        Comments   New Patient Retinal Evaluation of Newly diagnosed Type 2 Diabetes, referred from Dr. Carlota Raspberry at [facility].  For one month  Pt c/o mildly blurry distance and near vision in both eyes, first noticed ~ 1 month ago along with his diagnoses of Diabetes., currently taking metformin and Lantus. He states the vision has improved slightly. Pt denies any floaters or flashes of light. Pt denies any dark curtain/veil. Pt denies any pain in or around the eye.   A1c: 16.3 (10/17/20) LBS: 150 (this am)      Last edited by Hurman Horn, MD on 11/27/2020  2:45 PM.      Referring physician: Wendie Agreste, MD [id number] [place],  [place] [address]  HISTORICAL INFORMATION:   Selected notes from the MEDICAL RECORD NUMBER    Lab Results  Component Value Date   HGBA1C 16.3 (H) 10/17/2020     CURRENT MEDICATIONS: No current outpatient medications on file. (Ophthalmic Drugs)   No current facility-administered medications for this visit. (Ophthalmic Drugs)   Current Outpatient Medications (Other)  Medication Sig   amLODipine (NORVASC) 10 MG tablet Take 1 tablet (10 mg total) by mouth daily.   blood glucose meter kit and supplies Dispense based on patient and insurance preference. Use 2 times daily - fasting or 2 hours after meals.   chlorthalidone (HYGROTON) 25 MG tablet Take 1 tablet (25 mg total) by mouth daily.   hydrALAZINE (APRESOLINE) 25 MG tablet Take 1 tablet (25 mg total) by mouth 3 (three) times daily.   insulin glargine (LANTUS SOLOSTAR) 100 UNIT/ML Solostar Pen Inject 10 Units into the skin daily.   Insulin Pen Needle (PEN NEEDLES) 32G X 4 MM MISC Use with solostar pen daily.    metFORMIN (GLUCOPHAGE) 500 MG tablet Take 1 tablet (500 mg total) by mouth 2 (two) times daily with a meal. Start QD for first few days, increase to BID if tolerated.   spironolactone (ALDACTONE) 25 MG tablet Take 1 tablet (25 mg total) by mouth daily.   valsartan (DIOVAN) 320 MG tablet Take 1 tablet (320 mg total) by mouth daily.   No current facility-administered medications for this visit. (Other)      REVIEW OF SYSTEMS:    ALLERGIES Allergies  Allergen Reactions   Chlorthalidone Other (See Comments)    lightheaded    PAST MEDICAL HISTORY Past Medical History:  Diagnosis Date   Anxiety    Arthritis    Hypertension    History reviewed. No pertinent surgical history.  FAMILY HISTORY History reviewed. No pertinent family history.  SOCIAL HISTORY Social History   Tobacco Use   Smoking status: Never   Smokeless tobacco: Never  Substance Use Topics   Alcohol use: Yes   Drug use: No         OPHTHALMIC EXAM:  Base Eye Exam     Visual Acuity (ETDRS)       Right Left   Dist cc 20/20 20/20    Correction: Glasses         Tonometry (Tonopen, 2:15 PM)  Right Left   Pressure 15 15         Pupils       Pupils Dark Light Shape React APD   Right PERRL 4 3 Round Brisk None   Left PERRL 4 3 Round Brisk None         Visual Fields (Counting fingers)       Left Right    Full Full         Extraocular Movement       Right Left    Full, Ortho Full, Ortho         Dilation     Both eyes: 1.0% Mydriacyl, 2.5% Phenylephrine @ 2:15 PM           Slit Lamp and Fundus Exam     External Exam       Right Left   External Normal Normal         Slit Lamp Exam       Right Left   Lids/Lashes Normal Normal   Conjunctiva/Sclera White and quiet White and quiet   Cornea Clear Clear   Anterior Chamber Deep and quiet Deep and quiet   Iris Round and reactive Round and reactive   Lens Clear Clear   Anterior Vitreous Normal Normal          Fundus Exam       Right Left   Posterior Vitreous Normal Normal   Disc Normal Normal   C/D Ratio 0.25 0.25   Macula Normal Normal   Vessels Multiple cotton-wool spots in the posterior pole, no definite specific diabetic retinopathy noted. Multiple cotton-wool spots in the posterior pole   Periphery Normal Normal            IMAGING AND PROCEDURES  Imaging and Procedures for 11/27/20  Color Fundus Photography Optos - OU - Both Eyes       Right Eye Disc findings include normal observations. Vessels : attenuated.   Left Eye Progression has no prior data. Disc findings include normal observations. Vessels : attenuated.   Notes Cotton-wool spot along the superotemporal arcade OS and superotemporal to the nerve OD.  These findings in addition to attenuated retinal artery changes as well as AV crossing changes Are highly specific for the presence of hypertensive retinopathy .  This diagnosis was was made beforeinquiring whether the patient had high blood pressure And at the level of its control.  OS, rake like defects in the superotemporal RNFL.  This does suggest previous damage from hypertensive retinopathy and/or, early glaucoma suspect.     OCT, Retina - OU - Both Eyes       Right Eye Quality was good. Scan locations included subfoveal. Central Foveal Thickness: 237. Progression has no prior data. Findings include normal foveal contour, vitreomacular adhesion .   Left Eye Quality was good. Scan locations included subfoveal. Central Foveal Thickness: 250. Progression has no prior data. Findings include normal foveal contour, vitreomacular adhesion .   Notes No pathologic findings by OCT no active maculopathy             ASSESSMENT/PLAN:  Hypertensive retinopathy of both eyes These findings in addition to attenuated retinal artery changes as well as AV crossing changes Are highly specific for the presence of hypertensive retinopathy .  This diagnosis was was  made beforeinquiring whether the patient had high blood pressure And at the level of its control.   Patient still having trouble controlling the blood pressure despite 5 medications  This  does raise the specter of possible superimposed conditions like sleep apnea triggering episodic hypertension because of nightly hypoxic events    Mild nonproliferative diabetic retinopathy of both eyes ([place]) See notes under hypertensive retinopathy  Sleep disorder, shift work With the sleep disorder the patient may very well have sleep deprivation induced sleep apnea.  May need to have a specific therapy and/or rather evaluation for nightly oxygenation     ICD-10-CM   1. Hypertensive retinopathy of both eyes  H35.033 Color Fundus Photography Optos - OU - Both Eyes    2. Retinal edema of both eyes  H35.81 Color Fundus Photography Optos - OU - Both Eyes    3. Mild nonproliferative diabetic retinopathy of both eyes without macular edema associated with type 2 diabetes mellitus (HCC)  G25.6389 OCT, Retina - OU - Both Eyes    4. Sleep disorder, shift work  G47.26       1.  Early hypertensive retinopathy notable in July by examination in the presence of a paucity of findings of nonproliferative diabetic retinopathy.  Thus this is specific hypertensive retinopathy in a patient with who has already profound retinal vascular disease from hypertensive retinopathy alone  2.  Patient encouraged to continue with his primary care physician in order to control the blood pressure and to seek further consultation if not successful  3.  We will follow-up in 8 weeks and consider fluorescein angiography next as blood pressure and blood sugar become better controlled  Ophthalmic Meds Ordered this visit:  No orders of the defined types were placed in this encounter.      Return in about 8 weeks (around 01/22/2021) for DILATE OU, OCT, COLOR FP.  There are no Patient Instructions on file for this  visit.   Explained the diagnoses, plan, and follow up with the patient and they expressed understanding.  Patient expressed understanding of the importance of proper follow up care.   Clent Demark Deland Slocumb M.D. Diseases & Surgery of the Retina and Vitreous Retina & Diabetic [place] 11/27/20     Abbreviations: M myopia (nearsighted); A astigmatism; H hyperopia (farsighted); P presbyopia; Mrx spectacle prescription;  CTL contact lenses; OD right eye; OS left eye; OU both eyes  XT exotropia; ET esotropia; PEK punctate epithelial keratitis; PEE punctate epithelial erosions; DES dry eye syndrome; MGD meibomian gland dysfunction; ATs artificial tears; PFAT's preservative free artificial tears; [place] nuclear sclerotic cataract; PSC posterior subcapsular cataract; ERM epi-retinal membrane; PVD posterior vitreous detachment; RD retinal detachment; DM diabetes mellitus; DR diabetic retinopathy; NPDR non-proliferative diabetic retinopathy; PDR proliferative diabetic retinopathy; CSME clinically significant macular edema; DME diabetic macular edema; dbh dot blot hemorrhages; CWS cotton wool spot; POAG primary open angle glaucoma; C/D cup-to-disc ratio; HVF humphrey visual field; GVF goldmann visual field; OCT optical coherence tomography; IOP intraocular pressure; BRVO Branch retinal vein occlusion; CRVO central retinal vein occlusion; CRAO central retinal artery occlusion; BRAO branch retinal artery occlusion; RT retinal tear; SB scleral buckle; PPV pars plana vitrectomy; VH Vitreous hemorrhage; PRP panretinal laser photocoagulation; IVK intravitreal kenalog; VMT vitreomacular traction; MH Macular hole;  NVD neovascularization of the disc; NVE neovascularization elsewhere; AREDS age related eye disease study; ARMD age related macular degeneration; POAG primary open angle glaucoma; EBMD epithelial/anterior basement membrane dystrophy; ACIOL anterior chamber intraocular lens; IOL intraocular lens; PCIOL posterior chamber  intraocular lens; Phaco/IOL phacoemulsification with intraocular lens placement; [place] photorefractive keratectomy; LASIK laser assisted in situ keratomileusis; HTN hypertension; DM diabetes mellitus; COPD chronic obstructive pulmonary disease

## 2020-11-27 NOTE — Assessment & Plan Note (Addendum)
These findings in addition to attenuated retinal artery changes as well as AV crossing changes Are highly specific for the presence of hypertensive retinopathy .  This diagnosis was was made beforeinquiring whether the patient had high blood pressure And at the level of its control.   Patient still having trouble controlling the blood pressure despite 5 medications  This does raise the specter of possible superimposed conditions like sleep apnea triggering episodic hypertension because of nightly hypoxic events

## 2020-11-27 NOTE — Assessment & Plan Note (Signed)
With the sleep disorder the patient may very well have sleep deprivation induced sleep apnea.  May need to have a specific therapy and/or rather evaluation for nightly oxygenation

## 2020-12-06 ENCOUNTER — Ambulatory Visit (INDEPENDENT_AMBULATORY_CARE_PROVIDER_SITE_OTHER): Payer: BC Managed Care – PPO | Admitting: Family Medicine

## 2020-12-06 ENCOUNTER — Other Ambulatory Visit: Payer: Self-pay

## 2020-12-06 ENCOUNTER — Encounter: Payer: Self-pay | Admitting: Family Medicine

## 2020-12-06 VITALS — BP 132/78 | HR 80 | Temp 98.4°F | Resp 16 | Wt 205.0 lb

## 2020-12-06 DIAGNOSIS — E119 Type 2 diabetes mellitus without complications: Secondary | ICD-10-CM

## 2020-12-06 DIAGNOSIS — Z23 Encounter for immunization: Secondary | ICD-10-CM | POA: Diagnosis not present

## 2020-12-06 NOTE — Progress Notes (Signed)
Subjective:  Patient ID: Kevin Jarvis, male    DOB: 1965/08/07  Age: 55 y.o. MRN: [id number]  CC:  Chief Complaint  Patient presents with   Diabetes    Patient states everything has been going well   Headache    Normal headache denies nausea, vomiting, dizziness. Has been going on for about 3-4 days    HPI Jayveion Stalling presents for   Diabetes: New onset with hyperglycemia, see prior visits, last on July 28.  Lantus 14 units daily, Metformin 500 mg twice daily at that time. Lowest reading 122, other readings were in the 200s.  Still some blurry vision.  Increase Lantus to 16 units, regular meals discussed, hypoglycemia precautions.  Evaluated by ophthalmology August 29, Dr. Zadie Rhine.  Hypertensive retinopathy.  Hypertensive treatment discussed, and option of possible sleep study. No known snoring. No daytime somnolence.  Home readings: Fasting:103 today. 80-90 range.  Highest recently(all fasting) 148 - at 14units. Has also improved diet. 3 meals per day.  Symptomatic lows: none - lowest 80-91. Felt fine.   Does report some headache, no associated nausea/vomiting or acute vision changes.  3-4 days ago. Resolved with tylenol last night. None today. No fever. No cough, sore throat or congestion. No sick contacts.   BP Readings from Last 3 Encounters:  12/06/20 132/78  10/26/20 132/66  10/12/20 132/70    Lab Results  Component Value Date   HGBA1C 16.3 (H) 10/17/2020   HGBA1C 16.2 (H) 10/06/2020   HGBA1C 5.7 (H) 09/24/2019   Lab Results  Component Value Date   MICROALBUR 1.1 10/12/2020   [place] [address] 10/12/2020   CREATININE 1.19 10/17/2020    Patient Active Problem List   Diagnosis Date Noted   Hypertensive retinopathy of both eyes 11/27/2020   Retinal edema of both eyes 11/27/2020   Mild nonproliferative diabetic retinopathy of both eyes ([place]) 11/27/2020   Type 2 diabetes mellitus with hyperglycemia ([place]) 10/26/2020   Iron deficiency anemia 06/07/2017   Anxiety  12/12/2011   Long term use of drug 12/12/2011   Hypertension 12/12/2011   Sleep disorder, 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 Allergies  Allergen Reactions   Chlorthalidone Other (See Comments)    lightheaded   Prior to Admission medications   Medication Sig Start Date End Date Taking? Authorizing Provider  ACCU-CHEK GUIDE test strip USE 1 STRIP TO CHECK GLUCOSE TWICE DAILY FASTING OR 2 HOURS AFTER MEALS 10/10/20  Yes [provider]  Accu-Chek Softclix Lancets lancets 2 (two) times daily. 10/10/20  Yes [provider]  amLODipine (NORVASC) 10 MG tablet Take 1 tablet (10 mg total) by mouth daily. 10/12/20  Yes Wendie Agreste, MD  blood glucose meter kit and supplies Dispense based on patient and insurance preference. Use 2 times daily - fasting or 2 hours after meals. 10/06/20  Yes Wendie Agreste, MD  hydrALAZINE (APRESOLINE) 25 MG tablet Take 1 tablet (25 mg total) by mouth 3 (three) times daily. 10/13/20 01/11/21 Yes Skeet Latch, MD  insulin glargine (LANTUS SOLOSTAR) 100 UNIT/ML Solostar Pen Inject 10 Units into the skin daily. 10/06/20  Yes Wendie Agreste, MD  Insulin Pen Needle (PEN NEEDLES) 32G X 4 MM MISC Use with solostar pen daily. 10/06/20  Yes Wendie Agreste, MD  metFORMIN (GLUCOPHAGE) 500 MG tablet Take 1 tablet (500 mg total)  by mouth 2 (two) times daily with a meal. Start QD for first few days, increase to BID if tolerated. 10/06/20  Yes Wendie Agreste, MD  valsartan (DIOVAN) 320 MG tablet Take 1 tablet (320 mg total) by mouth daily. 09/22/20  Yes Cleaver, Jossie Ng, NP  chlorthalidone (HYGROTON) 25 MG tablet Take 1 tablet (25 mg total) by mouth daily. 08/22/20 11/20/20  Skeet Latch, MD  spironolactone (ALDACTONE) 25 MG tablet Take 1 tablet (25 mg total) by mouth daily. 03/06/20 06/04/20  O'Neal, Cassie Freer, MD    Social History   Socioeconomic History   Marital status: Married    Spouse name: Not on file   Number of children: 2   Years of education: Not on file   Highest education level: Not on file  Occupational History   Occupation: Glass blower/designer  Tobacco Use   Smoking status: Never   Smokeless tobacco: Never  Substance and Sexual Activity   Alcohol use: Yes   Drug use: No   Sexual activity: Not on file  Other Topics Concern   Not on file  Social History Narrative   From [place] - Moved to [place] 2000   Lives with wife and son   Social Determinants of Radio broadcast assistant Strain: Not on file  Food Insecurity: Not on file  Transportation Needs: Not on file  Physical Activity: Not on file  Stress: Not on file  Social Connections: Not on file  Intimate Partner Violence: Not on file    Review of Systems   Objective:   Vitals:   12/06/20 1329  BP: 132/78  Pulse: 80  Resp: 16  Temp: 98.4 F (36.9 C)  TempSrc: Temporal  SpO2: 97%  Weight: 205 lb (93 kg)     Physical Exam Vitals reviewed.  Constitutional:      Appearance: He is well-developed.  HENT:     Head: Normocephalic and atraumatic.  Neck:     Vascular: No carotid bruit or JVD.  Cardiovascular:     Rate and Rhythm: Normal rate and regular rhythm.     Heart sounds: Normal heart sounds. No murmur heard. Pulmonary:     Effort: Pulmonary effort is normal.     Breath sounds: Normal breath sounds. No rales.  Musculoskeletal:     Right lower leg: No edema.     Left lower leg: No edema.  Skin:    General: Skin is warm and dry.  Neurological:     General: No focal deficit present.     Mental Status: He is alert and oriented to person, place, and time.  Psychiatric:        Mood and Affect: Mood normal.     Assessment & Plan:  Chaun Uemura is a 55 y.o. male . Diabetes mellitus, new onset ([place])  -Significant improved control.  No true hypoglycemia but much lower readings.  We will decrease  Lantus to 14 units for now, continue metformin same dose, recheck 1 month with A1c.  -Prior headache now resolved, RTC precautions if recurs.  -Ophthalmology note reviewed.  Blood pressure stable past 3 visits, denies daytime somnolence or snoring.  Hold on further testing for now, but can discuss at next visit as well.  Flu vaccine need - Plan: Flu Vaccine QUAD 6+ mos PF IM (Fluarix Quad PF)  Need for pneumococcal vaccine - Plan: Pneumococcal polysaccharide vaccine 23-valent greater than or equal to 2yo subcutaneous/IM   No orders of the defined types were placed in this  encounter.  Patient Instructions  Ok to decrease insulin to 14 units for now, Keep a record of your blood sugars. If any 200's or lower than 80 let me know. Recheck in 1 month. No other med changes at this time.   If headaches return, please follow up to discuss further.   Return to the clinic or go to the nearest emergency room if any of your symptoms worsen or new symptoms occur.       Signed,   Merri Ray, MD [place], [facility] 12/06/20 1:54 PM

## 2020-12-06 NOTE — Patient Instructions (Addendum)
Ok to decrease insulin to 14 units for now, Keep a record of your blood sugars. If any 200's or lower than 80 let me know. Recheck in 1 month. No other med changes at this time.   If headaches return, please follow up to discuss further.   Return to the clinic or go to the nearest emergency room if any of your symptoms worsen or new symptoms occur.

## 2021-01-03 ENCOUNTER — Encounter: Payer: BC Managed Care – PPO | Attending: Family Medicine | Admitting: Dietician

## 2021-01-03 ENCOUNTER — Encounter: Payer: Self-pay | Admitting: Dietician

## 2021-01-03 ENCOUNTER — Other Ambulatory Visit: Payer: Self-pay

## 2021-01-03 DIAGNOSIS — Z794 Long term (current) use of insulin: Secondary | ICD-10-CM | POA: Insufficient documentation

## 2021-01-03 DIAGNOSIS — E1165 Type 2 diabetes mellitus with hyperglycemia: Secondary | ICD-10-CM | POA: Diagnosis not present

## 2021-01-03 NOTE — Progress Notes (Signed)
Diabetes Self-Management Education  Visit Type: First/Initial  Appt. Start Time: 0800 Appt. End Time: 0900  01/03/2021  Mr. Kevin Jarvis, identified by name and date of birth, is a 55 y.o. male with a diagnosis of Diabetes: Type 2.   ASSESSMENT Pt is taking metformin BID, and Lantus 14 units before bed.  Pt reports most recent A1c of 16.3 and a CBG of over 500 during office visit. Pt reports symptoms of polyuria, polydypsia, and blurry vision during the past few months. These symptoms have since subsided since starting medication. Pt brought log of blood sugar reading. FBG values are around 100, PPBG range from 160-250. Pt reports history of hypertension, is now controlled with medication. Reading usually 130-140/70.  Pt is Glass blower/designer for work, is very active at work. 7-7:30 4 days on, 3 days off every other week. Pt rests on their days off, but sometimes works short shifts at a [place]. Pt does not eat sweets, does not drink soda or SSB, may drink 2 bottles of water each day. Pt reports going long times between meals, sometimes up to 8 hours. Pt reports wife monitors their diet very closely and has been preparing their meals since their diagnosis.  Height 5\' 9"  (1.753 m), weight 209 lb 4.8 oz (94.9 kg). Body mass index is 30.91 kg/m.   Diabetes Self-Management Education - 01/03/21 3151       Visit Information   Visit Type First/Initial      Initial Visit   Diabetes Type Type 2    Are you currently following a meal plan? No    Are you taking your medications as prescribed? Yes    Date Diagnosed 09/2020      Health Coping   How would you rate your overall health? Good      Psychosocial Assessment   Patient Belief/Attitude about Diabetes Motivated to manage diabetes    Self-management support Doctor's office;Family    Other persons present Patient    Patient Concerns Glycemic Control;Nutrition/Meal planning    Special Needs None    Preferred Learning Style No  preference indicated    Learning Readiness Ready    How often do you need to have someone help you when you read instructions, pamphlets, or other written materials from your doctor or pharmacy? 1 - Never    What is the last grade level you completed in school? high school graduate      Pre-Education Assessment   Patient understands the diabetes disease and treatment process. Needs Instruction    Patient understands incorporating nutritional management into lifestyle. Needs Instruction    Patient undertands incorporating physical activity into lifestyle. Needs Instruction    Patient understands using medications safely. Needs Instruction    Patient understands monitoring blood glucose, interpreting and using results Needs Instruction    Patient understands prevention, detection, and treatment of acute complications. Needs Instruction    Patient understands prevention, detection, and treatment of chronic complications. Needs Instruction    Patient understands how to develop strategies to address psychosocial issues. Needs Instruction    Patient understands how to develop strategies to promote health/change behavior. Needs Instruction      Complications   Last HgB A1C per patient/outside source 16.3 %   10/19/2020   How often do you check your blood sugar? 3-4 times / week    Fasting Blood glucose range (mg/dL) 70-129    Postprandial Blood glucose range (mg/dL) 180-200;>200    Number of hypoglycemic episodes per month 0  Number of hyperglycemic episodes per week 0   Previously experienced blurry vison, polydypsia, polyuria. Have since subsided   Have you had a dilated eye exam in the past 12 months? Yes    Have you had a dental exam in the past 12 months? Yes    Are you checking your feet? No      Dietary Intake   Breakfast 2 slices whole wheat bread, fruit smoothie with apple, avocado, grapes    Lunch Brown rice, vegetables, chicken    Dinner Chicken and Environmental health practitioner.    Beverage(s)  water, smoothie      Exercise   Exercise Type ADL's;Light (walking / raking leaves)   Very active at work.   How many days per week to you exercise? 0    How many minutes per day do you exercise? 0    Total minutes per week of exercise 0      Patient Education   Previous Diabetes Education No    Disease state  Definition of diabetes, type 1 and 2, and the diagnosis of diabetes;Factors that contribute to the development of diabetes    Nutrition management  Reviewed blood glucose goals for pre and post meals and how to evaluate the patients' food intake on their blood glucose level.;Role of diet in the treatment of diabetes and the relationship between the three main macronutrients and blood glucose level;Carbohydrate counting;Food label reading, portion sizes and measuring food.;Meal timing in regards to the patients' current diabetes medication.    Physical activity and exercise  Role of exercise on diabetes management, blood pressure control and cardiac health.    Medications Taught/reviewed insulin injection, site rotation, insulin storage and needle disposal.;Reviewed patients medication for diabetes, action, purpose, timing of dose and side effects.    Monitoring Purpose and frequency of SMBG.;Identified appropriate SMBG and/or A1C goals.    Acute complications Taught treatment of hypoglycemia - the 15 rule.;Discussed and identified patients' treatment of hyperglycemia.    Chronic complications Relationship between chronic complications and blood glucose control;Identified and discussed with patient  current chronic complications    Psychosocial adjustment Identified and addressed patients feelings and concerns about diabetes    Personal strategies to promote health Lifestyle issues that need to be addressed for better diabetes care   Consistent meal timing     Individualized Goals (developed by patient)   Nutrition Follow meal plan discussed    Physical Activity Not Applicable     Medications take my medication as prescribed    Monitoring  test my blood glucose as discussed;send in my blood glucose log as discussed    Health Coping discuss diabetes with (comment)   Contact PCP if you experience consistent hypoglycemia     Post-Education Assessment   Patient understands the diabetes disease and treatment process. Needs Review    Patient understands incorporating nutritional management into lifestyle. Needs Review    Patient undertands incorporating physical activity into lifestyle. Needs Review    Patient understands using medications safely. Needs Review    Patient understands monitoring blood glucose, interpreting and using results Needs Review    Patient understands prevention, detection, and treatment of acute complications. Needs Review    Patient understands prevention, detection, and treatment of chronic complications. Needs Review    Patient understands how to develop strategies to address psychosocial issues. Needs Review    Patient understands how to develop strategies to promote health/change behavior. Needs Review      Outcomes   Expected Outcomes Demonstrated interest  in learning. Expect positive outcomes    Future DMSE 2 months    Program Status Not Completed             Individualized Plan for Diabetes Self-Management Training:   Learning Objective:  Patient will have a greater understanding of diabetes self-management. Patient education plan is to attend individual and/or group sessions per assessed needs and concerns.   Plan:   Patient Instructions  Check your blood sugar each morning before you eat or drink anything. Look for numbers between 70-100. Check your blood sugar 2 hours after starting a meal. Your blood sugar should be no higher than 180.  If you feel weak, shaky, sweaty, dizzy or hungry, check your blood sugar. If it is below 70, follow the "Rule of 15" as we discussed. If you start to experience low blood sugars consistently,  contact your doctor!  Increase your water intake to 3-4 bottles each day.  Work towards eating three meals a day, about 5-6 hours apart!  Begin to recognize carbohydrates in your food choices!  Begin to build your meals using the proportions of the Balanced Plate. First, select your carb choice(s) for the meal Next, select your source of protein to pair with your carb choice(s). Finally, complete the remaining half of your meal with a variety of non-starchy vegetables.   Expected Outcomes:  Demonstrated interest in learning. Expect positive outcomes  Education material provided: ADA - How to Thrive: A Guide for Your Journey with Diabetes and Meal plan card  If problems or questions, patient to contact team via:  Phone and Email  Future DSME appointment: 2 months

## 2021-01-03 NOTE — Patient Instructions (Addendum)
Check your blood sugar each morning before you eat or drink anything. Look for numbers between 70-100. Check your blood sugar 2 hours after starting a meal. Your blood sugar should be no higher than 180.  If you feel weak, shaky, sweaty, dizzy or hungry, check your blood sugar. If it is below 70, follow the "Rule of 15" as we discussed. If you start to experience low blood sugars consistently, contact your doctor!  Increase your water intake to 3-4 bottles each day.  Work towards eating three meals a day, about 5-6 hours apart!  Begin to recognize carbohydrates in your food choices!  Begin to build your meals using the proportions of the Balanced Plate. First, select your carb choice(s) for the meal Next, select your source of protein to pair with your carb choice(s). Finally, complete the remaining half of your meal with a variety of non-starchy vegetables.

## 2021-01-12 ENCOUNTER — Ambulatory Visit (INDEPENDENT_AMBULATORY_CARE_PROVIDER_SITE_OTHER): Payer: BC Managed Care – PPO | Admitting: Family Medicine

## 2021-01-12 ENCOUNTER — Other Ambulatory Visit: Payer: Self-pay

## 2021-01-12 VITALS — BP 138/74 | HR 70 | Temp 98.1°F | Resp 16 | Ht 69.0 in | Wt 207.8 lb

## 2021-01-12 DIAGNOSIS — Z23 Encounter for immunization: Secondary | ICD-10-CM | POA: Diagnosis not present

## 2021-01-12 DIAGNOSIS — E119 Type 2 diabetes mellitus without complications: Secondary | ICD-10-CM | POA: Diagnosis not present

## 2021-01-12 DIAGNOSIS — I10 Essential (primary) hypertension: Secondary | ICD-10-CM | POA: Diagnosis not present

## 2021-01-12 DIAGNOSIS — E781 Pure hyperglyceridemia: Secondary | ICD-10-CM | POA: Diagnosis not present

## 2021-01-12 LAB — POCT GLYCOSYLATED HEMOGLOBIN (HGB A1C): Hemoglobin A1C: 6.1 % — AB (ref 4.0–5.6)

## 2021-01-12 LAB — COMPREHENSIVE METABOLIC PANEL
ALT: 17 U/L (ref 0–53)
AST: 21 U/L (ref 0–37)
Albumin: 4.6 g/dL (ref 3.5–5.2)
Alkaline Phosphatase: 58 U/L (ref 39–117)
BUN: 20 mg/dL (ref 6–23)
CO2: 29 mEq/L (ref 19–32)
Calcium: 10 mg/dL (ref 8.4–10.5)
Chloride: 101 mEq/L (ref 96–112)
Creatinine, Ser: 1.21 mg/dL (ref 0.40–1.50)
GFR: 66.88 mL/min (ref >60.00)
Glucose, Bld: 75 mg/dL (ref 70–99)
Potassium: 3.9 mEq/L (ref 3.5–5.1)
Sodium: 138 mEq/L (ref 135–145)
Total Bilirubin: 0.5 mg/dL (ref 0.2–1.2)
Total Protein: 7.7 g/dL (ref 6.0–8.3)

## 2021-01-12 LAB — LIPID PANEL
Cholesterol: 112 mg/dL (ref 0–200)
HDL: 42.9 mg/dL (ref >39.00)
LDL Cholesterol: 60 mg/dL (ref 0–99)
NonHDL: 68.88
Total CHOL/HDL Ratio: 3
Triglycerides: 42 mg/dL (ref 0.0–149.0)
VLDL: 8.4 mg/dL (ref 0.0–40.0)

## 2021-01-12 MED ORDER — AMLODIPINE BESYLATE 10 MG PO TABS: 10.0000 mg | ORAL_TABLET | Freq: Every day | ORAL | 1 refills | Status: DC

## 2021-01-12 NOTE — Patient Instructions (Addendum)
Diabetes is much better.  Great work!  Let's stop the insulin for now.  Monitor your blood sugar daily and if readings start to increase, then increase the metformin to 2 pills twice per day.  Let me know if you need to make this change, or if blood sugars are higher me know and we can decide on further medication changes.  Keep a record of your blood pressures outside of the office and if still elevated with technique below, return with you machine to make sure it is accurate.  How to Take Your Blood Pressure Blood pressure is a measurement of how strongly your blood is pressing against the walls of your arteries. Arteries are blood vessels that carry blood from your heart throughout your body. Your health care provider takes your blood pressure at each office visit. You can also take your own blood pressure at home with a blood pressure monitor. You may need to take your own blood pressure to: Confirm a diagnosis of high blood pressure (hypertension). Monitor your blood pressure over time. Make sure your blood pressure medicine is working. Supplies needed: Blood pressure monitor. Dining room chair to sit in. Table or desk. Small notebook and pencil or pen. How to prepare To get the most accurate reading, avoid the following for 30 minutes before you check your blood pressure: Drinking caffeine. Drinking alcohol. Eating. Smoking. Exercising. Five minutes before you check your blood pressure: Use the bathroom and urinate so that you have an empty bladder. Sit quietly in a dining room chair. Do not sit in a soft couch or an armchair. Do not talk. How to take your blood pressure To check your blood pressure, follow the instructions in the manual that came with your blood pressure monitor. If you have a digital blood pressure monitor, the instructions may be as follows: Sit up straight in a chair. Place your feet on the floor. Do not cross your ankles or legs. Rest your left arm at the level  of your heart on a table or desk or on the arm of a chair. Pull up your shirt sleeve. Wrap the blood pressure cuff around the upper part of your left arm, 1 inch (2.5 cm) above your elbow. It is best to wrap the cuff around bare skin. Fit the cuff snugly around your arm. You should be able to place only one finger between the cuff and your arm. Position the cord so that it rests in the bend of your elbow. Press the power button. Sit quietly while the cuff inflates and deflates. Read the digital reading on the monitor screen and write the numbers down (record them) in a notebook. Wait 2-3 minutes, then repeat the steps, starting at step 1. What does my blood pressure reading mean? A blood pressure reading consists of a higher number over a lower number. Ideally, your blood pressure should be below 120/80. The first ("top") number is called the systolic pressure. It is a measure of the pressure in your arteries as your heart beats. The second ("bottom") number is called the diastolic pressure. It is a measure of the pressure in your arteries as the heart relaxes. Blood pressure is classified into five stages. The following are the stages for adults who do not have a short-term serious illness or a chronic condition. Systolic pressure and diastolic pressure are measured in a unit called mm Hg (millimeters of mercury).  Normal Systolic pressure: below 993. Diastolic pressure: below 80. Elevated Systolic pressure: 716-967. Diastolic pressure:  below 80. Hypertension stage 1 Systolic pressure: 833-825. Diastolic pressure: 05-39. Hypertension stage 2 Systolic pressure: 767 or above. Diastolic pressure: 90 or above. You can have elevated blood pressure or hypertension even if only the systolic or only the diastolic number in your reading is higher than normal. Follow these instructions at home: Medicines Take over-the-counter and prescription medicines only as told by your health care  provider. Tell your health care provider if you are having any side effects from blood pressure medicine. General instructions Check your blood pressure as often as recommended by your health care provider. Check your blood pressure at the same time every day. Take your monitor to the next appointment with your health care provider to make sure that: You are using it correctly. It provides accurate readings. Understand what your goal blood pressure numbers are. Keep all follow-up visits as told by your health care provider. This is important. General tips Your health care provider can suggest a reliable monitor that will meet your needs. There are several types of home blood pressure monitors. Choose a monitor that has an arm cuff. Do not choose a monitor that measures your blood pressure from your wrist or finger. Choose a cuff that wraps snugly around your upper arm. You should be able to fit only one finger between your arm and the cuff. You can buy a blood pressure monitor at most drugstores or online. Where to find more information American Heart Association: www.heart.org Contact a health care provider if: Your blood pressure is consistently high. Your blood pressure is suddenly low. Get help right away if: Your systolic blood pressure is higher than 180. Your diastolic blood pressure is higher than 120. Summary Blood pressure is a measurement of how strongly your blood is pressing against the walls of your arteries. A blood pressure reading consists of a higher number over a lower number. Ideally, your blood pressure should be below 120/80. Check your blood pressure at the same time every day. Avoid caffeine, alcohol, smoking, and exercise for 30 minutes prior to checking your blood pressure. These agents can affect the accuracy of the blood pressure reading. This information is not intended to replace advice given to you by your health care provider. Make sure you discuss any  questions you have with your health care provider. Document Revised: 01/26/2020 Document Reviewed: 03/12/2019 Elsevier Patient Education  2022 [employer].

## 2021-01-12 NOTE — Progress Notes (Signed)
Subjective:  Patient ID: Kevin Jarvis, male    DOB: Oct 17, 1965  Age: 55 y.o. MRN: [id number]  CC:  Chief Complaint  Patient presents with   Hypertension    Pt in need of refill amlodipine, no concerns denies physical sxs,    Diabetes    Do recehck of A1c no concerns from pt reports he is doing well, home readings 103,105, 99, 91, 107 prior to eating    Immunizations    Pt due for second shingles vaccine discuss having today     HPI Kevin Jarvis presents for   Diabetes: New onset in July, with hyperglycemia.  Treated with Lantus, metformin.  Diabetic nutrition eval October 5.  Ophthalmology eval in August, hypertensive retinopathy.  Option of sleep study, but no daytime somnolence or snoring.  Continued BP monitoring planned. Exercise - more walking, active at work- Teacher, adult education job.  Watching diet - cooking at home.  Lantus 14 units daily, metformin 500 mg twice daily, he is on ARB, no current statin. Home readings fasting: 91-117.  Home readings postprandial: 117 No symptomatic lows.  Microalbumin: Normal ratio 10/12/2020 Optho, foot exam, pneumovax: Up-to-date  Lab Results  Component Value Date   HGBA1C 6.1 (A) 01/12/2021   HGBA1C 16.3 (H) 10/17/2020   HGBA1C 16.2 (H) 10/06/2020   Lab Results  Component Value Date   MICROALBUR 1.1 10/12/2020   LDLCALC 84 10/12/2020   CREATININE 1.19 10/17/2020   Hypertension: Norvasc 10mqd, diovan 3261mqd, chlorthalidone 2533md, hydralazine 73m71md, spirinolactone 73mg35m  No new side effects. Followed by cardiology prior.  Home readings: 140-150 at times on home machine.  No snoring/sleep difficulty/daytime somnolence.  BP Readings from Last 3 Encounters:  01/12/21 138/74  12/06/20 132/78  10/26/20 132/66   Lab Results  Component Value Date   CREATININE 1.19 10/17/2020   HM: Shingrix today.    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 Allergies  Allergen Reactions   Chlorthalidone Other (See Comments)    lightheaded   Prior to Admission medications   Medication Sig Start Date End Date Taking? Authorizing Provider  ACCU-CHEK GUIDE test strip USE 1 STRIP TO CHECK GLUCOSE TWICE DAILY FASTING OR 2 HOURS AFTER MEALS 10/10/20   [provider]  Accu-Chek Softclix Lancets lancets 2 (two) times daily. 10/10/20   [provider]  amLODipine (NORVASC) 10 MG tablet Take 1 tablet (10 mg total) by mouth daily. 10/12/20   GreenWendie Agreste blood glucose meter kit and supplies Dispense based on patient and insurance preference. Use 2 times daily - fasting or 2 hours after meals. 10/06/20   GreenWendie Agreste chlorthalidone (HYGROTON) 25 MG tablet Take 1 tablet (25 mg total) by mouth daily. 08/22/20 11/20/20  RandoSkeet Latch hydrALAZINE (APRESOLINE) 25 MG tablet Take 1 tablet (25 mg total) by mouth 3 (three) times daily. 10/13/20 01/11/21  RandoOval Linsey  Tiffany, MD  insulin glargine (LANTUS SOLOSTAR) 100 UNIT/ML Solostar Pen Inject 10 Units into the skin daily. 10/06/20   Wendie Agreste, MD  Insulin Pen Needle (PEN NEEDLES) 32G X 4 MM MISC Use with solostar pen daily. 10/06/20   Wendie Agreste, MD  metFORMIN (GLUCOPHAGE) 500 MG tablet Take 1 tablet (500 mg total) by mouth 2 (two) times daily with a meal. Start QD for first few days, increase to BID if tolerated. 10/06/20   Wendie Agreste, MD  spironolactone (ALDACTONE) 25 MG tablet Take 1 tablet (25  mg total) by mouth daily. 03/06/20 06/04/20  Geralynn Rile, MD  valsartan (DIOVAN) 320 MG tablet Take 1 tablet (320 mg total) by mouth daily. 09/22/20   Cleaver, Jossie Ng, NP   Social History   Socioeconomic History   Marital status: Married    Spouse name: Not on file   Number of children: 2   Years of education: Not on file   Highest education level: Not on file  Occupational History   Occupation: Glass blower/designer  Tobacco Use   Smoking status: Never   Smokeless tobacco: Never  Substance and Sexual Activity   Alcohol use: Yes   Drug use: No   Sexual activity: Not on file  Other Topics Concern   Not on file  Social History Narrative   From [place] - Moved to [place] 2000   Lives with wife and son   Social Determinants of Health   Financial Resource Strain: Not on file  Food Insecurity: Not on file  Transportation Needs: Not on file  Physical Activity: Not on file  Stress: Not on file  Social Connections: Not on file  Intimate Partner Violence: Not on file    Review of Systems  Constitutional:  Negative for fatigue and unexpected weight change.  Eyes:  Negative for visual disturbance.  Respiratory:  Negative for cough, chest tightness and shortness of breath.   Cardiovascular:  Negative for chest pain, palpitations and leg swelling.  Gastrointestinal:  Negative for abdominal pain and blood in stool.  Neurological:  Negative for dizziness, light-headedness and headaches.    Objective:   Vitals:   01/12/21 0954  BP: 138/74  Pulse: 70  Resp: 16  Temp: 98.1 F (36.7 C)  TempSrc: Temporal  SpO2: 98%  Weight: 207 lb 12.8 oz (94.3 kg)  Height: 5' 9" (1.753 m)     Physical Exam Vitals reviewed.  Constitutional:      Appearance: He is well-developed.  HENT:     Head: Normocephalic and atraumatic.  Neck:     Vascular: No carotid bruit or JVD.  Cardiovascular:     Rate and Rhythm: Normal rate and regular rhythm.     Heart sounds: Normal heart sounds. No  murmur heard. Pulmonary:     Effort: Pulmonary effort is normal.     Breath sounds: Normal breath sounds. No rales.  Musculoskeletal:     Right lower leg: No edema.     Left lower leg: No edema.  Skin:    General: Skin is warm and dry.  Neurological:     Mental Status: He is alert and oriented to person, place, and time.  Psychiatric:        Mood and Affect: Mood normal.   Results for orders placed or performed in visit on 01/12/21  POCT glycosylated hemoglobin (Hb A1C)  Result Value Ref Range   Hemoglobin A1C 6.1 (A) 4.0 - 5.6 %   HbA1c POC (<>  result, manual entry)     HbA1c, POC (prediabetic range)     HbA1c, POC (controlled diabetic range)         Assessment & Plan:  Kevin Jarvis is a 55 y.o. male . Diabetes mellitus, new onset ([place]) - Plan: POCT glycosylated hemoglobin (Hb A1C)  -Impressive improvement in diabetic control.  We will stop insulin.  Continue metformin 500 mg twice daily home monitoring, option of 1000 mg twice daily dosing, and update next few weeks  Essential hypertension - Plan: amLODipine (NORVASC) 10 MG tablet  -Overall stable in office, on multiple med regimen as above.  Denies sleep apnea symptoms.  Close monitoring at home with RTC precautions if home readings different than in office testing.  Need for shingles vaccine - Plan: Varicella-zoster vaccine IM  Hypertriglyceridemia - Plan: Comprehensive metabolic panel, Lipid panel  -Check labs.  Consider low-dose statin.  Meds ordered this encounter  Medications   amLODipine (NORVASC) 10 MG tablet    Sig: Take 1 tablet (10 mg total) by mouth daily.    Dispense:  90 tablet    Refill:  1    Patient Instructions  Diabetes is much better.  Great work!  Let's stop the insulin for now.  Monitor your blood sugar daily and if readings start to increase, then increase the metformin to 2 pills twice per day.  Let me know if you need to make this change, or if blood sugars are higher me know and we can  decide on further medication changes.  Keep a record of your blood pressures outside of the office and if still elevated with technique below, return with you machine to make sure it is accurate.  How to Take Your Blood Pressure Blood pressure is a measurement of how strongly your blood is pressing against the walls of your arteries. Arteries are blood vessels that carry blood from your heart throughout your body. Your health care provider takes your blood pressure at each office visit. You can also take your own blood pressure at home with a blood pressure monitor. You may need to take your own blood pressure to: Confirm a diagnosis of high blood pressure (hypertension). Monitor your blood pressure over time. Make sure your blood pressure medicine is working. Supplies needed: Blood pressure monitor. Dining room chair to sit in. Table or desk. Small notebook and pencil or pen. How to prepare To get the most accurate reading, avoid the following for 30 minutes before you check your blood pressure: Drinking caffeine. Drinking alcohol. Eating. Smoking. Exercising. Five minutes before you check your blood pressure: Use the bathroom and urinate so that you have an empty bladder. Sit quietly in a dining room chair. Do not sit in a soft couch or an armchair. Do not talk. How to take your blood pressure To check your blood pressure, follow the instructions in the manual that came with your blood pressure monitor. If you have a digital blood pressure monitor, the instructions may be as follows: Sit up straight in a chair. Place your feet on the floor. Do not cross your ankles or legs. Rest your left arm at the level of your heart on a table or desk or on the arm of a chair. Pull up your shirt sleeve. Wrap the blood pressure cuff around the upper part of your left arm, 1 inch (2.5 cm) above your elbow. It is best to wrap the cuff around bare skin. Fit the cuff snugly around your arm. You should  be able to place only one finger between the cuff and your arm. Position the cord so that it rests in the bend of your elbow. Press the power button. Sit quietly while the cuff inflates and deflates. Read the digital reading on the monitor screen and write the numbers down (record them) in a notebook. Wait 2-3 minutes, then repeat the steps, starting at step 1. What does my blood pressure reading mean? A blood pressure reading consists of a higher number over a lower number. Ideally, your blood pressure should be below 120/80. The first ("top") number is called the systolic pressure. It is a measure of the pressure in your arteries as your heart beats. The second ("bottom") number is called the diastolic pressure. It is a measure of the pressure in your arteries as the heart relaxes. Blood pressure is classified into five stages. The following are the stages for adults who do not have a short-term serious illness or a chronic condition. Systolic pressure and diastolic pressure are measured in a unit called mm Hg (millimeters of mercury).  Normal Systolic pressure: below 106. Diastolic pressure: below 80. Elevated Systolic pressure: 269-485. Diastolic pressure: below 80. Hypertension stage 1 Systolic pressure: 462-703. Diastolic pressure: 50-09. Hypertension stage 2 Systolic pressure: 381 or above. Diastolic pressure: 90 or above. You can have elevated blood pressure or hypertension even if only the systolic or only the diastolic number in your reading is higher than normal. Follow these instructions at home: Medicines Take over-the-counter and prescription medicines only as told by your health care provider. Tell your health care provider if you are having any side effects from blood pressure medicine. General instructions Check your blood pressure as often as recommended by your health care provider. Check your blood pressure at the same time every day. Take your monitor to the next  appointment with your health care provider to make sure that: You are using it correctly. It provides accurate readings. Understand what your goal blood pressure numbers are. Keep all follow-up visits as told by your health care provider. This is important. General tips Your health care provider can suggest a reliable monitor that will meet your needs. There are several types of home blood pressure monitors. Choose a monitor that has an arm cuff. Do not choose a monitor that measures your blood pressure from your wrist or finger. Choose a cuff that wraps snugly around your upper arm. You should be able to fit only one finger between your arm and the cuff. You can buy a blood pressure monitor at most drugstores or online. Where to find more information American Heart Association: www.heart.org Contact a health care provider if: Your blood pressure is consistently high. Your blood pressure is suddenly low. Get help right away if: Your systolic blood pressure is higher than 180. Your diastolic blood pressure is higher than 120. Summary Blood pressure is a measurement of how strongly your blood is pressing against the walls of your arteries. A blood pressure reading consists of a higher number over a lower number. Ideally, your blood pressure should be below 120/80. Check your blood pressure at the same time every day. Avoid caffeine, alcohol, smoking, and exercise for 30 minutes prior to checking your blood pressure. These agents can affect the accuracy of the blood pressure reading. This information is not intended to replace advice given to you by your health care provider. Make sure you discuss any questions you have with your health care provider. Document Revised: 01/26/2020 Document Reviewed: 03/12/2019  Elsevier Patient Education  2022 [place],   Merri Ray, MD [place], [facility] 01/12/21 11:24 AM

## 2021-01-22 ENCOUNTER — Encounter (INDEPENDENT_AMBULATORY_CARE_PROVIDER_SITE_OTHER): Payer: Self-pay | Admitting: Ophthalmology

## 2021-01-22 ENCOUNTER — Other Ambulatory Visit: Payer: Self-pay

## 2021-01-22 ENCOUNTER — Encounter (INDEPENDENT_AMBULATORY_CARE_PROVIDER_SITE_OTHER): Payer: BC Managed Care – PPO | Admitting: Ophthalmology

## 2021-01-22 ENCOUNTER — Ambulatory Visit (INDEPENDENT_AMBULATORY_CARE_PROVIDER_SITE_OTHER): Payer: BC Managed Care – PPO | Admitting: Ophthalmology

## 2021-01-22 DIAGNOSIS — E113293 Type 2 diabetes mellitus with mild nonproliferative diabetic retinopathy without macular edema, bilateral: Secondary | ICD-10-CM | POA: Diagnosis not present

## 2021-01-22 DIAGNOSIS — H35033 Hypertensive retinopathy, bilateral: Secondary | ICD-10-CM | POA: Diagnosis not present

## 2021-01-22 DIAGNOSIS — H35063 Retinal vasculitis, bilateral: Secondary | ICD-10-CM

## 2021-01-22 DIAGNOSIS — H3581 Retinal edema: Secondary | ICD-10-CM | POA: Diagnosis not present

## 2021-01-22 MED ORDER — FLUORESCEIN SODIUM 10 % IV SOLN
500.0000 mg | INTRAVENOUS | Status: AC | PRN
  Administered 2021-01-22: 500 mg via INTRAVENOUS

## 2021-01-22 NOTE — Assessment & Plan Note (Signed)
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  Patient however complains not to have known diabetes except in recent year or so

## 2021-01-22 NOTE — Assessment & Plan Note (Signed)
OU, overall posterior pole mild operative diabetic retinopathy remains Stable apparent however in the far retinal peripherally anteriorly there are regions of retinal nonperfusion.  This will need fluorescein angiography to study and thus we will schedule this.  In the meantime I will asked the patient have QuantiFERON gold antibody testing for TB just to look for signs of latent tuberculosis as the condition known as Eales disease can have this appearance

## 2021-01-22 NOTE — Progress Notes (Signed)
  01/22/2021     CHIEF COMPLAINT Patient presents for  Chief Complaint  Patient presents with   Retina Follow Up      HISTORY OF PRESENT ILLNESS: Kevin Jarvis is a 55 y.o. male who presents to the clinic today for:   HPI     Retina Follow Up   Patient presents with  Other.  In both eyes.  This started 8 weeks ago.  Severity is mild.  Duration of 8 weeks.  Since onset it is gradually improving.        Comments   8 week fu OU oct fp. "Before I could barely see, it was blurry. I could not see on the dashboard, but now I can see a lot better before I starting seeing Dr. Rankin. My sugar level went up then I started to do better and started walking and my levels were normal when I saw my doctor, 2 weeks ago. My doctor took me off the insulin."      Last edited by Kronstein, Anna N on 01/22/2021  1:21 PM.      Referring physician: Greene, Jeffrey R, MD [address] [place],  [place] [address]  HISTORICAL INFORMATION:   Selected notes from the medical record:     Lab Results  Component Value Date   HGBA1C 6.1 (A) 01/12/2021     CURRENT MEDICATIONS: No current outpatient medications on file. (Ophthalmic Drugs)   No current facility-administered medications for this visit. (Ophthalmic Drugs)   Current Outpatient Medications (Other)  Medication Sig   ACCU-CHEK GUIDE test strip USE 1 STRIP TO CHECK GLUCOSE TWICE DAILY FASTING OR 2 HOURS AFTER MEALS   Accu-Chek Softclix Lancets lancets 2 (two) times daily.   amLODipine (NORVASC) 10 MG tablet Take 1 tablet (10 mg total) by mouth daily.   blood glucose meter kit and supplies Dispense based on patient and insurance preference. Use 2 times daily - fasting or 2 hours after meals.   chlorthalidone (HYGROTON) 25 MG tablet Take 1 tablet (25 mg total) by mouth daily.   hydrALAZINE (APRESOLINE) 25 MG tablet Take 1 tablet (25 mg total) by mouth 3 (three) times daily.   Insulin Pen Needle (PEN NEEDLES) 32G X 4 MM MISC  Use with solostar pen daily.   metFORMIN (GLUCOPHAGE) 500 MG tablet Take 1 tablet (500 mg total) by mouth 2 (two) times daily with a meal. Start QD for first few days, increase to BID if tolerated.   spironolactone (ALDACTONE) 25 MG tablet Take 1 tablet (25 mg total) by mouth daily.   valsartan (DIOVAN) 320 MG tablet Take 1 tablet (320 mg total) by mouth daily.   No current facility-administered medications for this visit. (Other)      REVIEW OF SYSTEMS:    ALLERGIES Allergies  Allergen Reactions   Chlorthalidone Other (See Comments)    lightheaded    PAST MEDICAL HISTORY Past Medical History:  Diagnosis Date   Anxiety    Arthritis    Hypertension    History reviewed. No pertinent surgical history.  FAMILY HISTORY History reviewed. No pertinent family history.  SOCIAL HISTORY Social History   Tobacco Use   Smoking status: Never   Smokeless tobacco: Never  Substance Use Topics   Alcohol use: Yes   Drug use: No         OPHTHALMIC EXAM:  Base Eye Exam     Visual Acuity (ETDRS)       Right Left   Dist cc   20/20 -1 20/20    Correction: Glasses         Tonometry (Tonopen, 1:25 PM)       Right Left   Pressure 13 14         Pupils       Pupils Dark Light Shape React APD   Right PERRL 4 3 Round Brisk None   Left PERRL 4 3 Round Brisk None         Extraocular Movement       Right Left    Full Full         Neuro/Psych     Oriented x3: Yes   Mood/Affect: Normal         Dilation     Both eyes: 1.0% Mydriacyl, 2.5% Phenylephrine @ 1:25 PM           Slit Lamp and Fundus Exam     External Exam       Right Left   External Normal Normal         Slit Lamp Exam       Right Left   Lids/Lashes Normal Normal   Conjunctiva/Sclera White and quiet White and quiet   Cornea Clear Clear   Anterior Chamber Deep and quiet Deep and quiet   Iris Round and reactive Round and reactive   Lens Clear Clear   Anterior Vitreous Normal  Normal         Fundus Exam       Right Left   Posterior Vitreous Normal Normal   Disc Normal Normal   C/D Ratio 0.25 0.25   Macula Normal Normal   Vessels Multiple cotton-wool spots in the posterior pole, no definite specific diabetic retinopathy noted. Multiple cotton-wool spots in the posterior pole   Periphery In the regions in the anterior periphery between 730 and 930 areas of white lines, retinal nonperfusion In the regions in the anterior periphery between 2 30-3 30 areas of white lines, retinal nonperfusion            IMAGING AND PROCEDURES  Imaging and Procedures for 01/22/21  Color Fundus Photography Optos - OU - Both Eyes       Right Eye Disc findings include normal observations. Vessels : attenuated.   Left Eye Progression has no prior data. Disc findings include normal observations. Vessels : attenuated.   Notes Cotton-wool spot along the superotemporal arcade OS and superotemporal to the nerve OD.  These findings in addition to attenuated retinal artery changes as well as AV crossing changes Are highly specific for the presence of hypertensive retinopathy .  This diagnosis was was made before inquiring whether the patient had high blood pressure And at the level of its control.  OS, rake like defects in the superotemporal RNFL.  This does suggest previous damage from hypertensive retinopathy and/or, early glaucoma suspect.  OU notably large region of retinal nonperfusion, retinal "white lines of nonperfusion" temporal periphery OU centered in the temporal watershed regions of the retina     OCT, Retina - OU - Both Eyes       Right Eye Quality was good. Scan locations included subfoveal. Central Foveal Thickness: 248. Progression has been stable. Findings include normal foveal contour, vitreomacular adhesion .   Left Eye Quality was good. Scan locations included subfoveal. Central Foveal Thickness: 261. Progression has been stable. Findings include normal  foveal contour, vitreomacular adhesion .   Notes No pathologic findings by OCT no active maculopathy     Fluorescein  Angiography Optos (Transit OS)       Injection: 500 mg Fluorescein Sodium 10 %   Route: Intravenous   NDC: [phone]   Right Eye   Progression has no prior data. Mid/Late phase findings include normal observations, vascular perfusion defect. Choroidal neovascularization is not present.   Left Eye   Progression has no prior data. Early phase findings include normal observations, vascular perfusion defect. Mid/Late phase findings include normal observations, vascular perfusion defect. Choroidal neovascularization is not present.   Notes OS with normal posterior pole vascular perfusion on fluorescein angiogram However there are regions of far temporal periphery of retinal vascular nonperfusion yet with no neovascularization only mild Vessel wall staining.    OD similar findings exist temporally, with also mild vascular wall staining             ASSESSMENT/PLAN:  Retinal edema of both eyes Multiple cotton-wool spots OD have subsided particularly large and superotemporal to the nerve From prior 8 weeks ago however there is a new 1 that smaller and directly at the 12 o'clock position relative to the optic nerve, this does suggest potential superimposed hypertensive change  Also careful inspection of the peripheral retina however also discloses peripheral retinal nonperfusion in the temporal window between the 8 and 10 o'clock position OD 230 and 4 o'clock position temporally OS  This can be seen in Eales disease but also can be seen and otherwise occult diabetic retinopathy.  Patient however complains not to have known diabetes except in recent year or so  Mild nonproliferative diabetic retinopathy of both eyes ([place]) OU, overall posterior pole mild operative diabetic retinopathy remains Stable apparent however in the far retinal peripherally anteriorly  there are regions of retinal nonperfusion.  This will need fluorescein angiography to study and thus we will schedule this.  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 Hypertensive retinopathy of both eyes  H35.033 Color Fundus Photography Optos - OU - Both Eyes    OCT, Retina - OU - Both Eyes    3. Retinal edema of both eyes  H35.81     4. 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  2.  In the meantime however I will ask for QuantiFERON gold antibody assay for TB to rule out latent or asymptomatic prior disease   3.  Patient does deny having the BCG vaccine from his country of origin, [place]  Ophthalmic Meds Ordered this visit:  Meds ordered this encounter  Medications   Fluorescein Sodium 10 % injection 500 mg  Return in about 1 week (around 01/29/2021) for  , dilate, OS, PRP, temporal periphery local.  There are no Patient Instructions on file for this visit.   Explained the diagnoses, plan, and follow up with the patient and  they expressed understanding.  Patient expressed understanding of the importance of proper follow up care.   Gary A. Rankin M.D. Diseases & Surgery of the Retina and Vitreous [facility] 01/22/21     Abbreviations: M myopia (nearsighted); A astigmatism; H hyperopia (farsighted); P presbyopia; Mrx spectacle prescription;  CTL contact lenses; OD right eye; OS left eye; OU both eyes  XT exotropia; ET esotropia; PEK punctate epithelial keratitis; PEE punctate epithelial erosions; DES dry eye syndrome; MGD meibomian gland dysfunction; ATs artificial tears; PFAT's preservative free artificial tears; NSC nuclear sclerotic cataract; PSC posterior subcapsular cataract; ERM epi-retinal membrane; PVD posterior vitreous detachment; RD retinal detachment; DM diabetes mellitus; DR diabetic retinopathy; NPDR non-proliferative diabetic retinopathy; PDR proliferative diabetic retinopathy; CSME clinically significant macular edema; DME diabetic macular edema; dbh dot blot hemorrhages; CWS cotton wool spot; POAG primary open angle glaucoma; C/D cup-to-disc ratio; HVF humphrey visual field; GVF goldmann visual field; OCT optical coherence tomography; IOP intraocular pressure; BRVO Branch retinal vein occlusion; CRVO central retinal vein occlusion; CRAO central retinal artery occlusion; BRAO branch retinal artery occlusion; RT retinal tear; SB scleral buckle; PPV pars plana vitrectomy; VH Vitreous hemorrhage; PRP panretinal laser photocoagulation; IVK intravitreal kenalog; VMT vitreomacular traction; MH Macular hole;  NVD neovascularization of the disc; NVE neovascularization elsewhere; AREDS age related eye disease study; ARMD age related macular degeneration; POAG primary open angle glaucoma; EBMD epithelial/anterior basement membrane dystrophy; ACIOL anterior chamber intraocular lens; IOL intraocular lens; PCIOL posterior chamber intraocular lens; Phaco/IOL phacoemulsification with intraocular lens  placement; PRK photorefractive keratectomy; LASIK laser assisted in situ keratomileusis; HTN hypertension; DM diabetes mellitus; COPD chronic obstructive pulmonary disease  

## 2021-01-26 DIAGNOSIS — H35063 Retinal vasculitis, bilateral: Secondary | ICD-10-CM | POA: Diagnosis not present

## 2021-02-04 LAB — QUANTIFERON-TB GOLD PLUS
QuantiFERON Mitogen Value: >10 IU/mL
QuantiFERON Nil Value: 0.53 IU/mL
QuantiFERON TB1 Ag Value: >10 IU/mL
QuantiFERON TB2 Ag Value: >10 IU/mL
QuantiFERON-TB Gold Plus: POSITIVE — AB

## 2021-02-05 ENCOUNTER — Ambulatory Visit (INDEPENDENT_AMBULATORY_CARE_PROVIDER_SITE_OTHER): Payer: BC Managed Care – PPO | Admitting: Ophthalmology

## 2021-02-05 ENCOUNTER — Encounter (INDEPENDENT_AMBULATORY_CARE_PROVIDER_SITE_OTHER): Payer: BC Managed Care – PPO | Admitting: Ophthalmology

## 2021-02-05 ENCOUNTER — Other Ambulatory Visit: Payer: Self-pay

## 2021-02-05 ENCOUNTER — Encounter (INDEPENDENT_AMBULATORY_CARE_PROVIDER_SITE_OTHER): Payer: Self-pay | Admitting: Ophthalmology

## 2021-02-05 DIAGNOSIS — H35062 Retinal vasculitis, left eye: Secondary | ICD-10-CM | POA: Diagnosis not present

## 2021-02-05 DIAGNOSIS — H35061 Retinal vasculitis, right eye: Secondary | ICD-10-CM

## 2021-02-05 DIAGNOSIS — H35063 Retinal vasculitis, bilateral: Secondary | ICD-10-CM

## 2021-02-05 NOTE — Progress Notes (Signed)
02/05/2021     CHIEF COMPLAINT Patient presents for  Chief Complaint  Patient presents with   Retina Follow Up    Local PRP delivery to a large region of avascular retina temporally left eye, occlusive white lines possible prior vasculitis or Eales disease, with retinal nonperfusion leading to possible neovascularization  HISTORY OF PRESENT ILLNESS: Kevin Jarvis is a 55 y.o. male who presents to the clinic today for:   HPI     Retina Follow Up   Patient presents with  Other.  In both eyes.  This started 2 weeks ago.  Severity is mild.  Duration of 2 weeks.  Since onset it is gradually improving.        Comments   2 weeks PRP OS. Pt states his vision seems a little better. Denies new FOL or floaters.      Last edited by Laurin Coder on 02/05/2021  9:30 AM.      Referring physician: Wendie Agreste, MD [address] [place],  [place] [address]  HISTORICAL INFORMATION:   Selected notes from the MEDICAL RECORD NUMBER    Lab Results  Component Value Date   HGBA1C 6.1 (A) 01/12/2021     CURRENT MEDICATIONS: No current outpatient medications on file. (Ophthalmic Drugs)   No current facility-administered medications for this visit. (Ophthalmic Drugs)   Current Outpatient Medications (Other)  Medication Sig   ACCU-CHEK GUIDE test strip USE 1 STRIP TO CHECK GLUCOSE TWICE DAILY FASTING OR 2 HOURS AFTER MEALS   Accu-Chek Softclix Lancets lancets 2 (two) times daily.   amLODipine (NORVASC) 10 MG tablet Take 1 tablet (10 mg total) by mouth daily.   blood glucose meter kit and supplies Dispense based on patient and insurance preference. Use 2 times daily - fasting or 2 hours after meals.   chlorthalidone (HYGROTON) 25 MG tablet Take 1 tablet (25 mg total) by mouth daily.   hydrALAZINE (APRESOLINE) 25 MG tablet Take 1 tablet (25 mg total) by mouth 3 (three) times daily.   Insulin Pen Needle (PEN NEEDLES) 32G X 4 MM MISC Use with solostar pen daily.   metFORMIN  (GLUCOPHAGE) 500 MG tablet Take 1 tablet (500 mg total) by mouth 2 (two) times daily with a meal. Start QD for first few days, increase to BID if tolerated.   spironolactone (ALDACTONE) 25 MG tablet Take 1 tablet (25 mg total) by mouth daily.   valsartan (DIOVAN) 320 MG tablet Take 1 tablet (320 mg total) by mouth daily.   No current facility-administered medications for this visit. (Other)      REVIEW OF SYSTEMS:    ALLERGIES Allergies  Allergen Reactions   Chlorthalidone Other (See Comments)    lightheaded    PAST MEDICAL HISTORY Past Medical History:  Diagnosis Date   Anxiety    Arthritis    Hypertension    History reviewed. No pertinent surgical history.  FAMILY HISTORY History reviewed. No pertinent family history.  SOCIAL HISTORY Social History   Tobacco Use   Smoking status: Never   Smokeless tobacco: Never  Substance Use Topics   Alcohol use: Yes   Drug use: No         OPHTHALMIC EXAM:  Base Eye Exam     Visual Acuity (ETDRS)       Right Left   Dist cc 20/25 20/20    Correction: Glasses         Tonometry (Tonopen, 9:34 AM)  Right Left   Pressure 14 15         Pupils       Pupils Dark Light APD   Right PERRL 4 3 None   Left PERRL 4 3 None         Extraocular Movement       Right Left    Full Full         Neuro/Psych     Oriented x3: Yes   Mood/Affect: Normal         Dilation     Left eye: 1.0% Mydriacyl, 2.5% Phenylephrine @ 9:34 AM           Slit Lamp and Fundus Exam     External Exam       Right Left   External Normal Normal         Slit Lamp Exam       Right Left   Lids/Lashes Normal Normal   Conjunctiva/Sclera White and quiet White and quiet   Cornea Clear Clear   Anterior Chamber Deep and quiet Deep and quiet   Iris Round and reactive Round and reactive   Lens Clear Clear   Anterior Vitreous Normal Normal         Fundus Exam       Right Left   Posterior Vitreous  Normal    Disc  Normal   C/D Ratio  0.25   Macula  Normal   Vessels  Multiple cotton-wool spots in the posterior pole   Periphery  In the regions in the anterior periphery between 2 30-3 30 areas of white lines, retinal nonperfusion            IMAGING AND PROCEDURES  Imaging and Procedures for 02/05/21  Panretinal Photocoagulation - OS - Left Eye       Time Out Confirmed correct patient, procedure, site, and patient consented.   Anesthesia Topical anesthesia was used. Anesthetic medications included Proparacaine 0.5%.   Laser Information The type of laser was diode. Color was yellow. The duration in seconds was 0.03. The spot size was 390 microns. Laser power was 220. Total spots was 117.   Post-op The patient tolerated the procedure well. There were no complications. The patient received written and verbal post procedure care education.   Notes Local prp delivered temporally, region of retinal nonperfusion, "white lines"             ASSESSMENT/PLAN:  Eales disease of right eye Schedule local PRP limited into the region of retinal nonperfusion      ICD-10-CM   1. Eales disease, left  H35.062 Panretinal Photocoagulation - OS - Left Eye    2. Eales disease of both eyes  H35.063     3. Eales disease of right eye  H35.061       1.  2.  3.  Ophthalmic Meds Ordered this visit:  No orders of the defined types were placed in this encounter.      Return in about 1 week (around 02/12/2021) for dilate, OD, PRP, local PRP temporal bone.  There are no Patient Instructions on file for this visit.   Explained the diagnoses, plan, and follow up with the patient and they expressed understanding.  Patient expressed understanding of the importance of proper follow up care.   Clent Demark Evadene Wardrip M.D. Diseases & Surgery of the Retina and Vitreous [facility] 02/05/21     Abbreviations: M myopia (nearsighted); A astigmatism; H hyperopia (farsighted); P  presbyopia; Mrx spectacle prescription;  CTL contact lenses; OD right eye; OS left eye; OU both eyes  XT exotropia; ET esotropia; PEK punctate epithelial keratitis; PEE punctate epithelial erosions; DES dry eye syndrome; MGD meibomian gland dysfunction; ATs artificial tears; PFAT's preservative free artificial tears; [place] nuclear sclerotic cataract; PSC posterior subcapsular cataract; ERM epi-retinal membrane; PVD posterior vitreous detachment; RD retinal detachment; DM diabetes mellitus; DR diabetic retinopathy; NPDR non-proliferative diabetic retinopathy; PDR proliferative diabetic retinopathy; CSME clinically significant macular edema; DME diabetic macular edema; dbh dot blot hemorrhages; CWS cotton wool spot; POAG primary open angle glaucoma; C/D cup-to-disc ratio; HVF humphrey visual field; GVF goldmann visual field; OCT optical coherence tomography; IOP intraocular pressure; BRVO Branch retinal vein occlusion; CRVO central retinal vein occlusion; CRAO central retinal artery occlusion; BRAO branch retinal artery occlusion; RT retinal tear; SB scleral buckle; PPV pars plana vitrectomy; VH Vitreous hemorrhage; PRP panretinal laser photocoagulation; IVK intravitreal kenalog; VMT vitreomacular traction; MH Macular hole;  NVD neovascularization of the disc; NVE neovascularization elsewhere; AREDS age related eye disease study; ARMD age related macular degeneration; POAG primary open angle glaucoma; EBMD epithelial/anterior basement membrane dystrophy; ACIOL anterior chamber intraocular lens; IOL intraocular lens; PCIOL posterior chamber intraocular lens; Phaco/IOL phacoemulsification with intraocular lens placement; [place] photorefractive keratectomy; LASIK laser assisted in situ keratomileusis; HTN hypertension; DM diabetes mellitus; COPD chronic obstructive pulmonary disease

## 2021-02-05 NOTE — Assessment & Plan Note (Signed)
Schedule local PRP limited into the region of retinal nonperfusion

## 2021-02-12 ENCOUNTER — Encounter (INDEPENDENT_AMBULATORY_CARE_PROVIDER_SITE_OTHER): Payer: BC Managed Care – PPO | Admitting: Ophthalmology

## 2021-02-14 ENCOUNTER — Ambulatory Visit (INDEPENDENT_AMBULATORY_CARE_PROVIDER_SITE_OTHER): Payer: BC Managed Care – PPO | Admitting: Ophthalmology

## 2021-02-14 ENCOUNTER — Other Ambulatory Visit: Payer: Self-pay

## 2021-02-14 ENCOUNTER — Encounter (INDEPENDENT_AMBULATORY_CARE_PROVIDER_SITE_OTHER): Payer: Self-pay | Admitting: Ophthalmology

## 2021-02-14 DIAGNOSIS — H35061 Retinal vasculitis, right eye: Secondary | ICD-10-CM

## 2021-02-14 NOTE — Assessment & Plan Note (Signed)
Peripheral area of nonperfusion treated with grid laser photocoagulation roughly from 7 30-8 position 210 position temporally very anterior

## 2021-02-14 NOTE — Progress Notes (Signed)
02/14/2021     CHIEF COMPLAINT Patient presents for  Chief Complaint  Patient presents with   Retina Follow Up      HISTORY OF PRESENT ILLNESS: Kevin Jarvis is a 55 y.o. male who presents to the clinic today for:   HPI     Retina Follow Up   Patient presents with  Other.  In right eye.  This started 1 week ago.  Severity is mild.  Duration of 1 week.  Since onset it is gradually improving.        Comments   1 week PRP OD.  Pt states vision has improved slightly in the left eye. Pt he is seeing a little bit of floaters. Pt had PRP OS 02/05/21.  Pt asked "is there any specific eyedrops that I should be using for dry eyes and when I wake up from sleep it is hard to see, it is blurred and the sleepy eye is there."       Last edited by Laurin Coder on 02/14/2021  9:41 AM.      Referring physician: Wendie Agreste, MD [id number] [place],  [place] [id number]  HISTORICAL INFORMATION:   Selected notes from the MEDICAL RECORD NUMBER    Lab Results  Component Value Date   HGBA1C 6.1 (A) 01/12/2021     CURRENT MEDICATIONS: No current outpatient medications on file. (Ophthalmic Drugs)   No current facility-administered medications for this visit. (Ophthalmic Drugs)   Current Outpatient Medications (Other)  Medication Sig   ACCU-CHEK GUIDE test strip USE 1 STRIP TO CHECK GLUCOSE TWICE DAILY FASTING OR 2 HOURS AFTER MEALS   Accu-Chek Softclix Lancets lancets 2 (two) times daily.   amLODipine (NORVASC) 10 MG tablet Take 1 tablet (10 mg total) by mouth daily.   blood glucose meter kit and supplies Dispense based on patient and insurance preference. Use 2 times daily - fasting or 2 hours after meals.   chlorthalidone (HYGROTON) 25 MG tablet Take 1 tablet (25 mg total) by mouth daily.   hydrALAZINE (APRESOLINE) 25 MG tablet Take 1 tablet (25 mg total) by mouth 3 (three) times daily.   Insulin Pen Needle (PEN NEEDLES) 32G X 4 MM MISC Use with solostar pen  daily.   metFORMIN (GLUCOPHAGE) 500 MG tablet Take 1 tablet (500 mg total) by mouth 2 (two) times daily with a meal. Start QD for first few days, increase to BID if tolerated.   spironolactone (ALDACTONE) 25 MG tablet Take 1 tablet (25 mg total) by mouth daily.   valsartan (DIOVAN) 320 MG tablet Take 1 tablet (320 mg total) by mouth daily.   No current facility-administered medications for this visit. (Other)      REVIEW OF SYSTEMS:    ALLERGIES Allergies  Allergen Reactions   Chlorthalidone Other (See Comments)    lightheaded    PAST MEDICAL HISTORY Past Medical History:  Diagnosis Date   Anxiety    Arthritis    Hypertension    History reviewed. No pertinent surgical history.  FAMILY HISTORY History reviewed. No pertinent family history.  SOCIAL HISTORY Social History   Tobacco Use   Smoking status: Never   Smokeless tobacco: Never  Substance Use Topics   Alcohol use: Yes   Drug use: No         OPHTHALMIC EXAM:  Base Eye Exam     Visual Acuity (ETDRS)       Right Left   Dist cc 20/25 -1  20/20         Tonometry (Tonopen, 9:45 AM)       Right Left   Pressure 18 11         Pupils       Pupils Dark Light Shape React APD   Right PERRL 4 3 Round Brisk None   Left PERRL 4 3 Round Brisk None         Extraocular Movement       Right Left    Full Full         Neuro/Psych     Oriented x3: Yes   Mood/Affect: Normal         Dilation     Right eye: 1.0% Mydriacyl, 2.5% Phenylephrine @ 9:45 AM           Slit Lamp and Fundus Exam     External Exam       Right Left   External Normal Normal         Slit Lamp Exam       Right Left   Lids/Lashes Normal Normal   Conjunctiva/Sclera White and quiet White and quiet   Cornea Clear Clear   Anterior Chamber Deep and quiet Deep and quiet   Iris Round and reactive Round and reactive   Lens Clear Clear   Anterior Vitreous Normal Normal         Fundus Exam       Right  Left   Posterior Vitreous Normal    Disc Normal    C/D Ratio 0.25    Macula Normal    Vessels Multiple cotton-wool spots in the posterior pole, no definite specific diabetic retinopathy noted.    Periphery In the regions in the anterior periphery between 730 and 930 areas of white lines, retinal nonperfusion             IMAGING AND PROCEDURES  Imaging and Procedures for 02/14/21  Panretinal Photocoagulation - OD - Right Eye       Time Out Confirmed correct patient, procedure, site, and patient consented.   Anesthesia Topical anesthesia was used. Anesthetic medications included Proparacaine 0.5%.   Laser Information The type of laser was diode. Color was yellow. The duration in seconds was 0.03. The spot size was 390 microns. Laser power was 220. Total spots was 135.   Post-op The patient tolerated the procedure well. There were no complications. The patient received written and verbal post procedure care education.   Notes Peripheral anterior sector local PRP, using a grid pattern, and region from approximately 8-10 position very anterior             ASSESSMENT/PLAN:  Eales disease of right eye Peripheral area of nonperfusion treated with grid laser photocoagulation roughly from 7 30-8 position 210 position temporally very anterior     ICD-10-CM   1. Eales disease of right eye  H35.061 Panretinal Photocoagulation - OD - Right Eye      1.  OD, peripheral anterior PRP to the region of retinal nonperfusion.  2.  3.  Ophthalmic Meds Ordered this visit:  No orders of the defined types were placed in this encounter.      Return in about 4 months (around 06/14/2021) for DILATE OU, COLOR FP for dilation.  There are no Patient Instructions on file for this visit.   Explained the diagnoses, plan, and follow up with the patient and they expressed understanding.  Patient expressed understanding of the importance of proper follow up care.  Clent Demark Aureliano Oshields  M.D. Diseases & Surgery of the Retina and Vitreous Retina [facility] Diabetic [place] 02/14/21     Abbreviations: M myopia (nearsighted); A astigmatism; H hyperopia (farsighted); P presbyopia; Mrx spectacle prescription;  CTL contact lenses; OD right eye; OS left eye; OU both eyes  XT exotropia; ET esotropia; PEK punctate epithelial keratitis; PEE punctate epithelial erosions; DES dry eye syndrome; MGD meibomian gland dysfunction; ATs artificial tears; PFAT's preservative free artificial tears; [place] nuclear sclerotic cataract; PSC posterior subcapsular cataract; ERM epi-retinal membrane; PVD posterior vitreous detachment; RD retinal detachment; DM diabetes mellitus; DR diabetic retinopathy; NPDR non-proliferative diabetic retinopathy; PDR proliferative diabetic retinopathy; CSME clinically significant macular edema; DME diabetic macular edema; dbh dot blot hemorrhages; CWS cotton wool spot; POAG primary open angle glaucoma; C/D cup-to-disc ratio; HVF humphrey visual field; GVF goldmann visual field; OCT optical coherence tomography; IOP intraocular pressure; BRVO Branch retinal vein occlusion; CRVO central retinal vein occlusion; CRAO central retinal artery occlusion; BRAO branch retinal artery occlusion; RT retinal tear; SB scleral buckle; PPV pars plana vitrectomy; VH Vitreous hemorrhage; PRP panretinal laser photocoagulation; IVK intravitreal kenalog; VMT vitreomacular traction; MH Macular hole;  NVD neovascularization of the disc; NVE neovascularization elsewhere; AREDS age related eye disease study; ARMD age related macular degeneration; POAG primary open angle glaucoma; EBMD epithelial/anterior basement membrane dystrophy; ACIOL anterior chamber intraocular lens; IOL intraocular lens; PCIOL posterior chamber intraocular lens; Phaco/IOL phacoemulsification with intraocular lens placement; [place] photorefractive keratectomy; LASIK laser assisted in situ keratomileusis; HTN hypertension; DM diabetes mellitus; COPD  chronic obstructive pulmonary disease

## 2021-02-15 LAB — HM DIABETES EYE EXAM

## 2021-03-06 ENCOUNTER — Encounter: Payer: BC Managed Care – PPO | Attending: Family Medicine | Admitting: Dietician

## 2021-03-06 DIAGNOSIS — E1165 Type 2 diabetes mellitus with hyperglycemia: Secondary | ICD-10-CM | POA: Insufficient documentation

## 2021-03-06 DIAGNOSIS — Z794 Long term (current) use of insulin: Secondary | ICD-10-CM | POA: Insufficient documentation

## 2021-04-13 ENCOUNTER — Ambulatory Visit (INDEPENDENT_AMBULATORY_CARE_PROVIDER_SITE_OTHER): Payer: BC Managed Care – PPO | Admitting: Family Medicine

## 2021-04-13 VITALS — BP 126/78 | HR 69 | Temp 98.0°F | Resp 16 | Ht 69.0 in | Wt 213.2 lb

## 2021-04-13 DIAGNOSIS — Z794 Long term (current) use of insulin: Secondary | ICD-10-CM

## 2021-04-13 DIAGNOSIS — N529 Male erectile dysfunction, unspecified: Secondary | ICD-10-CM

## 2021-04-13 DIAGNOSIS — E1165 Type 2 diabetes mellitus with hyperglycemia: Secondary | ICD-10-CM | POA: Diagnosis not present

## 2021-04-13 MED ORDER — SILDENAFIL CITRATE 50 MG PO TABS: 25.0000 mg | ORAL_TABLET | Freq: Every day | ORAL | 5 refills | Status: DC | PRN

## 2021-04-13 NOTE — Progress Notes (Signed)
Subjective:  Patient ID: Kevin Jarvis, male    DOB: 1966-01-15  Age: 55 y.o. MRN: [id number]  CC:  Chief Complaint  Patient presents with   Diabetes    Pt reports here for recheck no concerns doing well     HPI Kevin Jarvis presents for  Diabetes: New onset July 2022 with hyperglycemia.  Treated with Lantus, metformin, nutrition eval, prior ophthalmology eval in August with hypertensive retinopathy.  Was taking Lantus 14 units/day, metformin 500 mg twice daily, on ARB at his last visit in October.  Significantly improved A1c.with meds, diet, exercise. No symptomatic lows, lowest 91 at that time. Still watching diet, exercise. Few weeks of less exercise over holidays.  Stopped insulin in October.  Remains on 570m metformin BID. Occasional stomach noise, no diarrhea. home readings: Fasting: low 100, PP: 110 Lowest 89, no sx lows.   Microalbumin: nl ratio in 09/2020.  Optho, foot exam, pneumovax: utd.  Followed by optho - Eales disease.   Wt Readings from Last 3 Encounters:  04/13/21 213 lb 3.2 oz (96.7 kg)  01/12/21 207 lb 12.8 oz (94.3 kg)  01/03/21 209 lb 4.8 oz (94.9 kg)    Lab Results  Component Value Date   HGBA1C 6.1 (A) 01/12/2021   HGBA1C 16.3 (H) 10/17/2020   HGBA1C 16.2 (H) 10/06/2020   Lab Results  Component Value Date   MICROALBUR 1.1 10/12/2020   [place] 01/12/2021   CREATININE 1.21 01/12/2021   Erectile dysfunction: Able to achieve erection, but some difficulty with maintaining and tumescence. Past year or more.  Affecting sexual activity with wife. Some relief with otc "power honey".  No cp with exertion. Would like to try med - risks discussed.   History Patient Active Problem List   Diagnosis Date Noted   Eales disease, left 02/05/2021   Eales disease of right eye 02/05/2021   Eales disease of both eyes 01/22/2021   Hypertensive retinopathy of both eyes 11/27/2020   Retinal edema of both eyes 11/27/2020   Mild nonproliferative diabetic  retinopathy of both eyes ([place] 11/27/2020   Type 2 diabetes mellitus with hyperglycemia ([facility] 10/26/2020   Iron deficiency anemia 06/07/2017   Anxiety 12/12/2011   Long term use of drug 12/12/2011   Hypertension 12/12/2011   Sleep disorder, 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 Allergies  Allergen Reactions   Chlorthalidone Other (See Comments)    lightheaded   Prior to Admission medications   Medication Sig Start Date End Date Taking? Authorizing Provider  ACCU-CHEK GUIDE test strip USE 1 STRIP TO CHECK GLUCOSE TWICE DAILY FASTING OR 2 HOURS AFTER MEALS 10/10/20  Yes [provider]  Accu-Chek Softclix Lancets lancets 2 (two) times daily. 10/10/20  Yes [provider]  amLODipine (NORVASC) 10 MG tablet Take 1 tablet (10 mg total) by mouth daily. 01/12/21  Yes GWendie Agreste MD  blood glucose meter kit and supplies Dispense based on patient and insurance preference. Use 2 times daily - fasting or 2 hours after meals. 10/06/20  Yes GWendie Agreste MD  Insulin Pen Needle (PEN NEEDLES) 32G X 4 MM MISC Use with solostar pen daily. 10/06/20  Yes GWendie Agreste MD  metFORMIN (GLUCOPHAGE) 500 MG tablet Take 1 tablet (500 mg total) by mouth 2 (two) times daily with a meal. Start QD for first  few days, increase to BID if tolerated. 10/06/20  Yes Wendie Agreste, MD  valsartan (DIOVAN) 320 MG tablet Take 1 tablet (320 mg total) by mouth daily. 09/22/20  Yes Cleaver, Jossie Ng, NP  chlorthalidone (HYGROTON) 25 MG tablet Take 1 tablet (25 mg total) by mouth daily. 08/22/20 11/20/20  Skeet Latch, MD  hydrALAZINE (APRESOLINE) 25 MG tablet Take 1 tablet (25 mg total) by mouth 3 (three) times daily. 10/13/20 01/11/21  Skeet Latch, MD  spironolactone (ALDACTONE) 25 MG tablet Take 1 tablet (25 mg total) by mouth daily. 03/06/20 06/04/20  O'Neal,  Cassie Freer, MD   Social History   Socioeconomic History   Marital status: Married    Spouse name: Not on file   Number of children: 2   Years of education: Not on file   Highest education level: Not on file  Occupational History   Occupation: Glass blower/designer  Tobacco Use   Smoking status: Never   Smokeless tobacco: Never  Substance and Sexual Activity   Alcohol use: Yes   Drug use: No   Sexual activity: Not on file  Other Topics Concern   Not on file  Social History Narrative   From [place] - Moved to [place] 2000   Lives with wife and son   Social Determinants of Health   Financial Resource Strain: Not on file  Food Insecurity: Not on file  Transportation Needs: Not on file  Physical Activity: Not on file  Stress: Not on file  Social Connections: Not on file  Intimate Partner Violence: Not on file    Review of Systems  Constitutional:  Negative for fatigue and unexpected weight change.  Eyes:  Negative for visual disturbance.  Respiratory:  Negative for cough, chest tightness and shortness of breath.   Cardiovascular:  Negative for chest pain, palpitations and leg swelling.  Gastrointestinal:  Negative for abdominal pain and blood in stool.  Neurological:  Negative for dizziness, light-headedness and headaches.    Objective:   Vitals:   04/13/21 1036  BP: 126/78  Pulse: 69  Resp: 16  Temp: 98 F (36.7 C)  TempSrc: Temporal  SpO2: 99%  Weight: 213 lb 3.2 oz (96.7 kg)  Height: 5' 9"  (1.753 m)     Physical Exam Vitals reviewed.  Constitutional:      Appearance: He is well-developed.  HENT:     Head: Normocephalic and atraumatic.  Neck:     Vascular: No carotid bruit or JVD.  Cardiovascular:     Rate and Rhythm: Normal rate and regular rhythm.     Heart sounds: Normal heart sounds. No murmur heard. Pulmonary:     Effort: Pulmonary effort is normal.     Breath sounds: Normal breath sounds. No rales.  Musculoskeletal:     Right lower leg: No  edema.     Left lower leg: No edema.  Skin:    General: Skin is warm and dry.  Neurological:     Mental Status: He is alert and oriented to person, place, and time.  Psychiatric:        Mood and Affect: Mood normal.        Behavior: Behavior normal.       Assessment & Plan:  Kevin Jarvis is a 56 y.o. male . Type 2 diabetes mellitus with hyperglycemia, with long-term current use of insulin (HCC) - Plan: Hemoglobin A1c  -Well-controlled off insulin and just metformin.  Commended on diet/exercise.  Some recent slight weight gain over the holidays but  has been doing very well otherwise.  Check A1c, continue same dose metformin for now.  65-monthfollow-up, and if stable can transition to 634-monthollow-ups.  Erectile dysfunction, unspecified erectile dysfunction type - Plan: sildenafil (VIAGRA) 50 MG tablet  -viagra Rx given - use lowest effective dose. Side effects discussed (including but not limited to headache/flushing, blue discoloration of vision, possible vascular steal and risk of cardiac effects if underlying unknown coronary artery disease, and permanent sensorineural hearing loss). Understanding expressed.  -with his health history consider urology follow-up if persistent symptoms to look into other treatment options or testing.   Meds ordered this encounter  Medications   sildenafil (VIAGRA) 50 MG tablet    Sig: Take 0.5-1 tablets (25-50 mg total) by mouth daily as needed for erectile dysfunction.    Dispense:  10 tablet    Refill:  5   Patient Instructions  Use lowest effective dose of sildenafil, start with 1/2 pill.  If any new side effects, stop it and follow-up to discuss further.  If symptoms not improving with that medication I would recommend evaluation with urology.  Check A1c today, if any concerns can discuss med changes.  Continue same dose metformin for now.  Keep up the good work with diet and exercise. Take care.     Signed,   JeMerri Ray MD [place] 04/13/21 11:27 AM

## 2021-04-13 NOTE — Patient Instructions (Addendum)
Use lowest effective dose of sildenafil, start with 1/2 pill.  If any new side effects, stop it and follow-up to discuss further.  If symptoms not improving with that medication I would recommend evaluation with urology.  Check A1c today, if any concerns can discuss med changes.  Continue same dose metformin for now.  Keep up the good work with diet and exercise. Take care.

## 2021-04-18 ENCOUNTER — Telehealth: Payer: Self-pay | Admitting: Family Medicine

## 2021-04-18 NOTE — Telephone Encounter (Signed)
Attempted call but rang and gave message of "call cannot be completed at this time"

## 2021-04-18 NOTE — Telephone Encounter (Signed)
Pt called in stating that the Sildenafil is not working. He wanted to know what the next steps are. He said that Dr. Carlota Raspberry said maybe a referral.  Please advise

## 2021-04-18 NOTE — Telephone Encounter (Signed)
Can try full 50mg  and if not effective up to 100mg  dose - watch for new side effects at that higher dose. If still not working at higher dose I can refer him to urology - let me know

## 2021-04-19 NOTE — Telephone Encounter (Signed)
Received message that number is not a working number.

## 2021-04-19 NOTE — Telephone Encounter (Signed)
Called again to discuss, pt phone rang then stated call cannot be completed at this time

## 2021-04-20 ENCOUNTER — Telehealth: Payer: Self-pay

## 2021-04-20 DIAGNOSIS — N529 Male erectile dysfunction, unspecified: Secondary | ICD-10-CM

## 2021-04-20 NOTE — Telephone Encounter (Signed)
Pt called back finally about sildenafil and reports he would rather just see urology than try the increase in dose, please advise if this is okay.

## 2021-04-20 NOTE — Telephone Encounter (Signed)
Referral placed.

## 2021-04-20 NOTE — Addendum Note (Signed)
Addended by: Merri Ray R on: 04/20/2021 03:58 PM   Modules accepted: Orders

## 2021-05-17 DIAGNOSIS — Z125 Encounter for screening for malignant neoplasm of prostate: Secondary | ICD-10-CM | POA: Diagnosis not present

## 2021-05-17 DIAGNOSIS — N5201 Erectile dysfunction due to arterial insufficiency: Secondary | ICD-10-CM | POA: Diagnosis not present

## 2021-05-17 DIAGNOSIS — N400 Enlarged prostate without lower urinary tract symptoms: Secondary | ICD-10-CM | POA: Diagnosis not present

## 2021-05-17 DIAGNOSIS — R351 Nocturia: Secondary | ICD-10-CM | POA: Diagnosis not present

## 2021-06-14 ENCOUNTER — Encounter (INDEPENDENT_AMBULATORY_CARE_PROVIDER_SITE_OTHER): Payer: BC Managed Care – PPO | Admitting: Ophthalmology

## 2021-06-27 DIAGNOSIS — N5201 Erectile dysfunction due to arterial insufficiency: Secondary | ICD-10-CM | POA: Diagnosis not present

## 2021-07-17 ENCOUNTER — Other Ambulatory Visit: Payer: Self-pay | Admitting: General Practice

## 2021-07-18 ENCOUNTER — Ambulatory Visit: Payer: BC Managed Care – PPO | Admitting: Family Medicine

## 2021-07-19 NOTE — Telephone Encounter (Signed)
Rx(s) sent to pharmacy electronically.  

## 2021-08-03 ENCOUNTER — Ambulatory Visit (INDEPENDENT_AMBULATORY_CARE_PROVIDER_SITE_OTHER): Payer: BC Managed Care – PPO | Admitting: Family Medicine

## 2021-08-03 VITALS — BP 160/88 | HR 84 | Temp 98.3°F | Resp 17 | Ht 69.0 in | Wt 214.2 lb

## 2021-08-03 DIAGNOSIS — I10 Essential (primary) hypertension: Secondary | ICD-10-CM

## 2021-08-03 DIAGNOSIS — E781 Pure hyperglyceridemia: Secondary | ICD-10-CM | POA: Diagnosis not present

## 2021-08-03 DIAGNOSIS — E1165 Type 2 diabetes mellitus with hyperglycemia: Secondary | ICD-10-CM | POA: Diagnosis not present

## 2021-08-03 DIAGNOSIS — Z1211 Encounter for screening for malignant neoplasm of colon: Secondary | ICD-10-CM

## 2021-08-03 DIAGNOSIS — Z794 Long term (current) use of insulin: Secondary | ICD-10-CM | POA: Diagnosis not present

## 2021-08-03 LAB — LIPID PANEL
Cholesterol: 121 mg/dL (ref 0–200)
HDL: 39.2 mg/dL (ref >39.00)
LDL Cholesterol: 72 mg/dL (ref 0–99)
NonHDL: 82.01
Total CHOL/HDL Ratio: 3
Triglycerides: 51 mg/dL (ref 0.0–149.0)
VLDL: 10.2 mg/dL (ref 0.0–40.0)

## 2021-08-03 LAB — POCT GLYCOSYLATED HEMOGLOBIN (HGB A1C): Hemoglobin A1C: 6.4 % — AB (ref 4.0–5.6)

## 2021-08-03 LAB — COMPREHENSIVE METABOLIC PANEL
ALT: 27 U/L (ref 0–53)
AST: 20 U/L (ref 0–37)
Albumin: 4.5 g/dL (ref 3.5–5.2)
Alkaline Phosphatase: 46 U/L (ref 39–117)
BUN: 19 mg/dL (ref 6–23)
CO2: 31 mEq/L (ref 19–32)
Calcium: 9.7 mg/dL (ref 8.4–10.5)
Chloride: 100 mEq/L (ref 96–112)
Creatinine, Ser: 1.07 mg/dL (ref 0.40–1.50)
GFR: 77.21 mL/min (ref >60.00)
Glucose, Bld: 106 mg/dL — ABNORMAL HIGH (ref 70–99)
Potassium: 3.5 mEq/L (ref 3.5–5.1)
Sodium: 138 mEq/L (ref 135–145)
Total Bilirubin: 0.5 mg/dL (ref 0.2–1.2)
Total Protein: 7.4 g/dL (ref 6.0–8.3)

## 2021-08-03 MED ORDER — METFORMIN HCL ER 500 MG PO TB24: 1000.0000 mg | ORAL_TABLET | Freq: Every day | ORAL | 1 refills | Status: DC

## 2021-08-03 MED ORDER — HYDRALAZINE HCL 25 MG PO TABS: 25.0000 mg | ORAL_TABLET | Freq: Three times a day (TID) | ORAL | 3 refills | Status: DC

## 2021-08-03 MED ORDER — AMLODIPINE BESYLATE 10 MG PO TABS: 10.0000 mg | ORAL_TABLET | Freq: Every day | ORAL | 3 refills | Status: DC

## 2021-08-03 MED ORDER — VALSARTAN 320 MG PO TABS: 320.0000 mg | ORAL_TABLET | Freq: Every day | ORAL | 3 refills | Status: DC

## 2021-08-03 MED ORDER — CHLORTHALIDONE 25 MG PO TABS: 25.0000 mg | ORAL_TABLET | Freq: Every day | ORAL | 3 refills | Status: DC

## 2021-08-03 MED ORDER — SPIRONOLACTONE 25 MG PO TABS: 25.0000 mg | ORAL_TABLET | Freq: Every day | ORAL | 3 refills | Status: DC

## 2021-08-03 NOTE — Progress Notes (Signed)
? ?Subjective:  ?Patient ID: Kevin Jarvis, male    DOB: 02/18/1966  Age: 56 y.o. MRN: [id number] ? ?CC:  ?Chief Complaint  ?Patient presents with  ? Diabetes  ?  Patient states he is here for his 3 month follow up on diabetes.  ? ? ?HPI ?Kevin Jarvis presents for  ? ?Diabetes: ?New onset July 2022.  Prior treatments Lantus, metformin, nutritional eval and ophthalmology eval in August with hypertensive retinopathy.  He is significantly improved his A1c with change in diet and exercise.  Insulin was discontinued last October.  As of his last visit in January he was only on metformin 500 mg twice daily with good control. ?We did start sildenafil last visit for ED. ?home readings fasting: 90-120 ?Home readings postprandial:none ?No symptomatic lows.  ?Avoiding sweets. Sometimes misses 2nd dose metformin (few times per week - may not be able to eat at time of dose).  ?Occasional exercise.  ?Microalbumin: Normal ratio 10/12/2020 ?Optho, foot exam, pneumovax: Up-to-date ?LDL last October normal.  Not currently on statin.  He is on ARB. ? ?Lab Results  ?Component Value Date  ? HGBA1C 6.4 (A) 08/03/2021  ? HGBA1C 6.1 (A) 01/12/2021  ? HGBA1C 16.3 (H) 10/17/2020  ? ?Lab Results  ?Component Value Date  ? MICROALBUR 1.1 10/12/2020  ? [place] 60 01/12/2021  ? CREATININE 1.21 01/12/2021  ? ?Hypertension: ?Treated with chlorthalidone 25 mg daily, spironolactone 25 mg daily, valsartan 320 mg daily, amlodipine 10 mg daily, hydralazine 25 mg 3 times daily (typically taking BID)  Previously followed by cardiology.  ?Rare missed doses - sometimes forgets med. Did not take meds yet today.  ?Home readings: 130-140/80.  ?BP Readings from Last 3 Encounters:  ?08/03/21 (!) 160/88  ?04/13/21 126/78  ?01/12/21 138/74  ? ?Lab Results  ?Component Value Date  ? CREATININE 1.21 01/12/2021  ? ?Lab Results  ?Component Value Date  ? CHOL 112 01/12/2021  ? HDL 42.90 01/12/2021  ? [place] 60 01/12/2021  ? TRIG 42.0 01/12/2021  ? CHOLHDL 3 01/12/2021   ? ?HM: ?Occasional soreness in stomach past month or two. No current pain.  ? ?History ?Patient Active Problem List  ? Diagnosis Date Noted  ? 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 Eales disease of right eye 02/05/2021  ? Eales disease of both eyes 01/22/2021  ? Hypertensive retinopathy of both eyes 11/27/2020  ? Retinal edema of both eyes 11/27/2020  ? Mild nonproliferative diabetic retinopathy of both eyes ([place]) 11/27/2020  ? Type 2 diabetes mellitus with hyperglycemia ([place]) 10/26/2020  ? Iron deficiency anemia 06/07/2017  ? Anxiety 12/12/2011  ? Long term use of drug 12/12/2011  ? Hypertension 12/12/2011  ? Sleep disorder, shift work 12/12/2011  ? Headache(784.0) 12/12/2011  ? ?Past Medical History:  ?Diagnosis Date  ? Anxiety   ? Arthritis   ? Hypertension   ? ?No past surgical history on file. ?Allergies  ?Allergen Reactions  ? Chlorthalidone Other (See Comments)  ?  lightheaded  ? ?Prior to Admission medications   ?Medication Sig Start Date End Date Taking? Authorizing Provider  ?ACCU-CHEK GUIDE test strip USE 1 STRIP TO CHECK GLUCOSE TWICE DAILY FASTING OR 2 HOURS AFTER MEALS 10/10/20  Yes [provider]  ?Accu-Chek Softclix Lancets lancets 2 (two) times daily. 10/10/20  Yes [provider]  ?amLODipine (NORVASC) 10 MG tablet Take 1 tablet (10 mg total) by mouth daily. 01/12/21  Yes Wendie Agreste, MD  ?blood glucose meter kit and supplies Dispense based on patient and  insurance preference. Use 2 times daily - fasting or 2 hours after meals. 10/06/20  Yes Wendie Agreste, MD  ?Insulin Pen Needle (PEN NEEDLES) 32G X 4 MM MISC Use with solostar pen daily. 10/06/20  Yes Wendie Agreste, MD  ?metFORMIN (GLUCOPHAGE) 500 MG tablet Take 1 tablet (500 mg total) by mouth 2 (two) times daily with a meal. Start QD for first few days, increase to BID if tolerated. 10/06/20  Yes Wendie Agreste, MD  ?sildenafil (VIAGRA) 50 MG tablet Take 0.5-1 tablets (25-50 mg total) by mouth daily as needed for erectile  dysfunction. 04/13/21  Yes Wendie Agreste, MD  ?valsartan (DIOVAN) 320 MG tablet Take 1 tablet (320 mg total) by mouth daily. NEED APPOINTMENT 07/19/21  Yes Skeet Latch, MD  ?chlorthalidone (HYGROTON) 25 MG tablet Take 1 tablet (25 mg total) by mouth daily. 08/22/20 11/20/20  Skeet Latch, MD  ?hydrALAZINE (APRESOLINE) 25 MG tablet Take 1 tablet (25 mg total) by mouth 3 (three) times daily. 10/13/20 01/11/21  Skeet Latch, MD  ?spironolactone (ALDACTONE) 25 MG tablet Take 1 tablet (25 mg total) by mouth daily. 03/06/20 06/04/20  Geralynn Rile, MD  ? ?Social History  ? ?Socioeconomic History  ? Marital status: Married  ?  Spouse name: Not on file  ? Number of children: 2  ? Years of education: Not on file  ? Highest education level: Not on file  ?Occupational History  ? Occupation: Glass blower/designer  ?Tobacco Use  ? Smoking status: Never  ? Smokeless tobacco: Never  ?Substance and Sexual Activity  ? Alcohol use: Yes  ? Drug use: No  ? Sexual activity: Not on file  ?Other Topics Concern  ? Not on file  ?Social History Narrative  ? From [place] - Moved to [place] 2000  ? Lives with wife and son  ? ?Social Determinants of Health  ? ?Financial Resource Strain: Not on file  ?Food Insecurity: Not on file  ?Transportation Needs: Not on file  ?Physical Activity: Not on file  ?Stress: Not on file  ?Social Connections: Not on file  ?Intimate Partner Violence: Not on file  ? ? ?Review of Systems  ?Constitutional:  Negative for fatigue and unexpected weight change.  ?Eyes:  Negative for visual disturbance.  ?Respiratory:  Negative for cough, chest tightness and shortness of breath.   ?Cardiovascular:  Negative for chest pain, palpitations and leg swelling.  ?Gastrointestinal:  Positive for abdominal pain (rare twinge of discomfort, no n/v/d or constipation. no fever.). Negative for blood in stool.  ?Neurological:  Negative for dizziness, light-headedness and headaches.  ? ? ?Objective:  ? ?Vitals:  ? 08/03/21 1058   ?BP: (!) 160/88  ?Pulse: 84  ?Resp: 17  ?Temp: 98.3 ?F (36.8 ?C)  ?TempSrc: Temporal  ?SpO2: 98%  ?Weight: 214 lb 3.2 oz (97.2 kg)  ?Height: 5' 9" (1.753 m)  ? ? ? ?Physical Exam ?Vitals reviewed.  ?Constitutional:   ?   Appearance: He is well-developed.  ?HENT:  ?   Head: Normocephalic and atraumatic.  ?Neck:  ?   Vascular: No carotid bruit or JVD.  ?Cardiovascular:  ?   Rate and Rhythm: Normal rate and regular rhythm.  ?   Heart sounds: Normal heart sounds. No murmur heard. ?Pulmonary:  ?   Effort: Pulmonary effort is normal.  ?   Breath sounds: Normal breath sounds. No rales.  ?Musculoskeletal:  ?   Right lower leg: No edema.  ?   Left lower leg: No edema.  ?  Skin: ?   General: Skin is warm and dry.  ?Neurological:  ?   Mental Status: He is alert and oriented to person, place, and time.  ?Psychiatric:     ?   Mood and Affect: Mood normal.  ? ? ? ? ? ?Assessment & Plan:  ?Karder Goodin is a 56 y.o. male . ?Type 2 diabetes mellitus with hyperglycemia, with long-term current use of insulin (HCC) - Plan: POCT glycosylated hemoglobin (Hb A1C) ? -Overall stable.  Some difficulty with second dose of metformin, changed to long-acting formulation for daily dosing.  RTC precautions if lows or new side effects. ? ?Essential hypertension ? -Uncontrolled in office but has not yet taken meds.  Intermittent missed doses likely also contributing.  Recommended setting alarm to help with remembering medications, home monitoring discussed with RTC precautions if blood pressures remain elevated at home on routine dosing  ? ?Hypertriglyceridemia - Plan: Lipid panel, Comprehensive metabolic panel ? -Mild previously, updated labs and medication adjustment accordingly ? ?Screen for colon cancer - Plan: Ambulatory referral to Gastroenterology ? ?Briefly discussed intermittent abdominal discomfort, reassuring exam at present, plans for return visit if symptoms return. ? ?Meds ordered this encounter  ?Medications  ? metFORMIN  (GLUCOPHAGE-XR) 500 MG 24 hr tablet  ?  Sig: Take 2 tablets (1,000 mg total) by mouth daily with breakfast.  ?  Dispense:  180 tablet  ?  Refill:  1  ? ?Patient Instructions  ? ?Try to walk, swim, bike or other l

## 2021-08-03 NOTE — Patient Instructions (Addendum)
?  Try to walk, swim, bike or other low intensity exercise most days per week - goal of 150 minutes per week. Setting an alarm may help with remembering your meds if that will be helpful. We will change metformin to once daily - both pills at once  ? ?If blood pressure remains over 140/80 with taking medications consistently, let me know.  ? ?If any return of abdominal discomfort, return to discuss further.  ? ?Return to the clinic or go to the nearest emergency room if any of your symptoms worsen or new symptoms occur. ? ? ?If you have lab work done today you will be contacted with your lab results within the next 2 weeks.  If you have not heard from [place] then please contact us. The fastest way to get your results is to register for My Chart. ? ? ?IF you received an x-ray today, you will receive an invoice from [facility]. Please contact [facility] at [phone] with questions or concerns regarding your invoice.  ? ?IF you received labwork today, you will receive an invoice from [facility]. Please contact [facility] at [phone] with questions or concerns regarding your invoice.  ? ?Our billing staff will not be able to assist you with questions regarding bills from these companies. ? ?You will be contacted with the lab results as soon as they are available. The fastest way to get your results is to activate your My Chart account. Instructions are located on the last page of this paperwork. If you have not heard from [place] regarding the results in 2 weeks, please contact this office. ?  ? ? ?

## 2021-08-04 ENCOUNTER — Encounter: Payer: Self-pay | Admitting: Family Medicine

## 2021-08-09 DIAGNOSIS — N5201 Erectile dysfunction due to arterial insufficiency: Secondary | ICD-10-CM | POA: Diagnosis not present

## 2021-08-23 ENCOUNTER — Telehealth: Payer: Self-pay | Admitting: Family Medicine

## 2021-08-23 NOTE — Telephone Encounter (Signed)
Discussed last visit.  BP uncontrolled at that time but he had not yet taken his meds.  If still having elevated blood pressure with consistent use of medication, he does need office visit.  Have him bring meds to office visit to verify doses and how he is taking them, and we can decide on changes.

## 2021-08-23 NOTE — Telephone Encounter (Signed)
PT is stating that his medication (Amlodipine 10 mg, Spironolactone 25 mg, and Chlorthalidone 25 mg) are not working for him. Patient wants to try something else.

## 2021-08-23 NOTE — Telephone Encounter (Signed)
Pt requesting alternatives, given multiple medication should I have pt schedule a visit to discuss these?

## 2021-08-24 NOTE — Telephone Encounter (Signed)
Attempted to call patient to schedule an appointment in reference to what doctor green said.

## 2021-08-31 NOTE — Telephone Encounter (Signed)
Pt has an appt with Carlota Raspberry on 09/07/21

## 2021-09-07 ENCOUNTER — Ambulatory Visit (INDEPENDENT_AMBULATORY_CARE_PROVIDER_SITE_OTHER): Payer: BC Managed Care – PPO | Admitting: Family Medicine

## 2021-09-07 ENCOUNTER — Encounter: Payer: Self-pay | Admitting: Family Medicine

## 2021-09-07 VITALS — BP 132/84 | HR 70 | Temp 98.0°F | Resp 16 | Ht 69.0 in | Wt 216.4 lb

## 2021-09-07 DIAGNOSIS — I10 Essential (primary) hypertension: Secondary | ICD-10-CM

## 2021-09-07 DIAGNOSIS — N529 Male erectile dysfunction, unspecified: Secondary | ICD-10-CM | POA: Diagnosis not present

## 2021-09-07 NOTE — Patient Instructions (Addendum)
Ok to continue just the hydralazine and valsartan for blood pressure for now. Although meds can cause some erectile dysfunction, circulation issues may be a big contributor as well, so we need to make sure we are controlling your blood pressure. You may need to try the injections as recommended by urology for erectile dysfunction, as pills are not helping. Discuss any other options.  You are due for follow up with Dr. Oval Linsey. I will send a referral for follow up and to discuss the med concerns further. If blood pressure over 140/90 at home, will need to restart some of your other meds. Call me if that happens.  Take care and let me know if there are questions.

## 2021-09-07 NOTE — Progress Notes (Signed)
Subjective:  Patient ID: Kevin Jarvis, male    DOB: 27-Mar-1966  Age: 56 y.o. MRN: [id number]  CC:  Chief Complaint  Patient presents with   Medication Refill    Discuss BP medication  Wants to discuss the side effects of his medications  He states he is having some erectile dysfunction     HPI Kevin Jarvis presents for   Diabetes: Discussed in May, difficulty with second dose of metformin, changed to long-acting formulation.  Well-controlled with A1c of 6.4 at that time. Has cut back on sugar, tolerating daily dosing. Better.   Lab Results  Component Value Date   HGBA1C 6.4 (A) 08/03/2021   HGBA1C 6.1 (A) 01/12/2021   HGBA1C 16.3 (H) 10/17/2020   Lab Results  Component Value Date   MICROALBUR 1.1 10/12/2020   [place] 72 08/03/2021   CREATININE 1.07 08/03/2021   Hypertension: Complicated by hypertensive retinopathy of both eyes, prior retinal edema as well as nonproliferative diabetic retinopathy of eyes.   BP uncontrolled in office last visit but had not yet taken his meds that day.  Intermittent missed doses -  discussed setting alarm to help with remembering medications.  He does have some concerns with potential impact on erectile dysfunction. Has been evaluated by urology, Last visit with urology on March 29.  Previous testosterone PSA were within normal limits.  Trial of tadalafil was ineffective, Trimix was prescribed.  Did not use Trimix d.t concerns with using injections. due to concerns with using injection. 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  He researched meds,  and concerned with side effects he read on spirinolactone, losartan, chlorthalidone - he decided to stop all 3 of these about 2 weeks ago. Only taking hydralazine 29m BID, valsartan 3284m- ran out 2 days ago. Home readings: 160/72  few days ago.  Has seen cardiology - Dr. RaOval Linseyn 07/2020, renal U/S with 1-50% stenosis bilateral renal arteries in June 2022.   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   Eales disease of right eye 02/05/2021   Eales disease of both eyes 01/22/2021   Hypertensive retinopathy of both eyes 11/27/2020   Retinal edema of both eyes 11/27/2020   Mild nonproliferative diabetic retinopathy of both eyes ([facility]/29/2022   Type 2 diabetes mellitus with hyperglycemia ([facility]07/28/2022   Iron deficiency anemia 06/07/2017   Anxiety 12/12/2011   Long term use of drug 12/12/2011   Hypertension 12/12/2011   Sleep disorder, 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 Allergies  Allergen Reactions   Chlorthalidone Other (See Comments)    lightheaded   Prior to Admission medications   Medication Sig Start Date End Date Taking? Authorizing Provider  ACCU-CHEK GUIDE test strip USE 1 STRIP TO CHECK GLUCOSE TWICE DAILY FASTING OR 2 HOURS AFTER MEALS 10/10/20  Yes [provider]  Accu-Chek Softclix Lancets lancets 2 (two) times daily. 10/10/20  Yes [provider]  amLODipine (NORVASC) 10 MG tablet Take 1 tablet (10 mg total) by mouth daily. 08/03/21  Yes GrWendie AgresteMD  blood glucose meter kit and supplies  Dispense based on patient and insurance preference. Use 2 times daily - fasting or 2 hours after meals. 10/06/20  Yes Wendie Agreste, MD  chlorthalidone (HYGROTON) 25 MG tablet Take 1 tablet (25 mg total) by mouth daily. 08/03/21 07/29/22 Yes Wendie Agreste, MD  hydrALAZINE (APRESOLINE) 25 MG tablet Take 1 tablet (25 mg total) by mouth 3 (three) times daily. 08/03/21 07/29/22 Yes Wendie Agreste, MD  metFORMIN (GLUCOPHAGE-XR) 500 MG 24 hr tablet Take 2 tablets (1,000 mg total) by mouth  daily with breakfast. 08/03/21  Yes Wendie Agreste, MD  spironolactone (ALDACTONE) 25 MG tablet Take 1 tablet (25 mg total) by mouth daily. 08/03/21 07/29/22 Yes Wendie Agreste, MD  valsartan (DIOVAN) 320 MG tablet Take 1 tablet (320 mg total) by mouth daily. 08/03/21  Yes Wendie Agreste, MD  Insulin Pen Needle (PEN NEEDLES) 32G X 4 MM MISC Use with solostar pen daily. Patient not taking: Reported on 09/07/2021 10/06/20   Wendie Agreste, MD  sildenafil (VIAGRA) 50 MG tablet Take 0.5-1 tablets (25-50 mg total) by mouth daily as needed for erectile dysfunction. Patient not taking: Reported on 09/07/2021 04/13/21   Wendie Agreste, MD   Social History   Socioeconomic History   Marital status: Married    Spouse name: Not on file   Number of children: 2   Years of education: Not on file   Highest education level: Not on file  Occupational History   Occupation: Glass blower/designer  Tobacco Use   Smoking status: Never   Smokeless tobacco: Never  Substance and Sexual Activity   Alcohol use: Yes   Drug use: No   Sexual activity: Not on file  Other Topics Concern   Not on file  Social History Narrative   From [place] - Moved to [place] 2000   Lives with wife and son   Social Determinants of Health   Financial Resource Strain: Not on file  Food Insecurity: Not on file  Transportation Needs: Not on file  Physical Activity: Not on file  Stress: Not on file  Social Connections: Not on file  Intimate Partner Violence: Not on file    Review of Systems  Constitutional:  Negative for fatigue and unexpected weight change.  Eyes:  Negative for visual disturbance.  Respiratory:  Negative for cough, chest tightness and shortness of breath.   Cardiovascular:  Negative for chest pain, palpitations and leg swelling.  Gastrointestinal:  Negative for abdominal pain and blood in stool.  Neurological:  Negative for dizziness, light-headedness and headaches.     Objective:   Vitals:   09/07/21 1032   BP: 132/84  Pulse: 70  Resp: 16  Temp: 98 F (36.7 C)  TempSrc: Temporal  SpO2: 98%  Weight: 216 lb 6 oz (98.1 kg)  Height: _0  (1.753 m)     Physical Exam Vitals reviewed.  Constitutional:      Appearance: He is well-developed.  HENT:     Head: Normocephalic and atraumatic.  Neck:     Vascular: No carotid bruit or JVD.  Cardiovascular:     Rate and Rhythm: Normal rate and regular rhythm.     Heart sounds: Normal heart sounds. No murmur heard. Pulmonary:     Effort: Pulmonary effort is normal.     Breath sounds: Normal breath sounds. No rales.  Musculoskeletal:     Right lower leg: No edema.     Left lower leg: No edema.  Skin:    General: Skin  is warm and dry.  Neurological:     Mental Status: He is alert and oriented to person, place, and time.  Psychiatric:        Mood and Affect: Mood normal.        Assessment & Plan:  Kevin Jarvis is a 56 y.o. male . Essential hypertension - Plan: Ambulatory referral [facility]  Erectile dysfunction, unspecified erectile dysfunction type - Plan: Ambulatory referral to [facility]  Difficult situation.  Some difficulty with control of blood pressure previously, suspect circulation component to erectile dysfunction, not effectively treated with oral medications and he does not want to perform injections.  Understand his concern regarding potential side effects of antihypertensives and erectile dysfunction but also discussed the circulation component and concerns given other issues with hypertension including retinopathy.  Blood pressure overall looks okay on the 2 medications today, will continue hydralazine and valsartan for now with close follow-up at hypertension clinic, cardiology.  Home monitoring and if elevations in BP will need to add back medication.  Understanding of plan expressed.  In regards to the erectile dysfunction, has met with urology and  they had discussed options, recommend he discuss any further options or concerns with urology if needed. No orders of the defined types were placed in this encounter.  Patient Instructions  Ok to continue just the hydralazine and valsartan for blood pressure for now. Although meds can cause some erectile dysfunction, circulation issues may be a big contributor as well, so we need to make sure we are controlling your blood pressure. You may need to try the injections as recommended by urology for erectile dysfunction, as pills are not helping. Discuss any other options.  You are due for follow up with Dr. Oval Linsey. I will send a referral for follow up and to discuss the med concerns further. If blood pressure over 140/90 at home, will need to restart some of your other meds. Call me if that happens.  Take care and let me know if there are questions.      Signed,   Merri Ray, MD [place] 09/07/21 1:57 PM

## 2021-09-14 ENCOUNTER — Ambulatory Visit: Payer: BC Managed Care – PPO | Admitting: Family Medicine

## 2021-09-20 ENCOUNTER — Emergency Department (HOSPITAL_BASED_OUTPATIENT_CLINIC_OR_DEPARTMENT_OTHER): Payer: BC Managed Care – PPO

## 2021-09-20 ENCOUNTER — Emergency Department (HOSPITAL_BASED_OUTPATIENT_CLINIC_OR_DEPARTMENT_OTHER)
Admission: EM | Admit: 2021-09-20 | Discharge: 2021-09-20 | Disposition: A | Discharge status: Home / Self Care | Payer: BC Managed Care – PPO | Attending: Emergency Medicine | Admitting: Emergency Medicine

## 2021-09-20 ENCOUNTER — Encounter (HOSPITAL_BASED_OUTPATIENT_CLINIC_OR_DEPARTMENT_OTHER): Payer: Self-pay | Admitting: Obstetrics and Gynecology

## 2021-09-20 ENCOUNTER — Encounter (HOSPITAL_BASED_OUTPATIENT_CLINIC_OR_DEPARTMENT_OTHER): Payer: Self-pay | Admitting: Family

## 2021-09-20 ENCOUNTER — Other Ambulatory Visit: Payer: Self-pay

## 2021-09-20 ENCOUNTER — Ambulatory Visit (HOSPITAL_BASED_OUTPATIENT_CLINIC_OR_DEPARTMENT_OTHER): Payer: BC Managed Care – PPO | Admitting: Family

## 2021-09-20 VITALS — BP 190/98 | HR 61 | Ht 69.0 in | Wt 215.0 lb

## 2021-09-20 DIAGNOSIS — R519 Headache, unspecified: Secondary | ICD-10-CM

## 2021-09-20 DIAGNOSIS — Z79899 Other long term (current) drug therapy: Secondary | ICD-10-CM | POA: Insufficient documentation

## 2021-09-20 DIAGNOSIS — I10 Essential (primary) hypertension: Secondary | ICD-10-CM

## 2021-09-20 LAB — CBC WITH DIFFERENTIAL/PLATELET
Abs Immature Granulocytes: 0.01 10*3/uL (ref 0.00–0.07)
Basophils Absolute: 0 10*3/uL (ref 0.0–0.1)
Basophils Relative: 0 %
Eosinophils Absolute: 0 10*3/uL (ref 0.0–0.5)
Eosinophils Relative: 1 %
HCT: 41.1 % (ref 39.0–52.0)
Hemoglobin: 13.5 g/dL (ref 13.0–17.0)
Immature Granulocytes: 0 %
Lymphocytes Relative: 33 %
Lymphs Abs: 1.5 10*3/uL (ref 0.7–4.0)
MCH: 29 pg (ref 26.0–34.0)
MCHC: 32.8 g/dL (ref 30.0–36.0)
MCV: 88.2 fL (ref 80.0–100.0)
Monocytes Absolute: 0.4 10*3/uL (ref 0.1–1.0)
Monocytes Relative: 9 %
Neutro Abs: 2.6 10*3/uL (ref 1.7–7.7)
Neutrophils Relative %: 57 %
Platelets: 230 10*3/uL (ref 150–400)
RBC: 4.66 MIL/uL (ref 4.22–5.81)
RDW: 12.9 % (ref 11.5–15.5)
WBC: 4.5 10*3/uL (ref 4.0–10.5)
nRBC: 0 % (ref 0.0–0.2)

## 2021-09-20 LAB — COMPREHENSIVE METABOLIC PANEL
ALT: 16 U/L (ref 0–44)
AST: 16 U/L (ref 15–41)
Albumin: 4.4 g/dL (ref 3.5–5.0)
Alkaline Phosphatase: 43 U/L (ref 38–126)
Anion gap: 11 (ref 5–15)
BUN: 15 mg/dL (ref 6–20)
CO2: 27 mmol/L (ref 22–32)
Calcium: 10.2 mg/dL (ref 8.9–10.3)
Chloride: 101 mmol/L (ref 98–111)
Creatinine, Ser: 1.16 mg/dL (ref 0.61–1.24)
GFR, Estimated: >60 mL/min (ref >60)
Glucose, Bld: 134 mg/dL — ABNORMAL HIGH (ref 70–99)
Potassium: 3.4 mmol/L — ABNORMAL LOW (ref 3.5–5.1)
Sodium: 139 mmol/L (ref 135–145)
Total Bilirubin: 0.5 mg/dL (ref 0.3–1.2)
Total Protein: 7.6 g/dL (ref 6.5–8.1)

## 2021-09-20 MED ORDER — PROCHLORPERAZINE MALEATE 10 MG PO TABS: 10.0000 mg | ORAL_TABLET | Freq: Two times a day (BID) | ORAL | 0 refills | Status: DC | PRN

## 2021-09-20 MED ORDER — ACETAMINOPHEN 500 MG PO TABS
1000.0000 mg | ORAL_TABLET | Freq: Once | ORAL | Status: AC
Start: 2021-09-20 — End: 2021-09-20
  Administered 2021-09-20: 1000 mg via ORAL
  Filled 2021-09-20: qty 2

## 2021-09-20 MED ORDER — HYDRALAZINE HCL 20 MG/ML IJ SOLN
10.0000 mg | Freq: Once | INTRAMUSCULAR | Status: AC
  Administered 2021-09-20: 10 mg via INTRAVENOUS
  Filled 2021-09-20: qty 1

## 2021-09-20 MED ORDER — HYDRALAZINE HCL 50 MG PO TABS: 50.0000 mg | ORAL_TABLET | Freq: Three times a day (TID) | ORAL | 3 refills | Status: DC

## 2021-09-20 NOTE — Progress Notes (Signed)
Advanced Hypertension Clinic Assessment:    Date:  09/20/2021   ID:  Kevin Jarvis, DOB Feb 15, 1966, MRN [id number]  PCP:  Wendie Agreste, MD  Cardiologist:  None  Nephrologist:  Referring MD: Wendie Agreste, MD   CC: Hypertension  History of Present Illness:    Kevin Jarvis is a 56 y.o. male with a hx of hypertension here for over due follow up in the Advanced Hypertension Clinic.   Last seen 07/2020 as new patient in hypertension clinic.  HCTZ was stopped due to patient concern for side effects. He was started on Chlorthalidone '25mg'$  daily. Renal duplex ordered. Amlodipine '10mg'$ , Losartan '100mg'$ , Spironolactone '25mg'$  were continued. Renal duplex 08/2020 bilateral 1-59% stenosis.   Presents today for overdue follow up. Chief complaint of headache for the last 5-6 days that is 10/10 on the pain scale. Took '650mg'$  of Tylenol without relief. He cannot describe character, says it "just hurts". Starts on top of his head and radiates down his face.   He is very concerned about side effect of erectile dysfunction with his medications. Discontinued chlorthalidone, spironolactone. At clinic visit with PCP 09/07/21 he had self discontinued these medications though BP was 132/84 at that time.   He is currently taking Valsartan and Hydralazine. Takes hydralazine in the morning, at lunch, and then at bedtime. Did not yet take this morning. He takes Valsartan during lunch at work which is around [employer]. 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   He monitors blood pressure at home and it has been labile. 193X--902I systolic on arm and wrist cuff. Has not had checked for accuracy.   No chest pain, dyspnea, edema, orthopnea, PND, lightheadedness, dizziness.   Previous antihypertensives: HCTZ (stopped due to wife's concern for potential side effects)  Spironolactone (patient preference - concerned for ED) Chlorthalidone (patient preference - concerned for ED)  Past Medical History:   Diagnosis Date   Anxiety    Arthritis    Hypertension     No past surgical history on file.  Current Medications: Current Meds  Medication Sig   hydrALAZINE (APRESOLINE) 25 MG tablet Take 1 tablet (25 mg total) by mouth 3 (three) times daily.   valsartan (DIOVAN) 320 MG tablet Take 1 tablet (320 mg total) by mouth daily.     Allergies:   Chlorthalidone   Social History   Socioeconomic History   Marital status: Married    Spouse name: Not on file   Number of children: 2   Years of education: Not on file   Highest education level: Not on file  Occupational History   Occupation: Glass blower/designer  Tobacco Use   Smoking status: Never   Smokeless tobacco: Never  Substance and Sexual Activity   Alcohol use: Yes   Drug use: No   Sexual activity: Not on file  Other Topics Concern   Not on file  Social History Narrative   From [place] - Moved to [place] 2000   Lives with wife and son   Social Determinants of Health   Financial Resource Strain: Not on file  Food Insecurity: Not on file  Transportation Needs: Not on file  Physical Activity: Not on file  Stress: Not on file  Social Connections: Not on file     Family History: The patient's family history is not on file.  ROS:   Please see the history of present illness.     All other systems reviewed and are negative.  EKGs/Labs/Other  Studies Reviewed:    EKG:  EKG is ordered today.  The ekg ordered today demonstrates NSR 61 bpm with LVH and repolarization abnormality. Reviewed with Dr. Harrell Gave in clinic.   Recent Labs: 08/03/2021: ALT 27; BUN 19; Creatinine, Ser 1.07; Potassium 3.5; Sodium 138   Recent Lipid Panel    Component Value Date/Time   CHOL 121 08/03/2021 1155   CHOL 128 09/24/2019 1009   TRIG 51.0 08/03/2021 1155   HDL 39.20 08/03/2021 1155   HDL 45 09/24/2019 1009   CHOLHDL 3 08/03/2021 1155   VLDL 10.2 08/03/2021 1155   LDLCALC 72 08/03/2021 1155   LDLCALC 71 09/24/2019 1009    Physical  Exam:   VS:  BP (!) 190/98 (BP Location: Right Arm, Patient Position: Sitting, Cuff Size: Normal)   Pulse 61   Ht '5\' 9"'$  (1.753 m)   Wt 215 lb (97.5 kg)   BMI 31.75 kg/m  , BMI Body mass index is 31.75 kg/m. GENERAL:  Well appearing, overweight HEENT: Pupils equal round and reactive, fundi not visualized, oral mucosa unremarkable NECK:  No jugular venous distention, waveform within normal limits, carotid upstroke brisk and symmetric, no bruits, no thyromegaly LYMPHATICS:  No cervical adenopathy LUNGS:  Clear to auscultation bilaterally HEART:  RRR.  PMI not displaced or sustained,S1 and S2 within normal limits, no S3, no S4, no clicks, no rubs, murmurs ABD:  Flat, positive bowel sounds normal in frequency in pitch, no bruits, no rebound, no guarding, no midline pulsatile mass, no hepatomegaly, no splenomegaly EXT:  2 plus pulses throughout, no edema, no cyanosis no clubbing SKIN:  No rashes no nodules NEURO:  Cranial nerves II through XII grossly intact, motor grossly intact throughout PSYCH:  Cognitively intact, oriented to person place and time   ASSESSMENT/PLAN:    Headache - 10/10 headache for 5-6 days not relieved by Tylenol. Recommend he be evaluated in the ED for consideration of CT, migraine cocktail. BP at home 160s-180s which has previously not caused significant hypertension. Concern for alternate etiology.   HTN - BP not at goal <130/80. Patient concerned for side effects with Spironolactone, Chlorthalidone, HCTZ which he has discontinued. Continue valsartan '320mg'$  daily. Increase Hydralazine to '50mg'$  TID.   ED - F/u with urology.   Obesity - Weight loss via diet and exercise encouraged. Discussed the impact being overweight would have on cardiovascular risk.   Screening for Secondary Hypertension:     08/22/2020   10:08 AM  Causes  Drugs/Herbals Screened     - Comments Discussed avoiding NSAIDs.  Use Tylenol only.  Renovascular HTN Screened     - Comments Check renal  artery Dopplers  Sleep Apnea Screened     - Comments No symptoms  Thyroid Disease Screened  Hyperaldosteronism Not Screened     - Comments Check renin and aldosterone at a later time.  Blood pressure remains elevated on spironolactone making this less likely.  Pheochromocytoma N/A  Cushing's Syndrome N/A  Hyperparathyroidism N/A  Coarctation of the Aorta Screened     - Comments Blood pressures are different in each arm by 8 mmHg.  Continue to monitor.  Compliance Screened     - Comments Reports that he is taking all his medicine except his hydrochlorothiazide.  He does forget to take them at times.  He feels worse when he does not take his medicine.    Relevant Labs/Studies:    Latest Ref Rng & Units 08/03/2021   11:55 AM 01/12/2021   11:34 AM 10/17/2020  10:02 AM  Basic Labs  Sodium 135 - 145 mEq/L 138  138  132   Potassium 3.5 - 5.1 mEq/L 3.5  3.9  3.9   Creatinine 0.40 - 1.50 mg/dL 1.07  1.21  1.19        Latest Ref Rng & Units 02/02/2020    9:23 AM 10/24/2016   10:34 AM  Thyroid   TSH 0.450 - 4.500 uIU/mL 1.370  2.210                 09/05/2020    8:45 AM  Renovascular   Renal Artery [place] Completed Yes      Disposition:    FU with Dr. Oval Linsey or Loel Dubonnet, NP  in 2 weeks    Medication Adjustments/Labs and Tests Ordered: Current medicines are reviewed at length with the patient today.  Concerns regarding medicines are outlined above.  No orders of the defined types were placed in this encounter.  No orders of the defined types were placed in this encounter.    Signed, Loel Dubonnet, NP  09/20/2021 9:10 AM    [place]

## 2021-09-20 NOTE — ED Notes (Signed)
Discharge instructions, follow up care, and prescription\ reviewed and explained, pt verbalized understanding. Pt caox4 and ambulatory on departure.

## 2021-09-20 NOTE — ED Triage Notes (Signed)
Patient presents to the ER for headache x5 days. Patient reports he has had no N/V/D. Patient denies photosensitivity or sound sensitivity. Patient reports some visual changes including blurred vision that he reports has been happening for "sometime". Patient reports taking tylenol without relief.

## 2021-09-25 ENCOUNTER — Telehealth: Payer: Self-pay

## 2021-09-25 NOTE — Telephone Encounter (Signed)
ER note reviewed, hydralazine was increased to 50 mg 3 times daily at his cardiology appointment June 22.  Was continued on valsartan 320 mg daily.  He was off chlorthalidone, spironolactone, HCTZ at that time. He was prescribed 270 hydralazine on June 22 so should still have some that medication.  90 with 3 refills of valsartan on May 5.  What medications does he need refilled?  Did he restart other blood pressure meds?  Please clarify specifics of request and I am happy to send some medication temporarily if we need to until he is able to be seen in the office in the next few weeks.

## 2021-09-25 NOTE — Telephone Encounter (Signed)
Pt went to ED for bp and is asking that you send in additional medication, you do not have anything available for over 2 weeks and same with Janeece Agee   Please advise

## 2021-09-26 NOTE — Telephone Encounter (Signed)
Called pt no answer and VM not set up  

## 2021-09-27 NOTE — Telephone Encounter (Signed)
Called again no answer  

## 2021-09-28 NOTE — Telephone Encounter (Signed)
Pt has not answered should I send letter?

## 2021-09-28 NOTE — Telephone Encounter (Signed)
Yes - unable to contact letter. Thanks.

## 2021-10-09 ENCOUNTER — Encounter: Payer: Self-pay | Admitting: Gastroenterology

## 2021-10-10 ENCOUNTER — Encounter: Payer: Self-pay | Admitting: *Deleted

## 2021-10-11 ENCOUNTER — Ambulatory Visit (HOSPITAL_BASED_OUTPATIENT_CLINIC_OR_DEPARTMENT_OTHER): Payer: BC Managed Care – PPO | Admitting: Family

## 2021-10-11 ENCOUNTER — Telehealth: Payer: Self-pay | Admitting: Family

## 2021-10-11 ENCOUNTER — Encounter (HOSPITAL_BASED_OUTPATIENT_CLINIC_OR_DEPARTMENT_OTHER): Payer: Self-pay | Admitting: Family

## 2021-10-11 VITALS — BP 194/84 | HR 70 | Ht 69.0 in | Wt 218.2 lb

## 2021-10-11 DIAGNOSIS — N529 Male erectile dysfunction, unspecified: Secondary | ICD-10-CM | POA: Diagnosis not present

## 2021-10-11 DIAGNOSIS — I10 Essential (primary) hypertension: Secondary | ICD-10-CM | POA: Diagnosis not present

## 2021-10-11 DIAGNOSIS — E669 Obesity, unspecified: Secondary | ICD-10-CM | POA: Diagnosis not present

## 2021-10-11 MED ORDER — AMLODIPINE-VALSARTAN-HCTZ 10-320-25 MG PO TABS: 1.0000 | ORAL_TABLET | Freq: Every day | ORAL | 5 refills | Status: DC

## 2021-10-11 MED ORDER — OLMESARTAN-AMLODIPINE-HCTZ 40-10-25 MG PO TABS: 1.0000 | ORAL_TABLET | Freq: Every day | ORAL | 3 refills | Status: DC

## 2021-10-11 NOTE — Telephone Encounter (Signed)
Pt c/o medication issue:  1. Name of Medication: amLODIPine-Valsartan-HCTZ 10-320-25 MG TABS  2. How are you currently taking this medication (dosage and times per day)? Has not started  3. Are you having a reaction (difficulty breathing--STAT)? no  4. What is your medication issue? [facility] states the medication was recalled and pulled from the market. They would like to know if the medication can be split into 2-3 different drugs, because they are not sure when they will have the medication again.  Phone: [phone]

## 2021-10-11 NOTE — Telephone Encounter (Signed)
Can we instead Rx Olmesartan-Amlodipine-HCTZ 40-10-'25mg'$  one tab QD? Trying to do combo tablet for him.   Loel Dubonnet, NP

## 2021-10-11 NOTE — Patient Instructions (Signed)
Medication Instructions:  Your physician has recommended you make the following change in your medication:   STOP Valsartan  START Amlodipine-Valsartan-HCTZ 10-325-'25mg'$  daily  CONTINUE Hydralazine '50mg'$  three times per day.    *If you need a refill on your cardiac medications before your next appointment, please call your pharmacy*   Lab Work: Your physician recommends that you return for lab work in 1 week: BMP, TSH  If you have labs (blood work) drawn today and your tests are completely normal, you will receive your results only by: [place] (if you have [place]) OR A paper copy in the mail If you have any lab test that is abnormal or we need to change your treatment, we will call you to review the results.   Testing/Procedures: None ordered today.   Follow-Up: At [facility], you and your health needs are our priority.  As part of our continuing mission to provide you with exceptional heart care, we have created designated Provider Care Teams.  These Care Teams include your primary Cardiologist (physician) and Advanced Practice Providers (APPs -  Physician Assistants and Nurse Practitioners) who all work together to provide you with the care you need, when you need it.  We recommend signing up for the patient portal called "MyChart".  Sign up information is provided on this After Visit Summary.  MyChart is used to connect with patients for Virtual Visits (Telemedicine).  Patients are able to view lab/test results, encounter notes, upcoming appointments, etc.  Non-urgent messages can be sent to your provider as well.   To learn more about what you can do with MyChart, go to [email or web address].    Your next appointment:   4-6 week(s)  The format for your next appointment:   In Person  Provider:   Skeet Latch, MD or Loel Dubonnet, NP  or Tommy Medal, [facility]    Other Instructions  Tips to Measure your Blood Pressure Correctly  To determine whether you  have hypertension, a medical professional will take a blood pressure reading. How you prepare for the test, the position of your arm, and other factors can change a blood pressure reading by 10% or more. That could be enough to hide high blood pressure, start you on a drug you don't really need, or lead your doctor to incorrectly adjust your medications.  National and international guidelines offer specific instructions for measuring blood pressure. If a doctor, nurse, or medical assistant isn't doing it right, don't hesitate to ask him or her to get with the guidelines.  Here's what you can do to ensure a correct reading:  Don't drink a caffeinated beverage or smoke during the 30 minutes before the test.  Sit quietly for five minutes before the test begins.  During the measurement, sit in a chair with your feet on the floor and your arm supported so your elbow is at about heart level.  The inflatable part of the cuff should completely cover at least 80% of your upper arm, and the cuff should be placed on bare skin, not over a shirt.  Don't talk during the measurement.  Blood pressure categories  Blood pressure category SYSTOLIC (upper number)  DIASTOLIC (lower number)  Normal Less than 120 mm Hg and Less than 80 mm Hg  Elevated 120-129 mm Hg and Less than 80 mm Hg  High blood pressure: Stage 1 hypertension 130-139 mm Hg or 80-89 mm Hg  High blood pressure: Stage 2 hypertension 140 mm Hg or higher or 90 mm  Hg or higher  Hypertensive crisis (consult your doctor immediately) Higher than 180 mm Hg and/or Higher than 120 mm Hg  Source: American Heart Association and American Stroke Association. For more on getting your blood pressure under control, buy Controlling Your Blood Pressure, a Special Health Report from [facility].   Blood Pressure Log   Date   Time  Blood Pressure  Example: Nov 1 9 AM 124/78                                               Important  Information About Sugar

## 2021-10-11 NOTE — Telephone Encounter (Signed)
New prescription sent to pharmacy, RN updated patient     "Can we instead Rx Olmesartan-Amlodipine-HCTZ 40-10-'25mg'$  one tab QD? Trying to do combo tablet for him.    Loel Dubonnet, NP "

## 2021-10-11 NOTE — Progress Notes (Signed)
Advanced Hypertension Clinic Assessment:    Date:  10/11/2021   ID:  Kevin Jarvis, DOB Dec 05, 1965, MRN [id number]  PCP:  Wendie Agreste, MD  Cardiologist:  None  Nephrologist:  Referring MD: Wendie Agreste, MD   CC: Hypertension  History of Present Illness:    Kevin Jarvis is a 56 y.o. male with a hx of hypertension here for follow up in the Advanced Hypertension Clinic.   Evaluated 07/2020 as new patient in hypertension clinic.  HCTZ was stopped due to patient concern for side effects. He was started on Chlorthalidone '25mg'$  daily. Renal duplex ordered. Amlodipine '10mg'$ , Losartan '100mg'$ , Spironolactone '25mg'$  were continued. Renal duplex 08/2020 bilateral 1-59% stenosis.   He was seen 09/20/2021 for overdue follow-up.  He noted headache for 5 to 6 days that was 10 out of 10 on pain scale which did not relieve with Tylenol.  He was very concerned about side effects of erectile dysfunction with his medications. Discontinued chlorthalidone, spironolactone on his own.  Hydralazine was increased to 50 mg 3 times daily.  He was also encouraged to go to the emergency department for evaluation.  ED work-up including CT head unremarkable.  Presents today for follow-up.  Notes headache has resolved.  BP at home 170s over 100s on wrist cuff which has not been checked for accuracy.[place] Kitchen  He is motivated to improve his blood pressure and recognizes the role that not taking medications as prescribed his blood and elevated blood pressure. 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 No chest pain, dyspnea, edema, orthopnea, PND, lightheadedness, dizziness.   Previous antihypertensives: HCTZ (stopped due to wife's concern for potential side effects - never experienced side effects)  Spironolactone (patient preference - concerned for ED) Chlorthalidone (patient preference - concerned for ED)  Past Medical History:  Diagnosis Date   Anxiety    Arthritis    Hypertension     History  reviewed. No pertinent surgical history.  Current Medications: Current Meds  Medication Sig   amLODIPine-Valsartan-HCTZ 10-320-25 MG TABS Take 1 tablet by mouth daily.   hydrALAZINE (APRESOLINE) 50 MG tablet Take 1 tablet (50 mg total) by mouth 3 (three) times daily.   metFORMIN (GLUCOPHAGE-XR) 500 MG 24 hr tablet Take 2 tablets (1,000 mg total) by mouth daily with breakfast.   [DISCONTINUED] valsartan (DIOVAN) 320 MG tablet Take 1 tablet (320 mg total) by mouth daily.     Allergies:   Chlorthalidone   Social History   Socioeconomic History   Marital status: Married    Spouse name: Not on file   Number of children: 2   Years of education: Not on file   Highest education level: Not on file  Occupational History   Occupation: Glass blower/designer  Tobacco Use   Smoking status: Never   Smokeless tobacco: Never  Vaping Use   Vaping Use: Never used  Substance and Sexual Activity   Alcohol use: Yes    Comment: Social   Drug use: No   Sexual activity: Yes  Other Topics Concern   Not on file  Social History Narrative   From [place] - Moved to [place] 2000   Lives with wife and son   Social Determinants of Health   Financial Resource Strain: Not on file  Food Insecurity: Not on file  Transportation Needs: Not on file  Physical Activity: Not on file  Stress: Not on file  Social Connections: Not on file     Family History: The  patient's family history is not on file.  ROS:   Please see the history of present illness.     All other systems reviewed and are negative.  EKGs/Labs/Other Studies Reviewed:    EKG:  EKG is ordered today.  The ekg ordered today demonstrates NSR 61 bpm with LVH and repolarization abnormality. Reviewed with Dr. Harrell Gave in clinic.   Recent Labs: 09/20/2021: ALT 16; BUN 15; Creatinine, Ser 1.16; Hemoglobin 13.5; Platelets 230; Potassium 3.4; Sodium 139   Recent Lipid Panel    Component Value Date/Time   CHOL 121 08/03/2021 1155   CHOL 128  09/24/2019 1009   TRIG 51.0 08/03/2021 1155   HDL 39.20 08/03/2021 1155   HDL 45 09/24/2019 1009   CHOLHDL 3 08/03/2021 1155   VLDL 10.2 08/03/2021 1155   LDLCALC 72 08/03/2021 1155   LDLCALC 71 09/24/2019 1009    Physical Exam:   VS:  BP (!) 194/84 (BP Location: Left Arm, Patient Position: Sitting)   Pulse 70   Ht '5\' 9"'$  (1.753 m)   Wt 218 lb 3.2 oz (99 kg)   SpO2 96%   BMI 32.22 kg/m  , BMI Body mass index is 32.22 kg/m. GENERAL:  Well appearing, overweight HEENT: Pupils equal round and reactive, fundi not visualized, oral mucosa unremarkable NECK:  No jugular venous distention, waveform within normal limits, carotid upstroke brisk and symmetric, no bruits, no thyromegaly LYMPHATICS:  No cervical adenopathy LUNGS:  Clear to auscultation bilaterally HEART:  RRR.  PMI not displaced or sustained,S1 and S2 within normal limits, no S3, no S4, no clicks, no rubs, murmurs ABD:  Flat, positive bowel sounds normal in frequency in pitch, no bruits, no rebound, no guarding, no midline pulsatile mass, no hepatomegaly, no splenomegaly EXT:  2 plus pulses throughout, no edema, no cyanosis no clubbing SKIN:  No rashes no nodules NEURO:  Cranial nerves II through XII grossly intact, motor grossly intact throughout PSYCH:  Cognitively intact, oriented to person place and time   ASSESSMENT/PLAN:    HTN - BP not at goal <130/80. Patient concerned for side effects with Spironolactone, Chlorthalidone, HCTZ which he previously self discontinued.  However he never truly experienced symptoms on any of these medications.  Is now motivated to improve control of his blood pressure.  We will stop valsartan and start amlodipine-valsartan-HCTZ 10 - 320 - 25 mg daily.  Continue hydralazine 50 mg 3 times daily.  Ultimately for easier medication management may be worthwhile to transition hydralazine to twice daily at follow up if notable improvement in BP. BMP/TSH in 1 week.  ED - no current symptoms. F/u with  urology.  Would be reasonable to trial tadalafil or sildenafil if needed.  Obesity - Weight loss via diet and exercise encouraged. Discussed the impact being overweight would have on cardiovascular risk.  Discuss possible referral to PREP at follow-up.  Screening for Secondary Hypertension:     08/22/2020   10:08 AM  Causes  Drugs/Herbals Screened     - Comments Discussed avoiding NSAIDs.  Use Tylenol only.  Renovascular HTN Screened     - Comments Check renal artery Dopplers  Sleep Apnea Screened     - Comments No symptoms  Thyroid Disease Screened  Hyperaldosteronism Not Screened     - Comments Check renin and aldosterone at a later time.  Blood pressure remains elevated on spironolactone making this less likely.  Pheochromocytoma N/A  Cushing's Syndrome N/A  Hyperparathyroidism N/A  Coarctation of the Aorta Screened     -  Comments Blood pressures are different in each arm by 8 mmHg.  Continue to monitor.  Compliance Screened     - Comments Reports that he is taking all his medicine except his hydrochlorothiazide.  He does forget to take them at times.  He feels worse when he does not take his medicine.    Relevant Labs/Studies:    Latest Ref Rng & Units 09/20/2021   10:12 AM 08/03/2021   11:55 AM 01/12/2021   11:34 AM  Basic Labs  Sodium 135 - 145 mmol/L 139  138  138   Potassium 3.5 - 5.1 mmol/L 3.4  3.5  3.9   Creatinine 0.61 - 1.24 mg/dL 1.16  1.07  1.21        Latest Ref Rng & Units 02/02/2020    9:23 AM 10/24/2016   10:34 AM  Thyroid   TSH 0.450 - 4.500 uIU/mL 1.370  2.210                 09/05/2020    8:45 AM  Renovascular   Renal Artery [place] Completed Yes      Disposition:    FU with Dr. Oval Linsey or Loel Dubonnet, NP or Tommy Medal, [facility] in 4-6 weeks   Medication Adjustments/Labs and Tests Ordered: Current medicines are reviewed at length with the patient today.  Concerns regarding medicines are outlined above.  Orders Placed This Encounter   Procedures   Basic metabolic panel   TSH   Meds ordered this encounter  Medications   amLODIPine-Valsartan-HCTZ 10-320-25 MG TABS    Sig: Take 1 tablet by mouth daily.    Dispense:  30 tablet    Refill:  5    Order Specific Question:   Supervising Provider    Answer:   Buford Dresser [[id number]]     Signed, Loel Dubonnet, NP  10/11/2021 8:49 AM    [place]

## 2021-10-11 NOTE — Telephone Encounter (Signed)
Please advise 

## 2021-10-22 ENCOUNTER — Telehealth: Payer: Self-pay

## 2021-10-22 DIAGNOSIS — Z0000 Encounter for general adult medical examination without abnormal findings: Secondary | ICD-10-CM

## 2021-10-22 NOTE — Telephone Encounter (Signed)
Called patient to determine if he had returned Vivify cuff upon completion of program of if he still has access and need to return the device to Dr. Oval Linsey. Left message for patient to return call to provide status update on the cuff.    Kevin Jarvis, [facility] Guide, Health Coach [address], [address] [place] [address] Telephone: [phone] Email: [email or web address]

## 2021-11-02 ENCOUNTER — Telehealth: Payer: Self-pay

## 2021-11-02 DIAGNOSIS — Z0000 Encounter for general adult medical examination without abnormal findings: Secondary | ICD-10-CM

## 2021-11-02 NOTE — Telephone Encounter (Signed)
Returned patient's call regarding the return of the Vivify cuff. Left message informing patient that he does not need an appointment to return the device to the [facility] location and if he needs to be seen to call and make ana appointment. Requested the patient to call back to provide a status update on the cuff.    Joscelin Fray [place], [facility] Guide, Health Coach [address] [place] [address] Telephone: [phone] Email: [email or web address]

## 2021-11-07 ENCOUNTER — Ambulatory Visit (HOSPITAL_BASED_OUTPATIENT_CLINIC_OR_DEPARTMENT_OTHER): Payer: BC Managed Care – PPO | Admitting: Family

## 2021-11-07 NOTE — Progress Notes (Deleted)
Advanced Hypertension Clinic Assessment:    Date:  11/07/2021   ID:  Kevin Jarvis, DOB 01-16-1966, MRN [id number]  PCP:  Wendie Agreste, MD  Cardiologist:  None  Nephrologist:  Referring MD: Wendie Agreste, MD   CC: Hypertension  History of Present Illness:    Kevin Jarvis is a 56 y.o. male with a hx of hypertension here for follow up in the [facility]pertension [facility].   Evaluated 07/2020 as new patient in hypertension clinic.  HCTZ was stopped due to patient concern for side effects. He was started on Chlorthalidone '25mg'$  daily. Renal duplex ordered. Amlodipine '10mg'$ , Losartan '100mg'$ , Spironolactone '25mg'$  were continued. Renal duplex 08/2020 bilateral 1-59% stenosis.   He was seen 09/20/2021 for overdue follow-up.  He noted headache for 5 to 6 days that was 10 out of 10 on pain scale which did not relieve with Tylenol.  He was very concerned about side effects of erectile dysfunction with his medications. Discontinued chlorthalidone, spironolactone on his own.  Hydralazine was increased to 50 mg 3 times daily.  He was also encouraged to go to the emergency department for evaluation.  ED work-up including CT head unremarkable.  Last seen 10/11/21. Valsartan was discontinued and he was started on Amlodipine-Valsartan-HCTZ 10-320-'25mg'$  daily. However based on pharmacy availability was transitioned to Olmesartan-Amlodipine-HCTZ 40-10-25. Hydralazine continued at '50mg'$  TID.   ***  Presents today for follow-up. *** Notes headache has resolved.  BP at home 170s over 100s on wrist cuff which has not been checked for accuracy.Marland Kitchen  He is motivated to improve his blood pressure and recognizes the role that not taking medications as prescribed his blood and elevated blood pressure. 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 No chest pain, dyspnea, edema, orthopnea, PND, lightheadedness, dizziness.   Previous antihypertensives: HCTZ (stopped due to wife's concern for potential side  effects - never experienced side effects)  Spironolactone (patient preference - concerned for ED) Chlorthalidone (patient preference - concerned for ED)  Past Medical History:  Diagnosis Date   Anxiety    Arthritis    Hypertension     No past surgical history on file.  Current Medications: No outpatient medications have been marked as taking for the 11/07/21 encounter (Appointment) with Loel Dubonnet, NP.     Allergies:   Chlorthalidone   Social History   Socioeconomic History   Marital status: Married    Spouse name: Not on file   Number of children: 2   Years of education: Not on file   Highest education level: Not on file  Occupational History   Occupation: Glass blower/designer  Tobacco Use   Smoking status: Never   Smokeless tobacco: Never  Vaping Use   Vaping Use: Never used  Substance and Sexual Activity   Alcohol use: Yes    Comment: Social   Drug use: No   Sexual activity: Yes  Other Topics Concern   Not on file  Social History Narrative   From [place] - Moved to [place] 2000   Lives with wife and son   Social Determinants of Health   Financial Resource Strain: Not on file  Food Insecurity: Not on file  Transportation Needs: Not on file  Physical Activity: Not on file  Stress: Not on file  Social Connections: Not on file     Family History: The patient's family history is not on file.  ROS:   Please see the history of present illness.  All other systems reviewed and are negative.  EKGs/Labs/Other Studies Reviewed:    EKG:  EKG is ordered today.  The ekg ordered today demonstrates NSR 61 bpm with LVH and repolarization abnormality. Reviewed with Dr. Harrell Gave in clinic.   Recent Labs: 09/20/2021: ALT 16; BUN 15; Creatinine, Ser 1.16; Hemoglobin 13.5; Platelets 230; Potassium 3.4; Sodium 139   Recent Lipid Panel    Component Value Date/Time   CHOL 121 08/03/2021 1155   CHOL 128 09/24/2019 1009   TRIG 51.0 08/03/2021 1155   HDL 39.20  08/03/2021 1155   HDL 45 09/24/2019 1009   CHOLHDL 3 08/03/2021 1155   VLDL 10.2 08/03/2021 1155   LDLCALC 72 08/03/2021 1155   LDLCALC 71 09/24/2019 1009    Physical Exam:   VS:  There were no vitals taken for this visit. , BMI There is no height or weight on file to calculate BMI. GENERAL:  Well appearing, overweight HEENT: Pupils equal round and reactive, fundi not visualized, oral mucosa unremarkable NECK:  No jugular venous distention, waveform within normal limits, carotid upstroke brisk and symmetric, no bruits, no thyromegaly LYMPHATICS:  No cervical adenopathy LUNGS:  Clear to auscultation bilaterally HEART:  RRR.  PMI not displaced or sustained,S1 and S2 within normal limits, no S3, no S4, no clicks, no rubs, murmurs ABD:  Flat, positive bowel sounds normal in frequency in pitch, no bruits, no rebound, no guarding, no midline pulsatile mass, no hepatomegaly, no splenomegaly EXT:  2 plus pulses throughout, no edema, no cyanosis no clubbing SKIN:  No rashes no nodules NEURO:  Cranial nerves II through XII grossly intact, motor grossly intact throughout PSYCH:  Cognitively intact, oriented to person place and time   ASSESSMENT/PLAN:    HTN -  Patient concerned for side effects with Spironolactone, Chlorthalidone, HCTZ which he previously self discontinued.  However he never truly experienced symptoms on any of these medications.  Is now motivated to improve control of his blood pressure.  We will stop valsartan and start amlodipine-valsartan-HCTZ 10 - 320 - 25 mg daily.  Continue hydralazine 50 mg 3 times daily.  Ultimately for easier medication management may be worthwhile to transition hydralazine to twice daily at follow up if notable improvement in BP. BMP/TSH in 1 week.  ED - no current symptoms. F/u with urology.  Would be reasonable to trial tadalafil or sildenafil if needed.  Obesity - Weight loss via diet and exercise encouraged. Discussed the impact being overweight  would have on cardiovascular risk.  Discuss possible referral to PREP at follow-up.  Screening for Secondary Hypertension:     08/22/2020   10:08 AM  Causes  Drugs/Herbals Screened     - Comments Discussed avoiding NSAIDs.  Use Tylenol only.  Renovascular HTN Screened     - Comments Check renal artery Dopplers  Sleep Apnea Screened     - Comments No symptoms  Thyroid Disease Screened  Hyperaldosteronism Not Screened     - Comments Check renin and aldosterone at a later time.  Blood pressure remains elevated on spironolactone making this less likely.  Pheochromocytoma N/A  Cushing's Syndrome N/A  Hyperparathyroidism N/A  Coarctation of the Aorta Screened     - Comments Blood pressures are different in each arm by 8 mmHg.  Continue to monitor.  Compliance Screened     - Comments Reports that he is taking all his medicine except his hydrochlorothiazide.  He does forget to take them at times.  He feels worse when he does  not take his medicine.    Relevant Labs/Studies:    Latest Ref Rng & Units 09/20/2021   10:12 AM 08/03/2021   11:55 AM 01/12/2021   11:34 AM  Basic Labs  Sodium 135 - 145 mmol/L 139  138  138   Potassium 3.5 - 5.1 mmol/L 3.4  3.5  3.9   Creatinine 0.61 - 1.24 mg/dL 1.16  1.07  1.21        Latest Ref Rng & Units 02/02/2020    9:23 AM 10/24/2016   10:34 AM  Thyroid   TSH 0.450 - 4.500 uIU/mL 1.370  2.210                 09/05/2020    8:45 AM  Renovascular   Renal Artery [place] Completed Yes      Disposition:    FU with Dr. Oval Linsey or Loel Dubonnet, NP or Tommy Medal, [facility] in 4-6 weeks   Medication Adjustments/Labs and Tests Ordered: Current medicines are reviewed at length with the patient today.  Concerns regarding medicines are outlined above.  No orders of the defined types were placed in this encounter.  No orders of the defined types were placed in this encounter.    Signed, Loel Dubonnet, NP  11/07/2021 7:52 AM    [place]

## 2021-11-08 ENCOUNTER — Other Ambulatory Visit: Payer: Self-pay

## 2021-11-08 ENCOUNTER — Encounter: Payer: Self-pay | Admitting: Gastroenterology

## 2021-11-08 ENCOUNTER — Ambulatory Visit (AMBULATORY_SURGERY_CENTER): Payer: Self-pay | Admitting: *Deleted

## 2021-11-08 VITALS — Ht 69.0 in | Wt 216.0 lb

## 2021-11-08 DIAGNOSIS — Z1211 Encounter for screening for malignant neoplasm of colon: Secondary | ICD-10-CM

## 2021-11-08 MED ORDER — NA SULFATE-K SULFATE-MG SULF 17.5-3.13-1.6 GM/177ML PO SOLN: 1.0000 | Freq: Once | ORAL | 0 refills | Status: AC

## 2021-11-08 NOTE — Progress Notes (Signed)
Pre visit completed in person.  No egg or soy allergy known to patient  No issues known to pt with past sedation with any surgeries or procedures Patient denies ever being told they had issues or difficulty with intubation  No FH of Malignant Hyperthermia Pt is not on diet pills Pt is not on  home 02  Pt is not on blood thinners  Pt denies issues with constipation  No A fib or A flutter  Pt instructed to use Singlecare.com or GoodRx for a price reduction on prep   

## 2021-11-09 ENCOUNTER — Encounter: Payer: BC Managed Care – PPO | Admitting: Family Medicine

## 2021-11-20 ENCOUNTER — Encounter: Payer: Self-pay | Admitting: Certified Registered Nurse Anesthetist

## 2021-11-27 ENCOUNTER — Encounter: Payer: Self-pay | Admitting: Gastroenterology

## 2021-11-27 ENCOUNTER — Ambulatory Visit (AMBULATORY_SURGERY_CENTER): Payer: BC Managed Care – PPO | Admitting: Gastroenterology

## 2021-11-27 VITALS — BP 112/64 | HR 80 | Temp 97.5°F | Resp 14 | Ht 69.0 in | Wt 216.0 lb

## 2021-11-27 DIAGNOSIS — D128 Benign neoplasm of rectum: Secondary | ICD-10-CM

## 2021-11-27 DIAGNOSIS — D120 Benign neoplasm of cecum: Secondary | ICD-10-CM

## 2021-11-27 DIAGNOSIS — Z1211 Encounter for screening for malignant neoplasm of colon: Secondary | ICD-10-CM | POA: Diagnosis not present

## 2021-11-27 DIAGNOSIS — K621 Rectal polyp: Secondary | ICD-10-CM | POA: Diagnosis not present

## 2021-11-27 DIAGNOSIS — K5730 Diverticulosis of large intestine without perforation or abscess without bleeding: Secondary | ICD-10-CM

## 2021-11-27 DIAGNOSIS — K640 First degree hemorrhoids: Secondary | ICD-10-CM

## 2021-11-27 MED ORDER — SODIUM CHLORIDE 0.9 % IV SOLN: 500.0000 mL | Freq: Once | INTRAVENOUS | Status: DC

## 2021-11-27 NOTE — Progress Notes (Signed)
8350 Ephedrine 10 mg given IV due to low BP, MD updated.

## 2021-11-27 NOTE — Progress Notes (Signed)
Called to room to assist during endoscopic procedure.  Patient ID and intended procedure confirmed with present staff. Received instructions for my participation in the procedure from the performing physician.  

## 2021-11-27 NOTE — Progress Notes (Signed)
Pt's states no medical or surgical changes since previsit or office visit. 

## 2021-11-27 NOTE — Progress Notes (Signed)
Report given to PACU, vss 

## 2021-11-27 NOTE — Patient Instructions (Signed)
Handout on polyps, hemorrhoids, and diverticulosis  provided   Await pathology results.   Continue current medications.   YOU HAD AN ENDOSCOPIC PROCEDURE TODAY AT [facility]:   Refer to the procedure report that was given to you for any specific questions about what was found during the examination.  If the procedure report does not answer your questions, please call your gastroenterologist to clarify.  If you requested that your care partner not be given the details of your procedure findings, then the procedure report has been included in a sealed envelope for you to review at your convenience later.  YOU SHOULD EXPECT: Some feelings of bloating in the abdomen. Passage of more gas than usual.  Walking can help get rid of the air that was put into your GI tract during the procedure and reduce the bloating. If you had a lower endoscopy (such as a colonoscopy or flexible sigmoidoscopy) you may notice spotting of blood in your stool or on the toilet paper. If you underwent a bowel prep for your procedure, you may not have a normal bowel movement for a few days.  Please Note:  You might notice some irritation and congestion in your nose or some drainage.  This is from the oxygen used during your procedure.  There is no need for concern and it should clear up in a day or so.  SYMPTOMS TO REPORT IMMEDIATELY:  Following lower endoscopy (colonoscopy or flexible sigmoidoscopy):  Excessive amounts of blood in the stool  Significant tenderness or worsening of abdominal pains  Swelling of the abdomen that is new, acute  Fever of 100F or higher   For urgent or emergent issues, a gastroenterologist can be reached at any hour by calling [phone]. Do not use MyChart messaging for urgent concerns.    DIET:  We do recommend a small meal at first, but then you may proceed to your regular diet.  Drink plenty of fluids but you should avoid alcoholic beverages for 24 hours.  ACTIVITY:   You should plan to take it easy for the rest of today and you should NOT DRIVE or use heavy machinery until tomorrow (because of the sedation medicines used during the test).    FOLLOW UP: Our staff will call the number listed on your records the next business day following your procedure.  We will call around 7:15- 8:00 am to check on you and address any questions or concerns that you may have regarding the information given to you following your procedure. If we do not reach you, we will leave a message.  If you develop any symptoms (ie: fever, flu-like symptoms, shortness of breath, cough etc.) before then, please call [phone].  If you test positive for Covid 19 in the 2 weeks post procedure, please call and report this information to [place].    If any biopsies were taken you will be contacted by phone or by letter within the next 1-3 weeks.  Please call us at [phone] if you have not heard about the biopsies in 3 weeks.    SIGNATURES/CONFIDENTIALITY: You and/or your care partner have signed paperwork which will be entered into your electronic medical record.  These signatures attest to the fact that that the information above on your After Visit Summary has been reviewed and is understood.  Full responsibility of the confidentiality of this discharge information lies with you and/or your care-partner.

## 2021-11-27 NOTE — Op Note (Signed)
[facility] Patient Name: Kevin Jarvis Procedure Date: 11/27/2021 9:32 AM MRN: [id number] Endoscopist: Gerrit Heck , MD Age: 56 Referring MD:  Date of Birth: [birth year] Gender: Male Account #: [id number] Procedure:                Colonoscopy Indications:              Screening for colorectal malignant neoplasm, This                            is the patient's first colonoscopy Medicines:                Monitored Anesthesia Care Procedure:                Pre-Anesthesia Assessment:                           - Prior to the procedure, a History and Physical                            was performed, and patient medications and                            allergies were reviewed. The patient's tolerance of                            previous anesthesia was also reviewed. The risks                            and benefits of the procedure and the sedation                            options and risks were discussed with the patient.                            All questions were answered, and informed consent                            was obtained. Prior Anticoagulants: The patient has                            taken no previous anticoagulant or antiplatelet                            agents. ASA Grade Assessment: II - A patient with                            mild systemic disease. After reviewing the risks                            and benefits, the patient was deemed in                            satisfactory condition to undergo the procedure.  After obtaining informed consent, the colonoscope                            was passed under direct vision. Throughout the                            procedure, the patient's blood pressure, pulse, and                            oxygen saturations were monitored continuously. The                            CF [id number] #[id number] was introduced through the anus                            and advanced to the the  terminal ileum. The                            colonoscopy was performed without difficulty. The                            patient tolerated the procedure well. The quality                            of the bowel preparation was good. The terminal                            ileum, ileocecal valve, appendiceal orifice, and                            rectum were photographed. Scope In: 9:36:41 AM Scope Out: 9:56:39 AM Scope Withdrawal Time: 0 hours 18 minutes 18 seconds  Total Procedure Duration: 0 hours 19 minutes 58 seconds  Findings:                 The perianal and digital rectal examinations were                            normal.                           Three sessile polyps were found in the cecum. The                            polyps were 2 to 6 mm in size. These polyps were                            removed with a cold snare. Resection and retrieval                            were complete. Estimated blood loss was minimal.                           A few small-mouthed diverticula were found in the  ascending colon.                           A few sessile polyps were found in the rectum. The                            polyps were 1 to 2 mm in size. Several of these                            polyps were removed with a cold snare for                            histologic representative evaluation. Resection and                            retrieval were complete. Estimated blood loss was                            minimal.                           Non-bleeding internal hemorrhoids were found during                            retroflexion. The hemorrhoids were small.                           The terminal ileum appeared normal. Complications:            No immediate complications. Estimated Blood Loss:     Estimated blood loss was minimal. Impression:               - Three 2 to 6 mm polyps in the cecum, removed with                            a cold  snare. Resected and retrieved.                           - Diverticulosis in the ascending colon.                           - A few 1 to 2 mm benign appearing polyps in the                            rectum. Several were removed with a cold snare.                            Resected and retrieved.                           - Non-bleeding internal hemorrhoids.                           - The examined portion of the ileum was normal. Recommendation:           -  Patient has a contact number available for                            emergencies. The signs and symptoms of potential                            delayed complications were discussed with the                            patient. Return to normal activities tomorrow.                            Written discharge instructions were provided to the                            patient.                           - Resume previous diet.                           - Continue present medications.                           - Await pathology results.                           - Repeat colonoscopy for surveillance based on                            pathology results.                           - Return to GI office PRN. Gerrit Heck, MD 11/27/2021 10:02:13 AM

## 2021-11-27 NOTE — Progress Notes (Signed)
0930 EKG reviewed with previous 12 lead, no acute noted.  MD updated. vss

## 2021-11-27 NOTE — Progress Notes (Signed)
GASTROENTEROLOGY PROCEDURE H&P NOTE   Primary Care Physician: Wendie Agreste, MD    Reason for Procedure:  Colon Cancer screening  Plan:    Colonoscopy  Patient is appropriate for endoscopic procedure(s) in the ambulatory ([facility]) setting.  The nature of the procedure, as well as the risks, benefits, and alternatives were carefully and thoroughly reviewed with the patient. Ample time for discussion and questions allowed. The patient understood, was satisfied, and agreed to proceed.     HPI: Kevin Jarvis is a 56 y.o. male who presents for colonoscopy for routine Colon Cancer screening.  No active GI symptoms.  No known family history of colon cancer or related malignancy.  Patient is otherwise without complaints or active issues today.  Past Medical History:  Diagnosis Date   Anxiety    Arthritis    Hypertension     History reviewed. No pertinent surgical history.  Prior to Admission medications   Medication Sig Start Date End Date Taking? Authorizing Provider  ACCU-CHEK GUIDE test strip  10/10/20  Yes [provider]  Accu-Chek Softclix Lancets lancets 2 (two) times daily. 10/10/20  Yes [provider]  blood glucose meter kit and supplies Dispense based on patient and insurance preference. Use 2 times daily - fasting or 2 hours after meals. 10/06/20  Yes Wendie Agreste, MD  hydrALAZINE (APRESOLINE) 50 MG tablet Take 1 tablet (50 mg total) by mouth 3 (three) times daily. 09/20/21 09/15/22 Yes Loel Dubonnet, NP  metFORMIN (GLUCOPHAGE-XR) 500 MG 24 hr tablet Take 2 tablets (1,000 mg total) by mouth daily with breakfast. 08/03/21  Yes Wendie Agreste, MD  Olmesartan-amLODIPine-HCTZ 40-10-25 MG TABS Take 1 tablet by mouth daily. 10/11/21  Yes Loel Dubonnet, NP  Insulin Pen Needle (PEN NEEDLES) 32G X 4 MM MISC Use with solostar pen daily. Patient not taking: Reported on 09/07/2021 10/06/20   Wendie Agreste, MD  prochlorperazine (COMPAZINE) 10 MG tablet  Take 1 tablet (10 mg total) by mouth 2 (two) times daily as needed for nausea or vomiting (headache--for headache can also take with 76m of benadryl). Patient not taking: Reported on 10/11/2021 09/20/21   SGareth Morgan MD    Current Outpatient Medications  Medication Sig Dispense Refill   ACCU-CHEK GUIDE test strip      Accu-Chek Softclix Lancets lancets 2 (two) times daily.     blood glucose meter kit and supplies Dispense based on patient and insurance preference. Use 2 times daily - fasting or 2 hours after meals. 1 each 0   hydrALAZINE (APRESOLINE) 50 MG tablet Take 1 tablet (50 mg total) by mouth 3 (three) times daily. 270 tablet 3   metFORMIN (GLUCOPHAGE-XR) 500 MG 24 hr tablet Take 2 tablets (1,000 mg total) by mouth daily with breakfast. 180 tablet 1   Olmesartan-amLODIPine-HCTZ 40-10-25 MG TABS Take 1 tablet by mouth daily. 90 tablet 3   Insulin Pen Needle (PEN NEEDLES) 32G X 4 MM MISC Use with solostar pen daily. (Patient not taking: Reported on 09/07/2021) 100 each 3   prochlorperazine (COMPAZINE) 10 MG tablet Take 1 tablet (10 mg total) by mouth 2 (two) times daily as needed for nausea or vomiting (headache--for headache can also take with 21mof benadryl). (Patient not taking: Reported on 10/11/2021) 10 tablet 0   Current Facility-Administered Medications  Medication Dose Route Frequency Provider Last Rate Last Admin   0.9 %  sodium chloride infusion  500 mL Intravenous Once Armanii Pressnell, ViMcMullinDO  Allergies as of 11/27/2021 - Review Complete 11/27/2021  Allergen Reaction Noted   Chlorthalidone Other (See Comments) 10/18/2019    Family History  Problem Relation Age of Onset   Colon polyps Neg Hx    Colon cancer Neg Hx    Esophageal cancer Neg Hx    Stomach cancer Neg Hx    Rectal cancer Neg Hx     Social History   Socioeconomic History   Marital status: Married    Spouse name: Not on file   Number of children: 2   Years of education: Not on file   Highest  education level: Not on file  Occupational History   Occupation: Glass blower/designer  Tobacco Use   Smoking status: Never   Smokeless tobacco: Never  Vaping Use   Vaping Use: Never used  Substance and Sexual Activity   Alcohol use: Yes    Comment: Social   Drug use: No   Sexual activity: Yes  Other Topics Concern   Not on file  Social History Narrative   From [place] - Moved to [place] 2000   Lives with wife and son   Social Determinants of Radio broadcast assistant Strain: Not on file  Food Insecurity: Not on file  Transportation Needs: Not on file  Physical Activity: Not on file  Stress: Not on file  Social Connections: Not on file  Intimate Partner Violence: Not on file    Physical Exam: Vital signs in last 24 hours: _0  (!) 162/87   Pulse 83   Temp (!) 97.5 F (36.4 C)   Ht _1  (1.753 m)   Wt 216 lb (98 kg)   SpO2 98%   BMI 31.90 kg/m  GEN: NAD EYE: Sclerae anicteric ENT: MMM CV: Non-tachycardic Pulm: CTA b/l GI: Soft, NT/ND NEURO:  Alert & Oriented x 3   Gerrit Heck, DO [facility]   11/27/2021 9:30 AM

## 2021-11-28 ENCOUNTER — Telehealth: Payer: Self-pay

## 2021-11-28 NOTE — Telephone Encounter (Signed)
Left message on answering machine. 

## 2021-12-07 ENCOUNTER — Encounter: Payer: Self-pay | Admitting: Gastroenterology

## 2021-12-24 ENCOUNTER — Encounter: Payer: Self-pay | Admitting: Family Medicine

## 2021-12-24 ENCOUNTER — Ambulatory Visit (INDEPENDENT_AMBULATORY_CARE_PROVIDER_SITE_OTHER): Payer: BC Managed Care – PPO | Admitting: Family Medicine

## 2021-12-24 VITALS — BP 140/82 | HR 74 | Temp 98.1°F | Ht 69.0 in | Wt 214.8 lb

## 2021-12-24 DIAGNOSIS — Z23 Encounter for immunization: Secondary | ICD-10-CM

## 2021-12-24 DIAGNOSIS — H538 Other visual disturbances: Secondary | ICD-10-CM

## 2021-12-24 DIAGNOSIS — Z0000 Encounter for general adult medical examination without abnormal findings: Secondary | ICD-10-CM | POA: Diagnosis not present

## 2021-12-24 DIAGNOSIS — E1165 Type 2 diabetes mellitus with hyperglycemia: Secondary | ICD-10-CM | POA: Diagnosis not present

## 2021-12-24 DIAGNOSIS — I10 Essential (primary) hypertension: Secondary | ICD-10-CM | POA: Diagnosis not present

## 2021-12-24 DIAGNOSIS — Z794 Long term (current) use of insulin: Secondary | ICD-10-CM

## 2021-12-24 LAB — COMPREHENSIVE METABOLIC PANEL
ALT: 21 U/L (ref 0–53)
AST: 20 U/L (ref 0–37)
Albumin: 4.6 g/dL (ref 3.5–5.2)
Alkaline Phosphatase: 51 U/L (ref 39–117)
BUN: 15 mg/dL (ref 6–23)
CO2: 30 mEq/L (ref 19–32)
Calcium: 10.1 mg/dL (ref 8.4–10.5)
Chloride: 100 mEq/L (ref 96–112)
Creatinine, Ser: 1.1 mg/dL (ref 0.40–1.50)
GFR: 74.49 mL/min (ref >60.00)
Glucose, Bld: 115 mg/dL — ABNORMAL HIGH (ref 70–99)
Potassium: 3.7 mEq/L (ref 3.5–5.1)
Sodium: 139 mEq/L (ref 135–145)
Total Bilirubin: 0.4 mg/dL (ref 0.2–1.2)
Total Protein: 8.1 g/dL (ref 6.0–8.3)

## 2021-12-24 LAB — MICROALBUMIN / CREATININE URINE RATIO
Creatinine,U: 203.1 mg/dL
Microalb Creat Ratio: 1.2 mg/g (ref 0.0–30.0)
Microalb, Ur: 2.5 mg/dL — ABNORMAL HIGH (ref 0.0–1.9)

## 2021-12-24 LAB — LIPID PANEL
Cholesterol: 123 mg/dL (ref 0–200)
HDL: 43.8 mg/dL (ref >39.00)
LDL Cholesterol: 71 mg/dL (ref 0–99)
NonHDL: 79.03
Total CHOL/HDL Ratio: 3
Triglycerides: 42 mg/dL (ref 0.0–149.0)
VLDL: 8.4 mg/dL (ref 0.0–40.0)

## 2021-12-24 LAB — TSH: TSH: 1.44 u[IU]/mL (ref 0.35–5.50)

## 2021-12-24 NOTE — Progress Notes (Unsigned)
Subjective:  Patient ID: Kevin Jarvis, male    DOB: 10-17-1965  Age: 56 y.o. MRN: [id number]  CC:  Chief Complaint  Patient presents with   Annual Exam    Pt states all is well    HPI Kevin Jarvis presents for Annual Exam PCP, [facility], Dr. Einar Gip Also followed in advanced hypertension clinic for HTN, visit noted from July 13, Kevin [place], NP.  Valsartan was discontinued, started amlodipine valsartan HCTZ 10/325/20 5 mg daily and continue hydralazine 50 mg 3 times daily.  Plan for BMP, TSH 1 week later, has not yet been drawn.  Diabetes: New onset July 2022.  Metformin 10106m QAM Home readings: below 120. No sx lows. Lowest 75.   On ARB.  Microalbumin: nl ratio in July 2022.  Optho, foot exam, pneumovax: optho due.   Lab Results  Component Value Date   HGBA1C 6.4 (A) 08/03/2021   HGBA1C 6.1 (A) 01/12/2021   HGBA1C 16.3 (H) 10/17/2020   Lab Results  Component Value Date   MICROALBUR 1.1 10/12/2020   LDLCALC 72 08/03/2021   CREATININE 1.16 09/20/2021   Erectile dysfunction: Taking sildenafil at times. Helps some. Some flushing, no heating or vision changes. No CP or dyspnea with exertion.         12/24/2021   10:42 AM 09/07/2021   10:31 AM 08/03/2021   10:59 AM 04/13/2021   10:38 AM 01/12/2021    9:57 AM  Depression screen PHQ 2/9  Decreased Interest 0 0 0 0 0  Down, Depressed, Hopeless 0 0 0 0 0  PHQ - 2 Score 0 0 0 0 0  Altered sleeping 0 0 1 2   Tired, decreased energy 0 0 0 1   Change in appetite 0 0 0 0   Feeling bad or failure about yourself  0 0 0 0   Trouble concentrating 0 0 0 0   Moving slowly or fidgety/restless 0 0 0 0   Suicidal thoughts 0 0 0 0   PHQ-9 Score 0 0 1 3   Difficult doing work/chores  Not difficult at all Not difficult at all      Health Maintenance  Topic Date Due   OPHTHALMOLOGY EXAM  07/12/2021   Diabetic kidney evaluation - Urine ACR  10/12/2021   INFLUENZA VACCINE  10/30/2021   FOOT EXAM  12/06/2021   HEMOGLOBIN  A1C  02/03/2022   Diabetic kidney evaluation - GFR measurement  09/21/2022   TETANUS/TDAP  08/06/2023   COLONOSCOPY (Pts 45-463yrInsurance coverage will need to be confirmed)  11/28/2026   Hepatitis C Screening  Completed   HIV Screening  Completed   Zoster Vaccines- Shingrix  Completed   HPV VACCINES  Aged Out   COVID-19 Vaccine  Discontinued  Colonoscopy: 11/27/21 Prostate: does NOT have family history of prostate cancer The natural history of prostate cancer and ongoing controversy regarding screening and potential treatment outcomes of prostate cancer has been discussed with the patient. The meaning of a false positive PSA and a false negative PSA has been discussed. He indicates understanding of the limitations of this screening test and wishes to proceed with screening PSA testing. Lab Results  Component Value Date   PSA1 1.4 09/24/2019   PSA1 1.4 10/24/2016   PSA 1.14 04/03/2015   PSA 1.81 08/05/2013    Immunization History  Administered Date(s) Administered   Influenza Split 12/12/2011   Influenza,inj,Quad PF,6+ Mos 04/15/2014, 12/06/2020   Janssen (J&J) SARS-COV-2 Vaccination 07/21/2019   PFIZER Comirnaty(Gray  Top)Covid-19 Tri-Sucrose Vaccine 11/08/2020   PNEUMOCOCCAL CONJUGATE-20 10/12/2020   Pneumococcal Polysaccharide-23 12/06/2020   Tdap 08/05/2013   Zoster Recombinat (Shingrix) 10/12/2020, 01/12/2021  Flu vaccine today.  Covid booster - recommended at pharmacy.   No results found. Optho - appt due, yearly visit. Some blurry vision at times in the morning - notes with working longer hours.  Hx of hypertensive retinopathy, retinal edema, diabetic retinopathy.  Dental: last visit last year.   Alcohol: 1 drink every few weeks.   Tobacco: none.   Exercise: minimal - walking.   Applied to [employer] - may need health questionnaire, and copies of medical records.     History Patient Active Problem List   Diagnosis Date Noted   Eales disease, left  02/05/2021   Eales disease of right eye 02/05/2021   Eales disease of both eyes 01/22/2021   Hypertensive retinopathy of both eyes 11/27/2020   Retinal edema of both eyes 11/27/2020   Mild nonproliferative diabetic retinopathy of both eyes ([place]) 11/27/2020   Type 2 diabetes mellitus with hyperglycemia ([place]) 10/26/2020   Iron deficiency anemia 06/07/2017   Anxiety 12/12/2011   Long term use of drug 12/12/2011   Hypertension 12/12/2011   Sleep disorder, shift work 12/12/2011   Headache(784.0) 12/12/2011   Past Medical History:  Diagnosis Date   Anxiety    Arthritis    Hypertension    History reviewed. No pertinent surgical history. Allergies  Allergen Reactions   Chlorthalidone Other (See Comments)    lightheaded   Prior to Admission medications   Medication Sig Start Date End Date Taking? Authorizing Provider  ACCU-CHEK GUIDE test strip  10/10/20  Yes [provider]  Accu-Chek Softclix Lancets lancets 2 (two) times daily. 10/10/20  Yes [provider]  blood glucose meter kit and supplies Dispense based on patient and insurance preference. Use 2 times daily - fasting or 2 hours after meals. 10/06/20  Yes Wendie Agreste, MD  hydrALAZINE (APRESOLINE) 50 MG tablet Take 1 tablet (50 mg total) by mouth 3 (three) times daily. 09/20/21 09/15/22 Yes Loel Dubonnet, NP  metFORMIN (GLUCOPHAGE-XR) 500 MG 24 hr tablet Take 2 tablets (1,000 mg total) by mouth daily with breakfast. 08/03/21  Yes Wendie Agreste, MD  Olmesartan-amLODIPine-HCTZ 40-10-25 MG TABS Take 1 tablet by mouth daily. 10/11/21  Yes Loel Dubonnet, NP  Insulin Pen Needle (PEN NEEDLES) 32G X 4 MM MISC Use with solostar pen daily. Patient not taking: Reported on 09/07/2021 10/06/20   Wendie Agreste, MD  prochlorperazine (COMPAZINE) 10 MG tablet Take 1 tablet (10 mg total) by mouth 2 (two) times daily as needed for nausea or vomiting (headache--for headache can also take with 78m of benadryl). Patient not  taking: Reported on 10/11/2021 09/20/21   SGareth Morgan MD   Social History   Socioeconomic History   Marital status: Married    Spouse name: Not on file   Number of children: 2   Years of education: Not on file   Highest education level: Not on file  Occupational History   Occupation: mGlass blower/designer Tobacco Use   Smoking status: Never   Smokeless tobacco: Never  Vaping Use   Vaping Use: Never used  Substance and Sexual Activity   Alcohol use: Yes    Comment: Social   Drug use: No   Sexual activity: Yes  Other Topics Concern   Not on file  Social History Narrative   [id number]  Lives with wife and son   Social Determinants of Health   Financial Resource Strain: Not on file  Food Insecurity: Not on file  Transportation Needs: Not on file  Physical Activity: Not on file  Stress: Not on file  Social Connections: Not on file  Intimate Partner Violence: Not on file    Review of Systems 13 point review of systems per patient health survey noted.  Negative other than as indicated above or in HPI.    Objective:   Vitals:   12/24/21 1046  BP: (!) 158/80  Pulse: 74  Temp: 98.1 F (36.7 C)  SpO2: 100%  Weight: 214 lb 12.8 oz (97.4 kg)  Height: 5' 9"  (1.753 m)   {Vitals History (Optional):23777}  Physical Exam Vitals reviewed.  Constitutional:      Appearance: He is well-developed.  HENT:     Head: Normocephalic and atraumatic.  Eyes:     Extraocular Movements: Extraocular movements intact.     Conjunctiva/sclera: Conjunctivae normal.     Pupils: Pupils are equal, round, and reactive to light.  Neck:     Vascular: No carotid bruit or JVD.  Cardiovascular:     Rate and Rhythm: Normal rate and regular rhythm.     Heart sounds: Normal heart sounds. No murmur heard. Pulmonary:     Effort: Pulmonary effort is normal.     Breath sounds: Normal breath sounds. No rales.  Musculoskeletal:     Right lower leg: No edema.     Left lower  leg: No edema.  Skin:    General: Skin is warm and dry.  Neurological:     General: No focal deficit present.     Mental Status: He is alert and oriented to person, place, and time.  Psychiatric:        Mood and Affect: Mood normal.        Assessment & Plan:  Geordie Nooney is a 56 y.o. male . Need for influenza vaccination - Plan: Flu Vaccine QUAD 6+ mos PF IM (Fluarix Quad PF)   No orders of the defined types were placed in this encounter.  There are no Patient Instructions on file for this visit.    Signed,   Merri Ray, MD [place] 12/24/21 10:51 AM

## 2021-12-24 NOTE — Patient Instructions (Addendum)
Call your eye specialist as soon as possible for appointment for the blurry vision as you have had problems in the eyes form blood pressure and diabetes in the past. I will check some labs today.  Return to the clinic or go to the nearest emergency room if any of your symptoms worsen or new symptoms occur. Talk to your cardiologist first about exercise, but recommend 150 minutes per week of low to moderate intensity exercise.  Blood pressure is elevated today - I will check labs as planned by your blood pressure specialist, but follow up with them to discuss further med changes.  Return to the clinic or go to the nearest emergency room if any of your symptoms worsen or new symptoms occur.    Preventive Care 57-82 Years Old, Male Preventive care refers to lifestyle choices and visits with your health care provider that can promote health and wellness. Preventive care visits are also called wellness exams. What can I expect for my preventive care visit? Counseling During your preventive care visit, your health care provider may ask about your: Medical history, including: Past medical problems. Family medical history. Current health, including: Emotional well-being. Home life and relationship well-being. Sexual activity. Lifestyle, including: Alcohol, nicotine or tobacco, and drug use. Access to firearms. Diet, exercise, and sleep habits. Safety issues such as seatbelt and bike helmet use. Sunscreen use. Work and work Statistician. Physical exam Your health care provider will check your: Height and weight. These may be used to calculate your BMI (body mass index). BMI is a measurement that tells if you are at a healthy weight. Waist circumference. This measures the distance around your waistline. This measurement also tells if you are at a healthy weight and may help predict your risk of certain diseases, such as type 2 diabetes and high blood pressure. Heart rate and blood pressure. Body  temperature. Skin for abnormal spots. What immunizations do I need?  Vaccines are usually given at various ages, according to a schedule. Your health care provider will recommend vaccines for you based on your age, medical history, and lifestyle or other factors, such as travel or where you work. What tests do I need? Screening Your health care provider may recommend screening tests for certain conditions. This may include: Lipid and cholesterol levels. Diabetes screening. This is done by checking your blood sugar (glucose) after you have not eaten for a while (fasting). Hepatitis B test. Hepatitis C test. HIV (human immunodeficiency virus) test. STI (sexually transmitted infection) testing, if you are at risk. Lung cancer screening. Prostate cancer screening. Colorectal cancer screening. Talk with your health care provider about your test results, treatment options, and if necessary, the need for more tests. Follow these instructions at home: Eating and drinking  Eat a diet that includes fresh fruits and vegetables, whole grains, lean protein, and low-fat dairy products. Take vitamin and mineral supplements as recommended by your health care provider. Do not drink alcohol if your health care provider tells you not to drink. If you drink alcohol: Limit how much you have to 0-2 drinks a day. Know how much alcohol is in your drink. In the [place] one drink equals one 12 oz bottle of beer (355 mL), one 5 oz glass of wine (148 mL), or one 1 oz glass of hard liquor (44 mL). Lifestyle Brush your teeth every morning and night with fluoride toothpaste. Floss one time each day. Exercise for at least 30 minutes 5 or more days each week. Do not use  any products that contain nicotine or tobacco. These products include cigarettes, chewing tobacco, and vaping devices, such as e-cigarettes. If you need help quitting, ask your health care provider. Do not use drugs. If you are sexually active,  practice safe sex. Use a condom or other form of protection to prevent STIs. Take aspirin only as told by your health care provider. Make sure that you understand how much to take and what form to take. Work with your health care provider to find out whether it is safe and beneficial for you to take aspirin daily. Find healthy ways to manage stress, such as: Meditation, yoga, or listening to music. Journaling. Talking to a trusted person. Spending time with friends and family. Minimize exposure to UV radiation to reduce your risk of skin cancer. Safety Always wear your seat belt while driving or riding in a vehicle. Do not drive: If you have been drinking alcohol. Do not ride with someone who has been drinking. When you are tired or distracted. While texting. If you have been using any mind-altering substances or drugs. Wear a helmet and other protective equipment during sports activities. If you have firearms in your house, make sure you follow all gun safety procedures. What's next? Go to your health care provider once a year for an annual wellness visit. Ask your health care provider how often you should have your eyes and teeth checked. Stay up to date on all vaccines. This information is not intended to replace advice given to you by your health care provider. Make sure you discuss any questions you have with your health care provider. Document Revised: 09/13/2020 Document Reviewed: 09/13/2020 Elsevier Patient Education  [place].

## 2021-12-25 LAB — HEMOGLOBIN A1C: Hgb A1c MFr Bld: 6 % (ref 4.6–6.5)

## 2021-12-31 ENCOUNTER — Telehealth: Payer: Self-pay

## 2021-12-31 NOTE — Telephone Encounter (Signed)
-----   Message from Wendie Agreste, MD sent at 12/28/2021  8:25 PM EDT ----- Lab letter  Electrolytes were overall stable.  Previous low potassium is now normal.  Urine test for protein was borderline elevated.  Cholesterol levels are normal.  Diabetes test is improved at 6.0.  Thyroid test is normal.  Let me know if there are questions.  Dr. Carlota Raspberry

## 2021-12-31 NOTE — Telephone Encounter (Signed)
Left pt a vm stating lab results  

## 2022-02-07 ENCOUNTER — Encounter: Payer: BC Managed Care – PPO | Admitting: Family Medicine

## 2022-03-13 ENCOUNTER — Ambulatory Visit: Payer: BC Managed Care – PPO | Admitting: Family Medicine

## 2022-05-02 NOTE — Progress Notes (Signed)
This encounter was created in error - please disregard.

## 2022-05-29 ENCOUNTER — Encounter: Payer: Self-pay | Admitting: Cardiovascular Disease

## 2022-08-01 ENCOUNTER — Other Ambulatory Visit (HOSPITAL_BASED_OUTPATIENT_CLINIC_OR_DEPARTMENT_OTHER): Payer: Self-pay

## 2022-08-01 ENCOUNTER — Telehealth (HOSPITAL_BASED_OUTPATIENT_CLINIC_OR_DEPARTMENT_OTHER): Payer: Self-pay

## 2022-08-01 MED ORDER — OLMESARTAN-AMLODIPINE-HCTZ 40-10-25 MG PO TABS
1.0000 | ORAL_TABLET | Freq: Every day | ORAL | 3 refills | Status: DC
  Filled 2022-08-01: qty 90, 90d supply, fill #0
  Filled 2022-08-01: qty 30, 30d supply, fill #0

## 2022-08-01 NOTE — Telephone Encounter (Signed)
[facility] able to get from central supply, will call patient to see if he is willing to pick up from here.   Called patient and he will come here to pick up! Rx sent!

## 2022-08-01 NOTE — Addendum Note (Signed)
Addended by: Marlene Lard on: 08/01/2022 02:43 PM   Modules accepted: Orders

## 2022-08-01 NOTE — Telephone Encounter (Signed)
Returned call to pharmacy, to check on Olm-amlo tablet and HCTZ,   They have olm- HCTZ 40-25. They do have the triple in 40-5-25. Checking with [facility] to see if they have in stock. [facility] does not have the triple combo tablet in stock.   Routing back to provider for review.

## 2022-08-01 NOTE — Telephone Encounter (Addendum)
No answer at pharmacy will call back!    ----- Message from Alver Sorrow, NP sent at 08/01/2022  1:11 PM EDT ----- Remind me... is there actually an Olmesartan-Amlodipine-HCTZ 40-10-25mg  shortage? Can call [facility] to see if they can get just the olmesartan-amlodipine and the HCtz as 2 separate tabs. Or can check with our pharmacy to see availability and have it mailed to him. Whichever is his preference.   TY! ----- Message ----- From: Leotis Pain Sent: 08/01/2022   1:02 PM EDT To: Alver Sorrow, NP; Marlene Lard, RN

## 2022-08-02 ENCOUNTER — Other Ambulatory Visit (HOSPITAL_BASED_OUTPATIENT_CLINIC_OR_DEPARTMENT_OTHER): Payer: Self-pay

## 2022-08-02 ENCOUNTER — Other Ambulatory Visit: Payer: Self-pay

## 2022-08-05 ENCOUNTER — Other Ambulatory Visit (HOSPITAL_BASED_OUTPATIENT_CLINIC_OR_DEPARTMENT_OTHER): Payer: Self-pay

## 2022-08-06 ENCOUNTER — Other Ambulatory Visit (HOSPITAL_BASED_OUTPATIENT_CLINIC_OR_DEPARTMENT_OTHER): Payer: Self-pay

## 2022-10-28 ENCOUNTER — Other Ambulatory Visit (HOSPITAL_BASED_OUTPATIENT_CLINIC_OR_DEPARTMENT_OTHER): Payer: Self-pay | Admitting: Family

## 2022-10-28 ENCOUNTER — Other Ambulatory Visit: Payer: Self-pay | Admitting: Family Medicine

## 2022-10-28 DIAGNOSIS — R519 Headache, unspecified: Secondary | ICD-10-CM

## 2022-10-28 DIAGNOSIS — I10 Essential (primary) hypertension: Secondary | ICD-10-CM

## 2023-01-14 ENCOUNTER — Telehealth: Payer: Self-pay | Admitting: Family Medicine

## 2023-01-14 ENCOUNTER — Other Ambulatory Visit (HOSPITAL_BASED_OUTPATIENT_CLINIC_OR_DEPARTMENT_OTHER): Payer: Self-pay

## 2023-01-14 MED ORDER — OLMESARTAN-AMLODIPINE-HCTZ 40-10-25 MG PO TABS
1.0000 | ORAL_TABLET | Freq: Every day | ORAL | 0 refills | Status: DC
  Filled 2023-01-14: qty 90, 90d supply, fill #0

## 2023-01-14 NOTE — Telephone Encounter (Signed)
Sent Rx to [facility], informed patient this was completed

## 2023-01-14 NOTE — Telephone Encounter (Signed)
Encourage patient to contact the pharmacy for refills or they can request refills through [facility]  (Please schedule appointment if patient has not been seen in over a year)    WHAT PHARMACY WOULD THEY LIKE THIS SENT TO: [facility]   MEDICATION NAME & DOSE:  Olmesartan-amLODIPine-HCTZ 40-10-25 MG TABS  metFORMIN (GLUCOPHAGE-XR) 500 MG 24 hr tablet   NOTES/COMMENTS FROM PATIENT:      Front office please notify patient: It takes 48-72 hours to process rx refill requests Ask patient to call pharmacy to ensure rx is ready before heading there.

## 2023-01-20 ENCOUNTER — Other Ambulatory Visit (HOSPITAL_BASED_OUTPATIENT_CLINIC_OR_DEPARTMENT_OTHER): Payer: Self-pay | Admitting: Family

## 2023-01-20 DIAGNOSIS — R519 Headache, unspecified: Secondary | ICD-10-CM

## 2023-01-20 DIAGNOSIS — I10 Essential (primary) hypertension: Secondary | ICD-10-CM

## 2023-01-29 ENCOUNTER — Encounter: Payer: Self-pay | Admitting: Family Medicine

## 2023-01-29 ENCOUNTER — Ambulatory Visit: Payer: BC Managed Care – PPO | Admitting: Family Medicine

## 2023-01-29 VITALS — BP 144/80 | HR 77 | Temp 98.3°F | Ht 69.0 in | Wt 214.4 lb

## 2023-01-29 DIAGNOSIS — Z794 Long term (current) use of insulin: Secondary | ICD-10-CM | POA: Diagnosis not present

## 2023-01-29 DIAGNOSIS — E781 Pure hyperglyceridemia: Secondary | ICD-10-CM

## 2023-01-29 DIAGNOSIS — Z23 Encounter for immunization: Secondary | ICD-10-CM

## 2023-01-29 DIAGNOSIS — I10 Essential (primary) hypertension: Secondary | ICD-10-CM | POA: Diagnosis not present

## 2023-01-29 DIAGNOSIS — G4700 Insomnia, unspecified: Secondary | ICD-10-CM | POA: Diagnosis not present

## 2023-01-29 DIAGNOSIS — E1165 Type 2 diabetes mellitus with hyperglycemia: Secondary | ICD-10-CM | POA: Diagnosis not present

## 2023-01-29 LAB — COMPREHENSIVE METABOLIC PANEL
ALT: 27 U/L (ref 0–53)
AST: 23 U/L (ref 0–37)
Albumin: 4.6 g/dL (ref 3.5–5.2)
Alkaline Phosphatase: 56 U/L (ref 39–117)
BUN: 16 mg/dL (ref 6–23)
CO2: 32 meq/L (ref 19–32)
Calcium: 10.4 mg/dL (ref 8.4–10.5)
Chloride: 100 meq/L (ref 96–112)
Creatinine, Ser: 1.13 mg/dL (ref 0.40–1.50)
GFR: 71.57 mL/min (ref >60.00)
Glucose, Bld: 117 mg/dL — ABNORMAL HIGH (ref 70–99)
Potassium: 3.7 meq/L (ref 3.5–5.1)
Sodium: 138 meq/L (ref 135–145)
Total Bilirubin: 0.5 mg/dL (ref 0.2–1.2)
Total Protein: 8 g/dL (ref 6.0–8.3)

## 2023-01-29 LAB — HEMOGLOBIN A1C: Hgb A1c MFr Bld: 6.3 % (ref 4.6–6.5)

## 2023-01-29 LAB — LIPID PANEL
Cholesterol: 121 mg/dL (ref 0–200)
HDL: 44.4 mg/dL (ref >39.00)
LDL Cholesterol: 69 mg/dL (ref 0–99)
NonHDL: 77.05
Total CHOL/HDL Ratio: 3
Triglycerides: 40 mg/dL (ref 0.0–149.0)
VLDL: 8 mg/dL (ref 0.0–40.0)

## 2023-01-29 LAB — MICROALBUMIN / CREATININE URINE RATIO
Creatinine,U: 200.4 mg/dL
Microalb Creat Ratio: 2.3 mg/g (ref 0.0–30.0)
Microalb, Ur: 4.6 mg/dL — ABNORMAL HIGH (ref 0.0–1.9)

## 2023-01-29 MED ORDER — METFORMIN HCL ER 500 MG PO TB24: 1000.0000 mg | ORAL_TABLET | Freq: Every day | ORAL | 1 refills | Status: AC

## 2023-01-29 MED ORDER — HYDRALAZINE HCL 50 MG PO TABS: 50.0000 mg | ORAL_TABLET | Freq: Three times a day (TID) | ORAL | 1 refills | Status: DC

## 2023-01-29 MED ORDER — OLMESARTAN-AMLODIPINE-HCTZ 40-10-25 MG PO TABS
1.0000 | ORAL_TABLET | Freq: Every day | ORAL | 0 refills | Status: DC
Start: 2023-01-29 — End: 2023-07-17

## 2023-01-29 NOTE — Patient Instructions (Addendum)
Thanks for coming in today.  Follow up with eye doctor as planned.  Try using pill dispenser to help with remembering medication as well as an alarm on your phone can be helpful.  Recheck with me in 1 month.  Sooner if any new or worsening symptoms. See information below in sleep.  Melatonin is fine over-the-counter temporarily.  Will discuss this further at your follow-up visit.  If any snoring, or you have continued nighttime wakening we may need to have you see a sleep specialist to make sure you do not have sleep apnea.  Let me know if that occurs.  Take care!  Insomnia Insomnia is a sleep disorder that makes it difficult to fall asleep or stay asleep. Insomnia can cause fatigue, low energy, difficulty concentrating, mood swings, and poor performance at work or school. There are three different ways to classify insomnia: Difficulty falling asleep. Difficulty staying asleep. Waking up too early in the morning. Any type of insomnia can be long-term (chronic) or short-term (acute). Both are common. Short-term insomnia usually lasts for 3 months or less. Chronic insomnia occurs at least three times a week for longer than 3 months. What are the causes? Insomnia may be caused by another condition, situation, or substance, such as: Having certain mental health conditions, such as anxiety and depression. Using caffeine, alcohol, tobacco, or drugs. Having gastrointestinal conditions, such as gastroesophageal reflux disease (GERD). Having certain medical conditions. These include: Asthma. Alzheimer's disease. Stroke. Chronic pain. An overactive thyroid gland (hyperthyroidism). Other sleep disorders, such as restless legs syndrome and sleep apnea. Menopause. Sometimes, the cause of insomnia may not be known. What increases the risk? Risk factors for insomnia include: Gender. Females are affected more often than males. Age. Insomnia is more common as people get older. Stress and certain  medical and mental health conditions. Lack of exercise. Having an irregular work schedule. This may include working night shifts and traveling between different time zones. What are the signs or symptoms? If you have insomnia, the main symptom is having trouble falling asleep or having trouble staying asleep. This may lead to other symptoms, such as: Feeling tired or having low energy. Feeling nervous about going to sleep. Not feeling rested in the morning. Having trouble concentrating. Feeling irritable, anxious, or depressed. How is this diagnosed? This condition may be diagnosed based on: Your symptoms and medical history. Your health care provider may ask about: Your sleep habits. Any medical conditions you have. Your mental health. A physical exam. How is this treated? Treatment for insomnia depends on the cause. Treatment may focus on treating an underlying condition that is causing the insomnia. Treatment may also include: Medicines to help you sleep. Counseling or therapy. Lifestyle adjustments to help you sleep better. Follow these instructions at home: Eating and drinking  Limit or avoid alcohol, caffeinated beverages, and products that contain nicotine and tobacco, especially close to bedtime. These can disrupt your sleep. Do not eat a large meal or eat spicy foods right before bedtime. This can lead to digestive discomfort that can make it hard for you to sleep. Sleep habits  Keep a sleep diary to help you and your health care provider figure out what could be causing your insomnia. Write down: When you sleep. When you wake up during the night. How well you sleep and how rested you feel the next day. Any side effects of medicines you are taking. What you eat and drink. Make your bedroom a dark, comfortable place where it is  easy to fall asleep. Put up shades or blackout curtains to block light from outside. Use a white noise machine to block noise. Keep the  temperature cool. Limit screen use before bedtime. This includes: Not watching TV. Not using your smartphone, tablet, or computer. Stick to a routine that includes going to bed and waking up at the same times every day and night. This can help you fall asleep faster. Consider making a quiet activity, such as reading, part of your nighttime routine. Try to avoid taking naps during the day so that you sleep better at night. Get out of bed if you are still awake after 15 minutes of trying to sleep. Keep the lights down, but try reading or doing a quiet activity. When you feel sleepy, go back to bed. General instructions Take over-the-counter and prescription medicines only as told by your health care provider. Exercise regularly as told by your health care provider. However, avoid exercising in the hours right before bedtime. Use relaxation techniques to manage stress. Ask your health care provider to suggest some techniques that may work well for you. These may include: Breathing exercises. Routines to release muscle tension. Visualizing peaceful scenes. Make sure that you drive carefully. Do not drive if you feel very sleepy. Keep all follow-up visits. This is important. Contact a health care provider if: You are tired throughout the day. You have trouble in your daily routine due to sleepiness. You continue to have sleep problems, or your sleep problems get worse. Get help right away if: You have thoughts about hurting yourself or someone else. Get help right away if you feel like you may hurt yourself or others, or have thoughts about taking your own life. Go to your nearest emergency room or: Call 911. Call the National Suicide Prevention Lifeline at 859-011-7375 or 988. This is open 24 hours a day. Text the Crisis Text Line at 680-683-3641. Summary Insomnia is a sleep disorder that makes it difficult to fall asleep or stay asleep. Insomnia can be long-term (chronic) or short-term  (acute). Treatment for insomnia depends on the cause. Treatment may focus on treating an underlying condition that is causing the insomnia. Keep a sleep diary to help you and your health care provider figure out what could be causing your insomnia. This information is not intended to replace advice given to you by your health care provider. Make sure you discuss any questions you have with your health care provider. Document Revised: 02/26/2021 Document Reviewed: 02/26/2021 Elsevier Patient Education  2024 [employer]   Type 2 Diabetes Mellitus, Self-Care, Adult Caring for yourself after you have been diagnosed with type 2 diabetes (type 2 diabetes mellitus) means keeping your blood sugar (glucose) under control with a balance of: Nutrition. Exercise. Lifestyle changes. Medicines or insulin, if needed. Support from your team of health care providers and others. What are the risks? Having type 2 diabetes can put you at risk for other long-term (chronic) conditions, such as heart disease and kidney disease. Your health care provider may prescribe medicines to help prevent complications from diabetes. How to monitor your blood glucose  Check your blood glucose every day or as often as told by your health care provider. Have your A1C (hemoglobin A1C) level checked two or more times a year, or as often as told by your health care provider. Your health care provider will set personalized treatment goals for you. Generally, the goal of treatment is to maintain the following blood glucose levels: Before meals: 80-130  mg/dL (2.1-3.0 mmol/L). After meals: below 180 mg/dL (10 mmol/L). A1C level: less than 7%. How to manage hyperglycemia and hypoglycemia Hyperglycemia symptoms Hyperglycemia, also called high blood glucose, occurs when blood glucose is too high. Make sure you know the early signs of hyperglycemia, such as: Increased thirst. Hunger. Feeling very tired. Needing to urinate more  often than usual. Blurry vision. Hypoglycemia symptoms Hypoglycemia, also called low blood glucose, occurs with a blood glucose level at or below 70 mg/dL (3.9 mmol/L). Diabetes medicines lower your blood glucose and can cause hypoglycemia. The risk for hypoglycemia increases during or after exercise, during sleep, during illness, and when skipping meals or not eating for a long time (fasting). It is important to know the symptoms of hypoglycemia and treat it right away. Always have a 15-gram rapid-acting carbohydrate snack with you to treat low blood glucose. Family members and close friends should also know the symptoms and understand how to treat hypoglycemia, in case you are not able to treat yourself. Symptoms may include: Hunger. Anxiety. Sweating and feeling clammy. Dizziness or feeling light-headed. Sleepiness. Increased heart rate. Irritability. Tingling or numbness around the mouth, lips, or tongue. Restless sleep. Severe hypoglycemia is when your blood glucose level is at or below 54 mg/dL (3 mmol/L). Severe hypoglycemia is an emergency. Do not wait to see if the symptoms will go away. Get medical help right away. Call your local emergency services (911 in the [place] Do not drive yourself to the hospital. If you have severe hypoglycemia and you cannot eat or drink, you may need glucagon. A family member or close friend should learn how to check your blood glucose and how to give you glucagon. Ask your health care provider if you need to have an emergency glucagon kit available. Follow these instructions at home: Medicines Take prescribed insulin or diabetes medicines as told by your health care provider. Do not run out of insulin or other diabetes medicines. Plan ahead so you always have these available. If you use insulin, adjust your dosage based on your physical activity and what foods you eat. Your health care provider will tell you how to adjust your dosage. Take  over-the-counter and prescription medicines only as told by your health care provider. Eating and drinking  What you eat and drink affects your blood glucose and your insulin dosage. Making good choices helps to control your diabetes and prevent other health problems. A healthy meal plan includes eating lean proteins, complex carbohydrates, fresh fruits and vegetables, low-fat dairy products, and healthy fats. Make an appointment to see a registered dietitian to help you create an eating plan that is right for you. Make sure that you: Follow instructions from your health care provider about eating or drinking restrictions. Drink enough fluid to keep your urine pale yellow. Keep a record of the carbohydrates that you eat. Do this by reading food labels and learning the standard serving sizes of foods. Follow your sick-day plan whenever you cannot eat or drink as usual. Make this plan in advance with your health care provider.  Activity Stay active. Exercise regularly, as told by your health care provider. This may include: Stretching and doing strength exercises, such as yoga or weight lifting, two or more times a week. Doing 150 minutes or more of moderate-intensity or vigorous-intensity exercise each week. This could be brisk walking, biking, or water aerobics. Spread out your activity over 3 or more days of the week. Do not go more than 2 days in a row  without doing some kind of physical activity. When you start a new exercise or activity, work with your health care provider to adjust your insulin, medicines, or food intake as needed. Lifestyle Do not use any products that contain nicotine or tobacco. These products include cigarettes, chewing tobacco, and vaping devices, such as e-cigarettes. If you need help quitting, ask your health care provider. If you drink alcohol and your health care provider says that it is safe for you: Limit how much you have to: 0-1 drink a day for women who are  not pregnant. 0-2 drinks a day for men. Know how much alcohol is in your drink. In the [place] one drink equals one 12 oz bottle of beer (355 mL), one 5 oz glass of wine (148 mL), or one 1 oz glass of hard liquor (44 mL). Learn to manage stress. If you need help with this, ask your health care provider. Take care of your body  Keep your immunizations up to date. In addition to getting vaccinations as told by your health care provider, it is recommended that you get vaccinated against the following illnesses: The flu (influenza). Get a flu shot every year. Pneumonia. Hepatitis B. Schedule an eye exam soon after your diagnosis, and then one time every year after that. Check your skin and feet every day for cuts, bruises, redness, blisters, or sores. Schedule a foot exam with your health care provider once every year. Brush your teeth and gums two times a day, and floss one or more times a day. Visit your dentist one or more times every 6 months. Maintain a healthy weight. General instructions Share your diabetes management plan with people in your workplace, school, and household. Carry a medical alert card or wear medical alert jewelry. Keep all follow-up visits. This is important. Questions to ask your health care provider Should I meet with a certified diabetes care and education specialist? Where can I find a support group for people with diabetes? Where to find more information For help and guidance and for more information about diabetes, please visit: American Diabetes Association (ADA): www.diabetes.org American Association of Diabetes Care and Education Specialists (ADCES): www.diabeteseducator.org International Diabetes Federation (IDF): DCOnly.dk Summary Caring for yourself after you have been diagnosed with type 2 diabetes (type 2 diabetes mellitus) means keeping your blood sugar (glucose) under control with a balance of nutrition, exercise, lifestyle changes, and  medicine. Check your blood glucose every day, as often as told by your health care provider. Having diabetes can put you at risk for other long-term (chronic) conditions, such as heart disease and kidney disease. Your health care provider may prescribe medicines to help prevent complications from diabetes. Share your diabetes management plan with people in your workplace, school, and household. Keep all follow-up visits. This is important. This information is not intended to replace advice given to you by your health care provider. Make sure you discuss any questions you have with your health care provider. Document Revised: 08/16/2020 Document Reviewed: 08/16/2020 Elsevier Patient Education  2024 [employer].

## 2023-01-29 NOTE — Progress Notes (Signed)
Subjective:  Patient ID: Kevin Jarvis, male    DOB: [birth year]  Age: 57 y.o. MRN: [id number]  CC:  Chief Complaint  Patient presents with   Medical Management of Chronic Issues    Pt is doing well,     HPI Kevin Jarvis presents for   Follow-up of chronic conditions, last visit with me in September 2023.  Physical at that time. No other medical eval. Denies barriers to care.  New baby girl expected in January. 7yo and 4yo sons.    Diabetes: New onset in July 2022.  Complicated by microalbuminuria. Treated with metformin total dose 1000 mg every morning. Home readings fasting: 115-120 Postprandial: none No symptomatic lows He is on ARB Microalbumin: 2.5 with ratio of 1.2 on 12/24/2021. Optho, foot exam, pneumovax:  Ophthalmology exam - scheduling in next few months.      Lab Results  Component Value Date   HGBA1C 6.0 12/24/2021   HGBA1C 6.4 (A) 08/03/2021   HGBA1C 6.1 (A) 01/12/2021   Lab Results  Component Value Date   MICROALBUR 2.5 (H) 12/24/2021   LDLCALC 71 12/24/2021   CREATININE 1.10 12/24/2021   Hypertension: Has been followed by advanced hypertension clinic for hypertension.  Previously valsartan discontinued, started amlodipine valsartan HCTZ 10/325/25 milligrams daily and hydralazine 50 mg 3 times daily.  Last visit with me in September 2023, no recent cardiology eval. Some missed doses at times - last took meds yesterday. Misses few days per week.  Taking hydralazine BID.   No med side effects.  Trouble getting to sleep, waking some during the night. No PND or orthopnea. Tried otc sleep aid - z quil. Some help. No snoring.  Home readings: 130-140 range.  BP Readings from Last 3 Encounters:  01/29/23 (!) 144/80  12/24/21 (!) 140/82  11/27/21 112/64   Lab Results  Component Value Date   CREATININE 1.10 12/24/2021   Erectile dysfunction Treated with sildenafil previously.   Not used/needed recently.   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 Allergies  Allergen Reactions   Chlorthalidone Other (See Comments)    lightheaded   Prior to Admission medications   Medication Sig Start Date End Date Taking? Authorizing Provider  ACCU-CHEK GUIDE test strip  10/10/20  Yes [provider]  Accu-Chek Softclix Lancets lancets 2 (two) times daily. 10/10/20  Yes [provider]  blood glucose meter kit and supplies Dispense based on patient and insurance preference. Use 2 times daily - fasting or 2 hours after meals. 10/06/20  Yes Shade Flood, MD  hydrALAZINE (APRESOLINE) 50 MG tablet TAKE 1 TABLET BY MOUTH THREE TIMES DAILY NEED  APPOINTMENT 01/20/23  Yes Alver Sorrow, NP  Insulin Pen Needle (PEN NEEDLES) 32G X 4 MM MISC Use with solostar pen daily.  10/06/20  Yes Shade Flood, MD  metFORMIN (GLUCOPHAGE-XR) 500 MG 24 hr tablet TAKE 2 TABLETS BY MOUTH ONCE DAILY WITH BREAKFAST 10/28/22  Yes Shade Flood, MD  Olmesartan-amLODIPine-HCTZ 40-10-25 MG TABS Take 1 tablet by mouth daily. 01/14/23  Yes Shade Flood, MD   Social History   Socioeconomic History   Marital status: Married    Spouse name: Not on file   Number of children: 2   Years of education: Not on file   Highest education level: Not on file  Occupational History   Occupation:  Location manager  Tobacco Use   Smoking status: Never   Smokeless tobacco: Never  Vaping Use   Vaping status: Never Used  Substance and Sexual Activity   Alcohol use: Yes    Comment: Social   Drug use: No   Sexual activity: Yes  Other Topics Concern   Not on file  Social History Narrative   From [place] - Moved to [place] 2000   Lives with wife and son   Social Determinants of Health   Financial Resource Strain: Not on file  Food Insecurity: Not on file  Transportation Needs: Not on file  Physical Activity: Not on file  Stress: Not on file  Social Connections: Not on file  Intimate Partner Violence: Not on file    Review of Systems  Constitutional:  Negative for fatigue and unexpected weight change.  Eyes:  Negative for visual disturbance.  Respiratory:  Negative for cough, chest tightness and shortness of breath.   Cardiovascular:  Negative for chest pain, palpitations and leg swelling.  Gastrointestinal:  Negative for abdominal pain and blood in stool.  Neurological:  Negative for dizziness, light-headedness and headaches.     Objective:   Vitals:   01/29/23 1110  BP: (!) 144/80  Pulse: 77  Temp: 98.3 F (36.8 C)  TempSrc: Temporal  SpO2: 99%  Weight: 214 lb 6.4 oz (97.3 kg)  Height: 5\' 9"  (1.753 m)     Physical Exam Vitals reviewed.  Constitutional:      Appearance: He is well-developed.  HENT:     Head: Normocephalic and atraumatic.  Neck:     Vascular: No carotid bruit or JVD.  Cardiovascular:     Rate and Rhythm: Normal rate and regular rhythm.     Heart sounds: Normal heart sounds. No murmur heard. Pulmonary:     Effort: Pulmonary effort is normal.     Breath sounds: Normal breath sounds. No rales.  Musculoskeletal:     Right lower leg: No edema.     Left lower leg: No edema.  Skin:    General: Skin is warm and dry.  Neurological:     Mental Status: He is alert and oriented to person, place, and time.  Psychiatric:        Mood and Affect:  Mood normal.        Assessment & Plan:  Kevin Jarvis is a 57 y.o. male . Type 2 diabetes mellitus with hyperglycemia, with long-term current use of insulin (HCC) - Plan: Urine Microalbumin w/creat. ratio, Comprehensive metabolic panel, Hemoglobin A1c, metFORMIN (GLUCOPHAGE-XR) 500 MG 24 hr tablet  Need for influenza vaccination - Plan: Flu vaccine trivalent PF, 6mos and older(Flulaval,Afluria,Fluarix,Fluzone)  Hypertriglyceridemia - Plan: Lipid panel  Essential hypertension - Plan: hydrALAZINE (APRESOLINE) 50 MG tablet, Olmesartan-amLODIPine-HCTZ 40-10-25 MG TABS  Insomnia, unspecified type  Delay in follow-up from last visit.  Standards of care, recommendations discussed for ongoing monitoring and need for monitoring of  prescription medications as well as labs.  Understanding expressed.  Denies any barriers to care.  Some intermittent medication nonadherence as above.  Recommended pill minder or alarm on phone to assist. Continue same med regimen with the combination med as well as hydralazine for hypertension, recheck 1 month.  Continue metformin for diabetes and adjust plan depending on A1c.  Again follow-up in 1 month.  Notes intermittent insomnia, mixed.  Onset and awakening.  Denies snoring, less likely sleep apnea but consider sleep study if persistent.  Melatonin as option initially as well as sleep hygiene given on handout, recheck 1 month.  Meds ordered this encounter  Medications   hydrALAZINE (APRESOLINE) 50 MG tablet    Sig: Take 1 tablet (50 mg total) by mouth 3 (three) times daily.    Dispense:  270 tablet    Refill:  1   metFORMIN (GLUCOPHAGE-XR) 500 MG 24 hr tablet    Sig: Take 2 tablets (1,000 mg total) by mouth daily with breakfast.    Dispense:  180 tablet    Refill:  1   Olmesartan-amLODIPine-HCTZ 40-10-25 MG TABS    Sig: Take 1 tablet by mouth daily.    Dispense:  90 tablet    Refill:  0   Patient Instructions  Thanks for coming in today.  Follow up  with eye doctor as planned.  Try using pill dispenser to help with remembering medication as well as an alarm on your phone can be helpful.  Recheck with me in 1 month.  Sooner if any new or worsening symptoms. See information below in sleep.  Melatonin is fine over-the-counter temporarily.  Will discuss this further at your follow-up visit.  If any snoring, or you have continued nighttime wakening we may need to have you see a sleep specialist to make sure you do not have sleep apnea.  Let me know if that occurs.  Take care!  Insomnia Insomnia is a sleep disorder that makes it difficult to fall asleep or stay asleep. Insomnia can cause fatigue, low energy, difficulty concentrating, mood swings, and poor performance at work or school. There are three different ways to classify insomnia: Difficulty falling asleep. Difficulty staying asleep. Waking up too early in the morning. Any type of insomnia can be long-term (chronic) or short-term (acute). Both are common. Short-term insomnia usually lasts for 3 months or less. Chronic insomnia occurs at least three times a week for longer than 3 months. What are the causes? Insomnia may be caused by another condition, situation, or substance, such as: Having certain mental health conditions, such as anxiety and depression. Using caffeine, alcohol, tobacco, or drugs. Having gastrointestinal conditions, such as gastroesophageal reflux disease (GERD). Having certain medical conditions. These include: Asthma. Alzheimer's disease. Stroke. Chronic pain. An overactive thyroid gland (hyperthyroidism). Other sleep disorders, such as restless legs syndrome and sleep apnea. Menopause. Sometimes, the cause of insomnia may not be known. What increases the risk? Risk factors for insomnia include: Gender. Females are affected more often than males. Age. Insomnia is more common as people get older. Stress and certain medical and mental health conditions. Lack  of exercise. Having an irregular work schedule. This may include working night shifts and traveling between different time zones. What are the signs or symptoms? If you have insomnia, the main symptom is having trouble falling asleep or having trouble staying asleep. This may lead to other symptoms, such as: Feeling tired or having low energy. Feeling nervous about going to sleep. Not feeling rested in  the morning. Having trouble concentrating. Feeling irritable, anxious, or depressed. How is this diagnosed? This condition may be diagnosed based on: Your symptoms and medical history. Your health care provider may ask about: Your sleep habits. Any medical conditions you have. Your mental health. A physical exam. How is this treated? Treatment for insomnia depends on the cause. Treatment may focus on treating an underlying condition that is causing the insomnia. Treatment may also include: Medicines to help you sleep. Counseling or therapy. Lifestyle adjustments to help you sleep better. Follow these instructions at home: Eating and drinking  Limit or avoid alcohol, caffeinated beverages, and products that contain nicotine and tobacco, especially close to bedtime. These can disrupt your sleep. Do not eat a large meal or eat spicy foods right before bedtime. This can lead to digestive discomfort that can make it hard for you to sleep. Sleep habits  Keep a sleep diary to help you and your health care provider figure out what could be causing your insomnia. Write down: When you sleep. When you wake up during the night. How well you sleep and how rested you feel the next day. Any side effects of medicines you are taking. What you eat and drink. Make your bedroom a dark, comfortable place where it is easy to fall asleep. Put up shades or blackout curtains to block light from outside. Use a white noise machine to block noise. Keep the temperature cool. Limit screen use before bedtime.  This includes: Not watching TV. Not using your smartphone, tablet, or computer. Stick to a routine that includes going to bed and waking up at the same times every day and night. This can help you fall asleep faster. Consider making a quiet activity, such as reading, part of your nighttime routine. Try to avoid taking naps during the day so that you sleep better at night. Get out of bed if you are still awake after 15 minutes of trying to sleep. Keep the lights down, but try reading or doing a quiet activity. When you feel sleepy, go back to bed. General instructions Take over-the-counter and prescription medicines only as told by your health care provider. Exercise regularly as told by your health care provider. However, avoid exercising in the hours right before bedtime. Use relaxation techniques to manage stress. Ask your health care provider to suggest some techniques that may work well for you. These may include: Breathing exercises. Routines to release muscle tension. Visualizing peaceful scenes. Make sure that you drive carefully. Do not drive if you feel very sleepy. Keep all follow-up visits. This is important. Contact a health care provider if: You are tired throughout the day. You have trouble in your daily routine due to sleepiness. You continue to have sleep problems, or your sleep problems get worse. Get help right away if: You have thoughts about hurting yourself or someone else. Get help right away if you feel like you may hurt yourself or others, or have thoughts about taking your own life. Go to your nearest emergency room or: Call 911. Call the National Suicide Prevention Lifeline at (775)243-2047 or 988. This is open 24 hours a day. Text the Crisis Text Line at 769 584 5816. Summary Insomnia is a sleep disorder that makes it difficult to fall asleep or stay asleep. Insomnia can be long-term (chronic) or short-term (acute). Treatment for insomnia depends on the cause.  Treatment may focus on treating an underlying condition that is causing the insomnia. Keep a sleep diary to help you and your  health care provider figure out what could be causing your insomnia. This information is not intended to replace advice given to you by your health care provider. Make sure you discuss any questions you have with your health care provider. Document Revised: 02/26/2021 Document Reviewed: 02/26/2021 Elsevier Patient Education  2024 Elsevier Inc.   Type 2 Diabetes Mellitus, Self-Care, Adult Caring for yourself after you have been diagnosed with type 2 diabetes (type 2 diabetes mellitus) means keeping your blood sugar (glucose) under control with a balance of: Nutrition. Exercise. Lifestyle changes. Medicines or insulin, if needed. Support from your team of health care providers and others. What are the risks? Having type 2 diabetes can put you at risk for other long-term (chronic) conditions, such as heart disease and kidney disease. Your health care provider may prescribe medicines to help prevent complications from diabetes. How to monitor your blood glucose  Check your blood glucose every day or as often as told by your health care provider. Have your A1C (hemoglobin A1C) level checked two or more times a year, or as often as told by your health care provider. Your health care provider will set personalized treatment goals for you. Generally, the goal of treatment is to maintain the following blood glucose levels: Before meals: 80-130 mg/dL (4.0-9.8 mmol/L). After meals: below 180 mg/dL (10 mmol/L). A1C level: less than 7%. How to manage hyperglycemia and hypoglycemia Hyperglycemia symptoms Hyperglycemia, also called high blood glucose, occurs when blood glucose is too high. Make sure you know the early signs of hyperglycemia, such as: Increased thirst. Hunger. Feeling very tired. Needing to urinate more often than usual. Blurry vision. Hypoglycemia  symptoms Hypoglycemia, also called low blood glucose, occurs with a blood glucose level at or below 70 mg/dL (3.9 mmol/L). Diabetes medicines lower your blood glucose and can cause hypoglycemia. The risk for hypoglycemia increases during or after exercise, during sleep, during illness, and when skipping meals or not eating for a long time (fasting). It is important to know the symptoms of hypoglycemia and treat it right away. Always have a 15-gram rapid-acting carbohydrate snack with you to treat low blood glucose. Family members and close friends should also know the symptoms and understand how to treat hypoglycemia, in case you are not able to treat yourself. Symptoms may include: Hunger. Anxiety. Sweating and feeling clammy. Dizziness or feeling light-headed. Sleepiness. Increased heart rate. Irritability. Tingling or numbness around the mouth, lips, or tongue. Restless sleep. Severe hypoglycemia is when your blood glucose level is at or below 54 mg/dL (3 mmol/L). Severe hypoglycemia is an emergency. Do not wait to see if the symptoms will go away. Get medical help right away. Call your local emergency services (911 in the [place] Do not drive yourself to the hospital. If you have severe hypoglycemia and you cannot eat or drink, you may need glucagon. A family member or close friend should learn how to check your blood glucose and how to give you glucagon. Ask your health care provider if you need to have an emergency glucagon kit available. Follow these instructions at home: Medicines Take prescribed insulin or diabetes medicines as told by your health care provider. Do not run out of insulin or other diabetes medicines. Plan ahead so you always have these available. If you use insulin, adjust your dosage based on your physical activity and what foods you eat. Your health care provider will tell you how to adjust your dosage. Take over-the-counter and prescription medicines only as told by  your  health care provider. Eating and drinking  What you eat and drink affects your blood glucose and your insulin dosage. Making good choices helps to control your diabetes and prevent other health problems. A healthy meal plan includes eating lean proteins, complex carbohydrates, fresh fruits and vegetables, low-fat dairy products, and healthy fats. Make an appointment to see a registered dietitian to help you create an eating plan that is right for you. Make sure that you: Follow instructions from your health care provider about eating or drinking restrictions. Drink enough fluid to keep your urine pale yellow. Keep a record of the carbohydrates that you eat. Do this by reading food labels and learning the standard serving sizes of foods. Follow your sick-day plan whenever you cannot eat or drink as usual. Make this plan in advance with your health care provider.  Activity Stay active. Exercise regularly, as told by your health care provider. This may include: Stretching and doing strength exercises, such as yoga or weight lifting, two or more times a week. Doing 150 minutes or more of moderate-intensity or vigorous-intensity exercise each week. This could be brisk walking, biking, or water aerobics. Spread out your activity over 3 or more days of the week. Do not go more than 2 days in a row without doing some kind of physical activity. When you start a new exercise or activity, work with your health care provider to adjust your insulin, medicines, or food intake as needed. Lifestyle Do not use any products that contain nicotine or tobacco. These products include cigarettes, chewing tobacco, and vaping devices, such as e-cigarettes. If you need help quitting, ask your health care provider. If you drink alcohol and your health care provider says that it is safe for you: Limit how much you have to: 0-1 drink a day for women who are not pregnant. 0-2 drinks a day for men. Know how much  alcohol is in your drink. In the [place] one drink equals one 12 oz bottle of beer (355 mL), one 5 oz glass of wine (148 mL), or one 1 oz glass of hard liquor (44 mL). Learn to manage stress. If you need help with this, ask your health care provider. Take care of your body  Keep your immunizations up to date. In addition to getting vaccinations as told by your health care provider, it is recommended that you get vaccinated against the following illnesses: The flu (influenza). Get a flu shot every year. Pneumonia. Hepatitis B. Schedule an eye exam soon after your diagnosis, and then one time every year after that. Check your skin and feet every day for cuts, bruises, redness, blisters, or sores. Schedule a foot exam with your health care provider once every year. Brush your teeth and gums two times a day, and floss one or more times a day. Visit your dentist one or more times every 6 months. Maintain a healthy weight. General instructions Share your diabetes management plan with people in your workplace, school, and household. Carry a medical alert card or wear medical alert jewelry. Keep all follow-up visits. This is important. Questions to ask your health care provider Should I meet with a certified diabetes care and education specialist? Where can I find a support group for people with diabetes? Where to find more information For help and guidance and for more information about diabetes, please visit: American Diabetes Association (ADA): www.diabetes.org American Association of Diabetes Care and Education Specialists (ADCES): www.diabeteseducator.org International Diabetes Federation (IDF): DCOnly.dk Summary Caring for yourself after  you have been diagnosed with type 2 diabetes (type 2 diabetes mellitus) means keeping your blood sugar (glucose) under control with a balance of nutrition, exercise, lifestyle changes, and medicine. Check your blood glucose every day, as often as told by  your health care provider. Having diabetes can put you at risk for other long-term (chronic) conditions, such as heart disease and kidney disease. Your health care provider may prescribe medicines to help prevent complications from diabetes. Share your diabetes management plan with people in your workplace, school, and household. Keep all follow-up visits. This is important. This information is not intended to replace advice given to you by your health care provider. Make sure you discuss any questions you have with your health care provider. Document Revised: 08/16/2020 Document Reviewed: 08/16/2020 Elsevier Patient Education  2024 Elsevier Inc.     Signed,   Meredith Staggers, MD [facility], [facility] 01/29/23 11:36 AM

## 2023-02-06 ENCOUNTER — Telehealth: Payer: Self-pay

## 2023-02-06 NOTE — Telephone Encounter (Signed)
-----   Message from Shade Flood sent at 02/06/2023 11:14 AM EST -----  Call patient.  Urine test for protein was similar to previous readings.  Continued work on diabetes can help.  Cholesterol levels were normal.  Electrolytes overall looked okay except blood sugar just a few points up at 117.  However 74-month blood sugar test was stable at 6.3.  No medication changes for now.  Let me know if there are questions.

## 2023-02-07 NOTE — Telephone Encounter (Signed)
 Called patient to discuss lab work, no answer, left a message for the patient to call back to discuss

## 2023-02-10 NOTE — Telephone Encounter (Signed)
 Called patient to discuss lab work, no answer, left a message for the patient to call back to discuss

## 2023-02-11 NOTE — Telephone Encounter (Signed)
Letter out

## 2023-03-07 ENCOUNTER — Ambulatory Visit: Payer: BC Managed Care – PPO | Admitting: Family Medicine

## 2023-03-21 LAB — HM DIABETES EYE EXAM

## 2023-03-27 ENCOUNTER — Ambulatory Visit: Payer: BC Managed Care – PPO | Admitting: Family Medicine

## 2023-03-27 ENCOUNTER — Encounter: Payer: Self-pay | Admitting: Family Medicine

## 2023-03-27 VITALS — BP 142/74 | HR 80 | Temp 98.6°F | Ht 69.0 in | Wt 216.4 lb

## 2023-03-27 DIAGNOSIS — Z794 Long term (current) use of insulin: Secondary | ICD-10-CM

## 2023-03-27 DIAGNOSIS — E1165 Type 2 diabetes mellitus with hyperglycemia: Secondary | ICD-10-CM

## 2023-03-27 DIAGNOSIS — I10 Essential (primary) hypertension: Secondary | ICD-10-CM

## 2023-03-27 NOTE — Patient Instructions (Addendum)
Try reminder on phone and pill minder (pill box) to help remember to take meds.  If you are having difficulty with the hydralazine dosing we can look at something that will be once per day.  Let me know.  Follow-up in 6 weeks and you can bring your blood pressure meter with you at that time to see if it is giving you correct readings.  Please let me know if there are any questions in the meantime and take care.

## 2023-03-27 NOTE — Progress Notes (Signed)
Subjective:  Patient ID: Kevin Jarvis, male    DOB: 05-09-1965  Age: 57 y.o. MRN: [id number]  CC:  Chief Complaint  Patient presents with   Medical Management of Chronic Issues    Pt is here today for F/U on Sleep , B/P na d labs Pt reports he is not working 2 jobs currently and he is sleeping better Pt reports he is FASTING Pt reports he has no other concerns     HPI Kevin Jarvis presents for   Diabetes: New onset July 2022 complicated by microalbuminuria.  Overall stable A1c at last visit.  Some medication adherence issues previously.  Reminders including alarm on phone or pill minder recommended at his October visit.  Continued on metformin.  He is on ARB. No change in reminders on phone or pill box - no change since last visit. Working 2 jobs - busy.  Home readings: 110-140 No lows.   Microalbumin: 2.5 with ratio of 1.2 on 12/24/2021, 4.6 with ratio of 2.3 on 01/29/2023. Optho, foot exam, pneumovax: Planned on scheduling Optho visit soon at his last visit. Recent visit  - told all was ok - requested copy.   Lab Results  Component Value Date   HGBA1C 6.3 01/29/2023   HGBA1C 6.0 12/24/2021   HGBA1C 6.4 (A) 08/03/2021   Lab Results  Component Value Date   MICROALBUR 4.6 (H) 01/29/2023   LDLCALC 69 01/29/2023   CREATININE 1.13 01/29/2023   Hypertension: Treated with hydralazine, olmesartan/amlodipine/hct.  As above some medication adherence concerns previously. As above, no changes in plan.  Off meds for 3 days  with the holiday, then took today.  Home readings: rare  readings. High at home at times.  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 No pertinent surgical history. Allergies  Allergen Reactions   Chlorthalidone Other (See Comments)    lightheaded   Prior to Admission medications   Medication Sig Start Date End Date Taking? Authorizing Provider  ACCU-CHEK GUIDE test strip  10/10/20  Yes [provider]  Accu-Chek Softclix Lancets lancets 2 (two) times daily. 10/10/20  Yes [provider]  blood glucose meter kit and supplies Dispense based on patient and insurance preference. Use 2 times daily - fasting or 2 hours after meals. 10/06/20  Yes Shade Flood, MD  hydrALAZINE (APRESOLINE) 50 MG tablet Take 1 tablet (50 mg total) by mouth 3 (three) times daily. 01/29/23  Yes Shade Flood, MD  metFORMIN (GLUCOPHAGE-XR) 500 MG 24 hr tablet Take 2 tablets (1,000 mg total) by mouth daily with breakfast. 01/29/23  Yes Shade Flood, MD  Olmesartan-amLODIPine-HCTZ 40-10-25 MG TABS Take 1 tablet by mouth daily. 01/29/23  Yes Shade Flood, MD   Social History   Socioeconomic History   Marital status: Married    Spouse name: Not on file   Number of children: 2   Years of education: Not on file   Highest education level: Not on file  Occupational History   Occupation: Location manager  Tobacco Use   Smoking status: Never   Smokeless tobacco: Never  Vaping Use   Vaping status: Never Used  Substance and Sexual Activity   Alcohol use: Yes    Comment: Social   Drug use: No   Sexual activity: Yes   Other Topics Concern   Not on file  Social History Narrative   From [place] - Moved to [place] 2000   Lives with wife and son   Social Drivers of Corporate investment banker Strain: Not on file  Food Insecurity: Not on file  Transportation Needs: Not on file  Physical Activity: Not on file  Stress: Not on file  Social Connections: Not on file  Intimate Partner Violence: Not on file    Review of Systems  Constitutional:  Negative for fatigue and unexpected weight change.  Eyes:  Negative for visual disturbance.  Respiratory:  Negative for cough, chest tightness and shortness of breath.   Cardiovascular:  Negative for chest pain, palpitations and leg swelling.  Gastrointestinal:  Negative for abdominal pain and blood in stool.  Neurological:  Negative for dizziness, light-headedness and headaches.     Objective:   Vitals:   03/27/23 1318  BP: (!) 142/74  Pulse: 80  Temp: 98.6 F (37 C)  SpO2: 100%  Weight: 216 lb 6 oz (98.1 kg)  Height: 5\' 9"  (1.753 m)     Physical Exam Vitals reviewed.  Constitutional:      Appearance: He is well-developed.  HENT:     Head: Normocephalic and atraumatic.  Neck:     Vascular: No carotid bruit or JVD.  Cardiovascular:     Rate and Rhythm: Normal rate and regular rhythm.     Heart sounds: Normal heart sounds. No murmur heard. Pulmonary:     Effort: Pulmonary effort is normal.     Breath sounds: Normal breath sounds. No rales.  Musculoskeletal:     Right lower leg: No edema.     Left lower leg: No edema.  Skin:    General: Skin is warm and dry.  Neurological:     Mental Status: He is alert and oriented to person, place, and time.  Psychiatric:        Mood and Affect: Mood normal.        Assessment & Plan:  Kevin Jarvis is a 57 y.o. male . Essential hypertension  Type 2 diabetes mellitus with hyperglycemia, with long-term current use of insulin (HCC) Diabetes under sufficient control on last labs.  Blood pressure  uncontrolled with medication nonadherence.  Off meds for few days and then took this morning.  Based on this information and in office blood pressure reading we will hold on new medication for now.   We discussed potential risks of uncontrolled hypertension including but not limited to stroke, chronic kidney disease, cardiovascular disease.  Discussed methods to help remember taking medications including alarm and pill minder which he plans to institute.  If he is having difficulty with hydralazine dosing, can certainly change to a different agent but he would like to try to stay on that one  for now.  Recheck in 6 weeks with home meter.  No orders of the defined types were placed in this encounter.  Patient Instructions  Try reminder on phone and pill minder (pill box) to help remember to take meds.  If you are having difficulty with the hydralazine dosing we can look at something that will be once per day.  Let me know.  Follow-up in 6 weeks and you can bring your blood pressure meter with you at that time to see if it is giving you correct readings.  Please let me know if there are any questions in the meantime and take care.     Signed,   Meredith Staggers, MD [facility], [facility] 03/27/23 1:53 PM

## 2023-04-13 IMAGING — CT CT HEAD W/O CM
4 series · 16 of 47 positions shown, 18 images · non-contrast
Comparison: None Available.

CLINICAL DATA: Headache for 5 days. Episodes of blurred vision. No
relief with over-the-counter Tylenol.



[Series 2: head wo · axial · 0.44mm/px · z∈[-97,+23]mm · 7 of 34 slices shown, 9 images]
[im 5/34  brain]
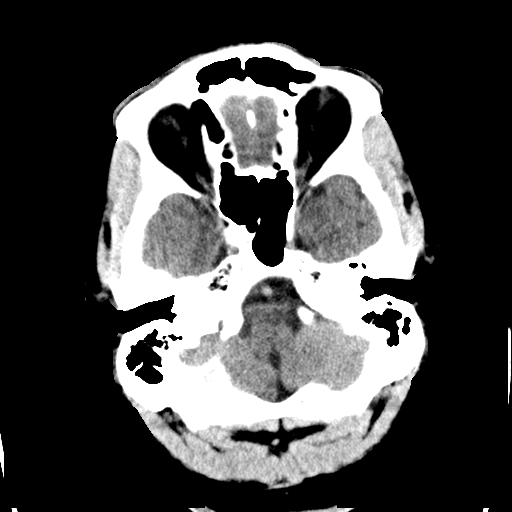
[im 5/34  bone]
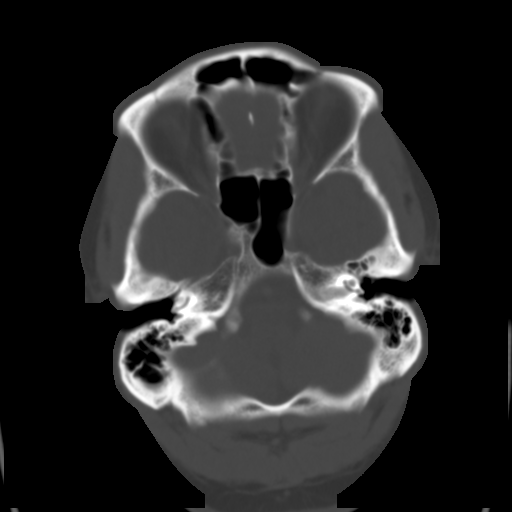
[im 9/34  brain]
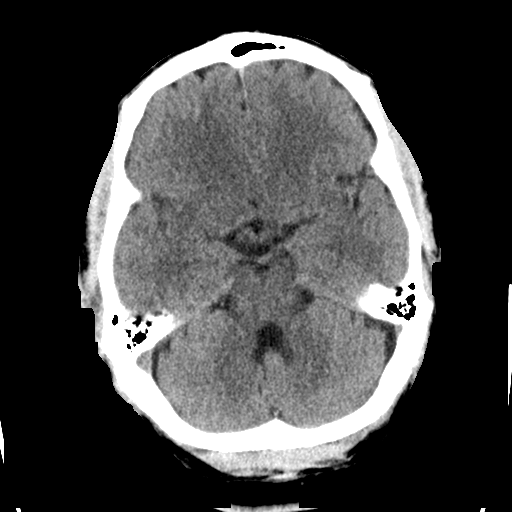
[im 13/34  brain]
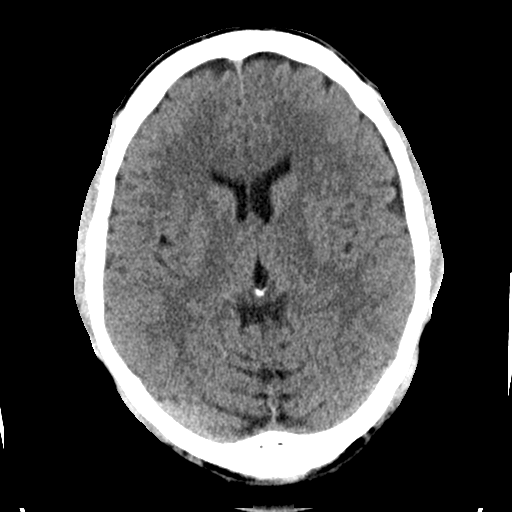
[im 17/34  brain]
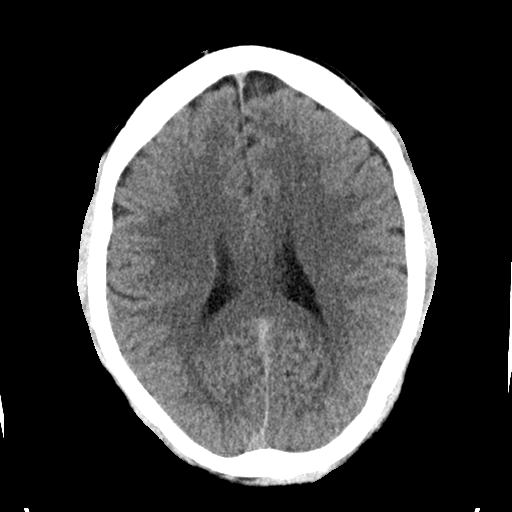
[im 21/34  brain]
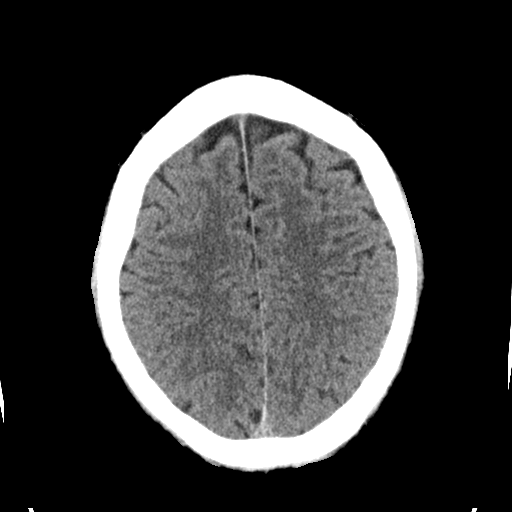
[im 21/34  bone]
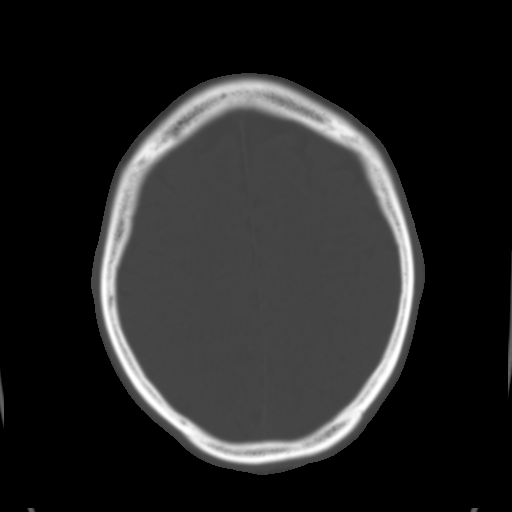
[im 25/34  brain]
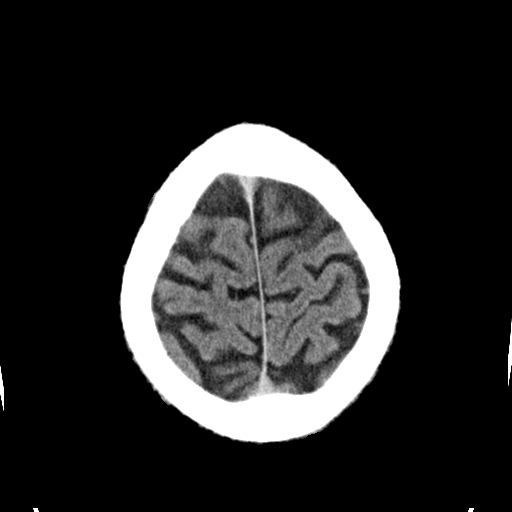
[im 29/34  brain]
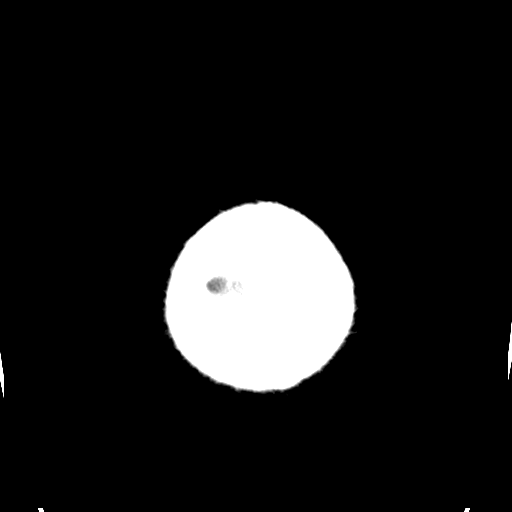

[Series 3: head bone · axial · 0.44mm/px · z∈[-101,-69]mm · 3 of 84 slices shown]
[im 9/84  bone]
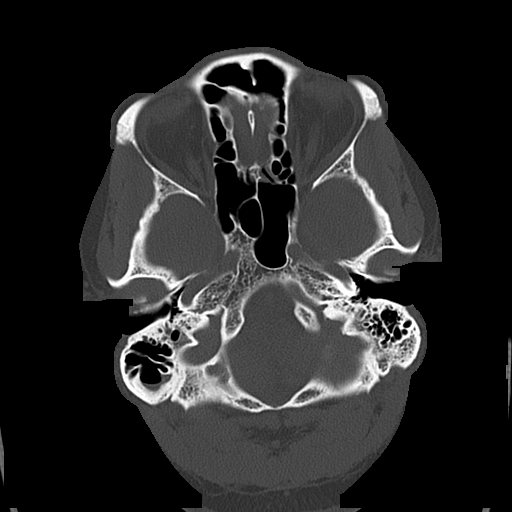
[im 17/84  bone]
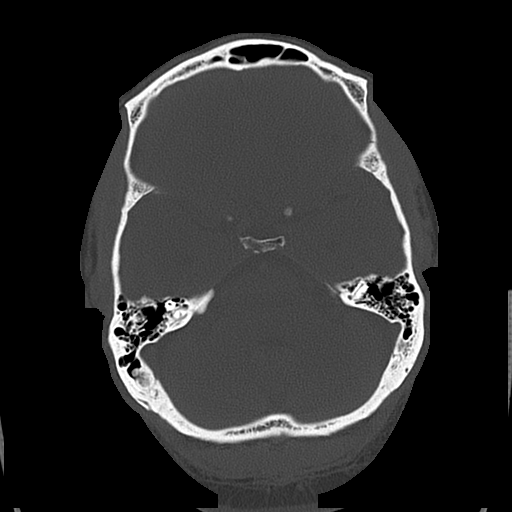
[im 25/84  bone]
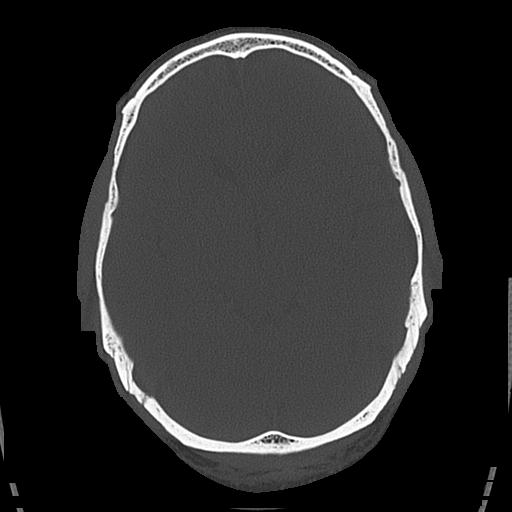

[Series 4: coronal soft · coronal · 0.35mm/px · 3 of 73 slices shown]
[im 25/73  brain]
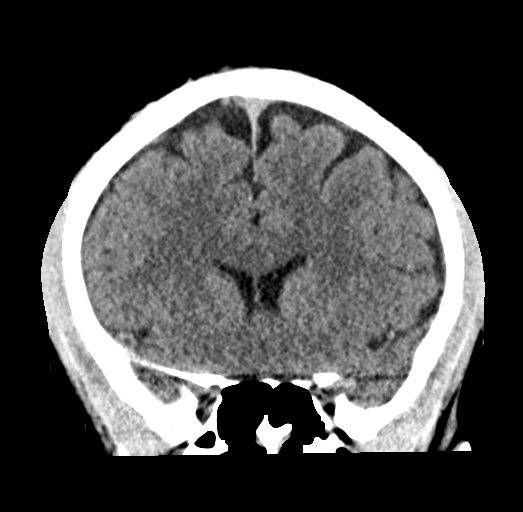
[im 33/73  brain]
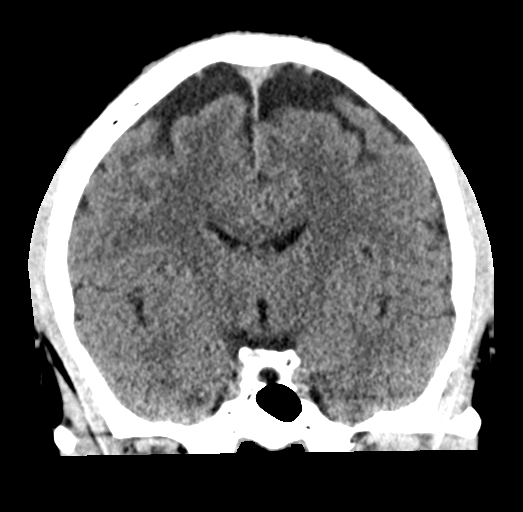
[im 41/73  brain]
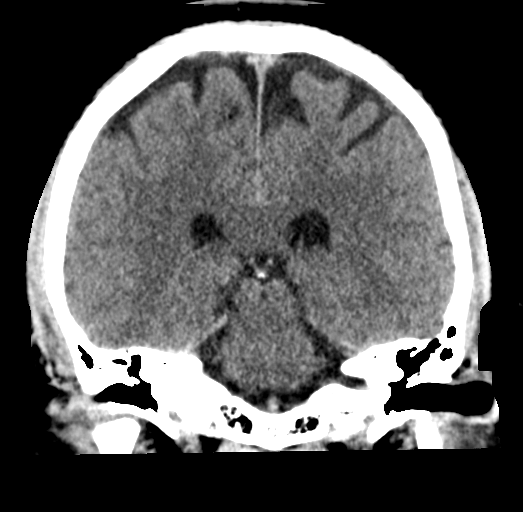

[Series 5: sagittal soft · sagittal · 0.35mm/px · 3 of 62 slices shown]
[im 21/62  brain]
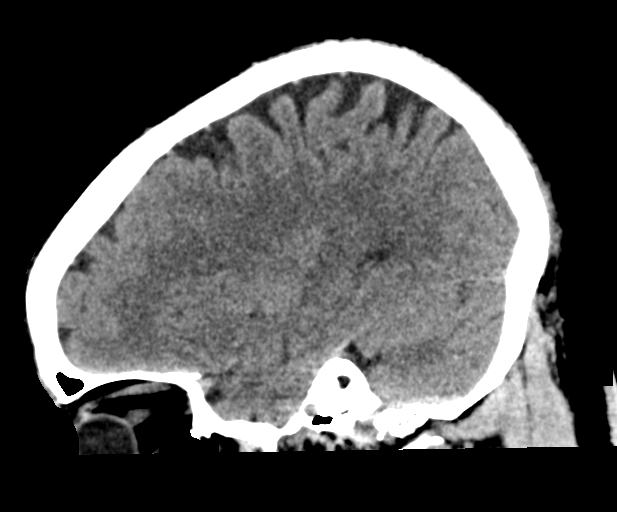
[im 31/62  brain]
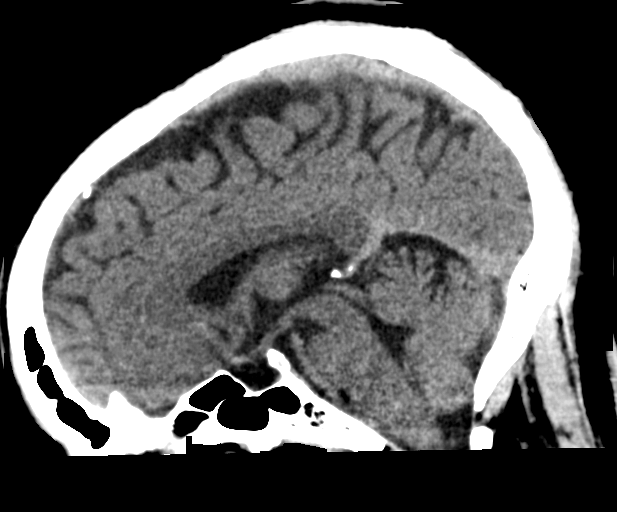
[im 41/62  brain]
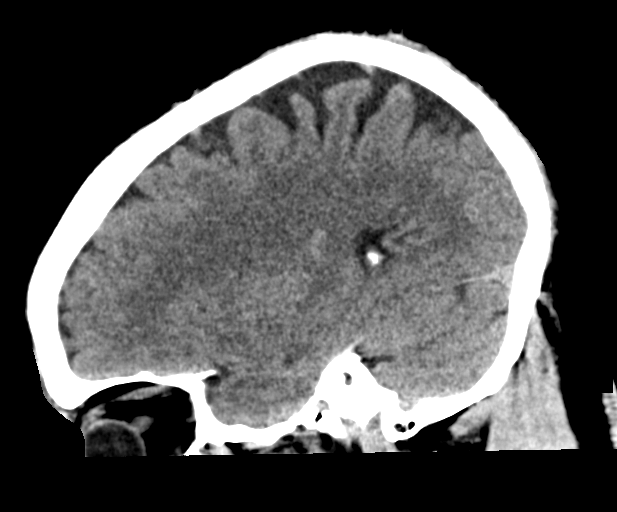

[16 of 47 positions shown; findings below may reference images not displayed]

FINDINGS: Brain: No acute infarct, hemorrhage, or mass lesion is present. No
significant white matter lesions are present. The ventricles are of
normal size. No significant extraaxial fluid collection is present.

The brainstem and cerebellum are within normal limits.

Vascular: Flow is present in the major intracranial arteries.

Skull: Calvarium is intact. No focal lytic or blastic lesions are
present. No significant extracranial soft tissue lesion is present.

Sinuses/Orbits: The paranasal sinuses and mastoid air cells are
clear. The globes and orbits are within normal limits.
IMPRESSION: Negative CT of the head.

## 2023-05-12 ENCOUNTER — Encounter: Payer: Self-pay | Admitting: Family Medicine

## 2023-05-12 ENCOUNTER — Ambulatory Visit: Payer: BC Managed Care – PPO | Admitting: Family Medicine

## 2023-05-12 VITALS — BP 158/78 | HR 87 | Temp 98.6°F | Ht 69.0 in | Wt 216.0 lb

## 2023-05-12 DIAGNOSIS — I10 Essential (primary) hypertension: Secondary | ICD-10-CM

## 2023-05-12 DIAGNOSIS — Z794 Long term (current) use of insulin: Secondary | ICD-10-CM

## 2023-05-12 DIAGNOSIS — M722 Plantar fascial fibromatosis: Secondary | ICD-10-CM

## 2023-05-12 DIAGNOSIS — E1165 Type 2 diabetes mellitus with hyperglycemia: Secondary | ICD-10-CM

## 2023-05-12 LAB — COMPREHENSIVE METABOLIC PANEL
ALT: 33 U/L (ref 0–53)
AST: 28 U/L (ref 0–37)
Albumin: 4.6 g/dL (ref 3.5–5.2)
Alkaline Phosphatase: 52 U/L (ref 39–117)
BUN: 17 mg/dL (ref 6–23)
CO2: 28 meq/L (ref 19–32)
Calcium: 9.7 mg/dL (ref 8.4–10.5)
Chloride: 101 meq/L (ref 96–112)
Creatinine, Ser: 1.13 mg/dL (ref 0.40–1.50)
GFR: 71.42 mL/min (ref >60.00)
Glucose, Bld: 108 mg/dL — ABNORMAL HIGH (ref 70–99)
Potassium: 3.4 meq/L — ABNORMAL LOW (ref 3.5–5.1)
Sodium: 139 meq/L (ref 135–145)
Total Bilirubin: 0.6 mg/dL (ref 0.2–1.2)
Total Protein: 7.8 g/dL (ref 6.0–8.3)

## 2023-05-12 LAB — HEMOGLOBIN A1C: Hgb A1c MFr Bld: 6.9 % — ABNORMAL HIGH (ref 4.6–6.5)

## 2023-05-12 MED ORDER — METOPROLOL SUCCINATE ER 50 MG PO TB24: 50.0000 mg | ORAL_TABLET | Freq: Every day | ORAL | 3 refills | Status: DC

## 2023-05-12 NOTE — Patient Instructions (Signed)
 Stop hydralazine , start Toprol -XL once per day.  No other med changes.  Recheck in 1 month.  If any new side effects, lightheadedness, dizziness or low blood pressures on that reading, let me know.  If blood pressure is above 160 or above 100, call right away and I will change the dose of that medication.  Left foot pain/heel pain is likely plantar fasciitis, try the stretches we discussed at home, ice massage, and see other information below.  Will recheck that in 1 month.  Sooner if worse.  Plantar Fasciitis: What to Know  Your plantar fascia is a band of thick tissue on the bottom of your foot. It connects your heel bone to the base of your toes. If the fascia gets irritated, it can cause pain in your heel or foot. This is called plantar fasciitis. In some cases, plantar fasciitis can make it hard for you to walk or move. The pain is often worse in the morning after sleeping, or after sitting or lying down for a long time. Pain may also be worse after walking or standing for a long time. What are the causes? Plantar fasciitis may be caused by: Standing for a long time. Wearing shoes that don't have good arch support. Doing high-impact activities. These are things that put stress on your joints. They include: Ballet. Aerobic exercises. These are exercises that make your heart beat faster. Being overweight. Having a way of walking, or gait, that isn't normal. Tight muscles in your calf, which is in the back of your lower leg. High arches in your feet, or flat feet. Starting a new sport or activity. What are the signs or symptoms? The main symptom of plantar fasciitis is heel pain. Your pain may get worse after: You take your first steps after a time of rest. This includes in the morning after you wake up, or after you've been sitting or lying down for a while. Standing still for a long time. Pain may lessen after 30-45 minutes of activity, such as gentle walking. How is this  diagnosed? Plantar fasciitis may be diagnosed based on your medical history, your symptoms, and an exam. Your health care provider will check for: A tender spot on the bottom of your foot. A high arch in your foot, or flat feet. Pain when you move your foot. Trouble moving your foot. You may also have tests. These may include: X-rays. Ultrasound. MRI. How is this treated? Treatment depends on how bad your plantar fasciitis is. It may include: RICE therapy. This stands for rest, ice, pressure (compression), and raising (elevating) the foot. Exercises to stretch your calves and plantar fascia. A night splint. This holds your foot in a stretched, upward position while you sleep. Physical therapy. This can help with symptoms. It can also prevent problems in the future. Shots of a steroid medicine called cortisone. This can help with pain and irritation. Extracorporeal shock wave therapy. This uses electric shocks to stimulate your plantar fascia. If other treatments don't help, you may need to have surgery. Follow these instructions at home: Managing pain, stiffness, and swelling  Use ice, an ice pack, or a frozen bottle of water as told. Place a towel between your skin and the ice. Roll the bottom of your foot over the ice or frozen bottle. Do this for 20 minutes, 2-3 times a day. If your skin turns red, take off the ice right away to prevent skin damage. The risk of damage is higher if you can't feel  pain, heat, or cold. Wear shoes that have air-sole or gel-sole cushions. You could also try soft shoe inserts made for plantar fasciitis. Activity Try not to do things that cause pain. Ask what things are safe for you to do. Exercise as told. Try activities that are low impact. This means that they're easier on your joints. They include: Swimming. Water aerobics. Biking. General instructions Take your medicines only as told. Wear a night splint as told. Loosen the splint if your toes  tingle, are numb, or turn cold and blue. Stay at a healthy weight. Work with your provider to lose weight as needed. Contact a health care provider if: Your symptoms don't go away with treatment. You have pain that gets worse. Your pain makes it hard to move or do everyday things. This information is not intended to replace advice given to you by your health care provider. Make sure you discuss any questions you have with your health care provider. Document Revised: 08/19/2022 Document Reviewed: 08/19/2022 Elsevier Patient Education  2024 Else[employer]  Managing Your Hypertension Hypertension, also called high blood pressure, is when the force of the blood pressing against the walls of the arteries is too strong. Arteries are blood vessels that carry blood from your heart throughout your body. Hypertension forces the heart to work harder to pump blood and may cause the arteries to become narrow or stiff. Understanding blood pressure readings A blood pressure reading includes a higher number over a lower number: The first, or top, number is called the systolic pressure. It is a measure of the pressure in your arteries as your heart beats. The second, or bottom number, is called the diastolic pressure. It is a measure of the pressure in your arteries as the heart relaxes. For most people, a normal blood pressure is below 120/80. Your personal target blood pressure may vary depending on your medical conditions, your age, and other factors. Blood pressure is classified into four stages. Based on your blood pressure reading, your health care provider may use the following stages to determine what type of treatment you need, if any. Systolic pressure and diastolic pressure are measured in a unit called millimeters of mercury (mmHg). Normal Systolic pressure: below 120. Diastolic pressure: below 80. Elevated Systolic pressure: 120-129. Diastolic pressure: below 80. Hypertension stage 1 Systolic  pressure: 130-139. Diastolic pressure: 80-89. Hypertension stage 2 Systolic pressure: 140 or above. Diastolic pressure: 90 or above. How can this condition affect me? Managing your hypertension is very important. Over time, hypertension can damage the arteries and decrease blood flow to parts of the body, including the brain, heart, and kidneys. Having untreated or uncontrolled hypertension can lead to: A heart attack. A stroke. A weakened blood vessel (aneurysm). Heart failure. Kidney damage. Eye damage. Memory and concentration problems. Vascular dementia. What actions can I take to manage this condition? Hypertension can be managed by making lifestyle changes and possibly by taking medicines. Your health care provider will help you make a plan to bring your blood pressure within a normal range. You may be referred for counseling on a healthy diet and physical activity. Nutrition  Eat a diet that is high in fiber and potassium, and low in salt (sodium), added sugar, and fat. An example eating plan is called the DASH diet. DASH stands for Dietary Approaches to Stop Hypertension. To eat this way: Eat plenty of fresh fruits and vegetables. Try to fill one-half of your plate at each meal with fruits and vegetables. Eat whole  grains, such as whole-wheat pasta, brown rice, or whole-grain bread. Fill about one-fourth of your plate with whole grains. Eat low-fat dairy products. Avoid fatty cuts of meat, processed or cured meats, and poultry with skin. Fill about one-fourth of your plate with lean proteins such as fish, chicken without skin, beans, eggs, and tofu. Avoid pre-made and processed foods. These tend to be higher in sodium, added sugar, and fat. Reduce your daily sodium intake. Many people with hypertension should eat less than 1,500 mg of sodium a day. Lifestyle  Work with your health care provider to maintain a healthy body weight or to lose weight. Ask what an ideal weight is for  you. Get at least 30 minutes of exercise that causes your heart to beat faster (aerobic exercise) most days of the week. Activities may include walking, swimming, or biking. Include exercise to strengthen your muscles (resistance exercise), such as weight lifting, as part of your weekly exercise routine. Try to do these types of exercises for 30 minutes at least 3 days a week. Do not use any products that contain nicotine or tobacco. These products include cigarettes, chewing tobacco, and vaping devices, such as e-cigarettes. If you need help quitting, ask your health care provider. Control any long-term (chronic) conditions you have, such as high cholesterol or diabetes. Identify your sources of stress and find ways to manage stress. This may include meditation, deep breathing, or making time for fun activities. Alcohol use Do not drink alcohol if: Your health care provider tells you not to drink. You are pregnant, may be pregnant, or are planning to become pregnant. If you drink alcohol: Limit how much you have to: 0-1 drink a day for women. 0-2 drinks a day for men. Know how much alcohol is in your drink. In the [place] one drink equals one 12 oz bottle of beer (355 mL), one 5 oz glass of wine (148 mL), or one 1 oz glass of hard liquor (44 mL). Medicines Your health care provider may prescribe medicine if lifestyle changes are not enough to get your blood pressure under control and if: Your systolic blood pressure is 130 or higher. Your diastolic blood pressure is 80 or higher. Take medicines only as told by your health care provider. Follow the directions carefully. Blood pressure medicines must be taken as told by your health care provider. The medicine does not work as well when you skip doses. Skipping doses also puts you at risk for problems. Monitoring Before you monitor your blood pressure: Do not smoke, drink caffeinated beverages, or exercise within 30 minutes before taking a  measurement. Use the bathroom and empty your bladder (urinate). Sit quietly for at least 5 minutes before taking measurements. Monitor your blood pressure at home as told by your health care provider. To do this: Sit with your back straight and supported. Place your feet flat on the floor. Do not cross your legs. Support your arm on a flat surface, such as a table. Make sure your upper arm is at heart level. Each time you measure, take two or three readings one minute apart and record the results. You may also need to have your blood pressure checked regularly by your health care provider. General information Talk with your health care provider about your diet, exercise habits, and other lifestyle factors that may be contributing to hypertension. Review all the medicines you take with your health care provider because there may be side effects or interactions. Keep all follow-up visits. Your health  care provider can help you create and adjust your plan for managing your high blood pressure. Where to find more information National Heart, Lung, and Blood Institute: [employer].is American Heart Association: www.heart.org Contact a health care provider if: You think you are having a reaction to medicines you have taken. You have repeated (recurrent) headaches. You feel dizzy. You have swelling in your ankles. You have trouble with your vision. Get help right away if: You develop a severe headache or confusion. You have unusual weakness or numbness, or you feel faint. You have severe pain in your chest or abdomen. You vomit repeatedly. You have trouble breathing. These symptoms may be an emergency. Get help right away. Call 911. Do not wait to see if the symptoms will go away. Do not drive yourself to the hospital. Summary Hypertension is when the force of blood pumping through your arteries is too strong. If this condition is not controlled, it may put you at risk for serious  complications. Your personal target blood pressure may vary depending on your medical conditions, your age, and other factors. For most people, a normal blood pressure is less than 120/80. Hypertension is managed by lifestyle changes, medicines, or both. Lifestyle changes to help manage hypertension include losing weight, eating a healthy, low-sodium diet, exercising more, stopping smoking, and limiting alcohol. This information is not intended to replace advice given to you by your health care provider. Make sure you discuss any questions you have with your health care provider. Document Revised: 11/30/2020 Document Reviewed: 11/30/2020 Elsevier Patient Education  2024 [employer].

## 2023-05-12 NOTE — Progress Notes (Signed)
 Subjective:  Patient ID: Kevin Jarvis, male    DOB: [birth year]  Age: 58 y.o. MRN: [id number]  CC:  Chief Complaint  Patient presents with   Medical Management of Chronic Issues    Pt doing well   Foot Pain    Pt notes heel pain on the Lt heel of the foot he has no other symptoms does not effect walking     HPI Kevin Jarvis presents for   Hypertension: Uncontrolled with some medication adherence issues previously.  We have discussed ways to remember medications including setting an alarm on phone, using pill minder.  Treated with hydralazine , losartan  amlodipine  hydrochlorothiazide .  He had been off meds for 3 days prior to his last visit.  We have discussed risks of uncontrolled hypertension including but not limited to stroke, cardiovascular disease, chronic kidney disease.  We also discussed other agents if difficulty with hydralazine  dosing but changes were declined.  Still some difficulty with meds - took combo last night, only misses combo once per week. Hydralazine  has difficult dose. Missing at least one dose per day, sometimes more. Took once yesterday. No new side effects but would like to change to easier formulation.   Home readings: variable readings.  BP Readings from Last 3 Encounters:  05/12/23 (!) 158/78  03/27/23 (!) 142/74  01/29/23 (!) 144/80   Lab Results  Component Value Date   CREATININE 1.13 01/29/2023   Diabetes: New onset July 2022 with microalbuminuria.  Stable A1c last visit, some medication adherence issues.  Treated with metformin  XR 1000mg  every day. Kevin Jarvis  He is on ARB Home readings: up to 180 after sweets, usually 120-130 range.  No symptomatic lows Microalbumin: 4.6 with ratio of 2.3 on 01/29/2023. Optho, foot exam, pneumovax: optho in December. No concerns.   Lab Results  Component Value Date   HGBA1C 6.3 01/29/2023   HGBA1C 6.0 12/24/2021   HGBA1C 6.4 (A) 08/03/2021   Lab Results  Component Value Date   MICROALBUR 4.6 (H) 01/29/2023    LDLCALC 69 01/29/2023   CREATININE 1.13 01/29/2023   L heel pain: Nki.  Notes at night. Sore in heel, notes with first step overnight, and end of day. Better once he starts walking.   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 No pertinent surgical history. Allergies  Allergen Reactions   Chlorthalidone  Other (See Comments)    lightheaded   Prior to Admission medications   Medication Sig Start Date End Date Taking? Authorizing Provider  ACCU-CHEK GUIDE test strip  10/10/20  Yes [provider]  Accu-Chek Softclix Lancets lancets 2 (two) times daily. 10/10/20  Yes [provider]  blood glucose meter kit and supplies Dispense based on patient and insurance preference. Use 2 times daily - fasting or 2 hours after meals. 10/06/20  Yes Ester Helms,  Delpha Fickle, MD  hydrALAZINE  (APRESOLINE ) 50 MG tablet Take 1 tablet (50 mg total) by mouth 3 (three) times daily. 01/29/23  Yes Benjiman Bras, MD  metFORMIN  (GLUCOPHAGE -XR) 500 MG 24 hr tablet Take 2 tablets (1,000 mg total) by mouth daily with breakfast. 01/29/23  Yes Benjiman Bras, MD  Olmesartan -amLODIPine -HCTZ 40-10-25 MG TABS Take 1 tablet by mouth daily. 01/29/23  Yes Benjiman Bras, MD   Social History   Socioeconomic History   Marital status: Married    Spouse name: Not on  file   Number of children: 2   Years of education: Not on file   Highest education level: Not on file  Occupational History   Occupation: Location manager  Tobacco Use   Smoking status: Never   Smokeless tobacco: Never  Vaping Use   Vaping status: Never Used  Substance and Sexual Activity   Alcohol use: Yes    Comment: Social   Drug use: No   Sexual activity: Yes  Other Topics Concern   Not on file  Social History Narrative   From [place] - Moved to [place]  2000   Lives with wife and son   Social Drivers of Corporate investment banker Strain: Not on file  Food Insecurity: Not on file  Transportation Needs: Not on file  Physical Activity: Not on file  Stress: Not on file  Social Connections: Not on file  Intimate Partner Violence: Not on file    Review of Systems  Constitutional:  Negative for fatigue and unexpected weight change.  Eyes:  Negative for visual disturbance.  Respiratory:  Negative for cough, chest tightness and shortness of breath.   Cardiovascular:  Negative for chest pain, palpitations and leg swelling.  Gastrointestinal:  Negative for abdominal pain and blood in stool.  Neurological:  Negative for dizziness, light-headedness and headaches.     Objective:   Vitals:   05/12/23 0824 05/12/23 0834  BP: (!) 174/80 (!) 158/78  Pulse: 87   Temp: 98.6 F (37 C)   TempSrc: Temporal   SpO2: 100%   Weight: 216 lb (98 kg)   Height: 5\' 9"  (1.753 m)      Physical Exam Vitals reviewed.  Constitutional:      Appearance: He is well-developed.  HENT:     Head: Normocephalic and atraumatic.  Neck:     Vascular: No carotid bruit or JVD.  Cardiovascular:     Rate and Rhythm: Normal rate and regular rhythm.     Heart sounds: Normal heart sounds. No murmur heard. Pulmonary:     Effort: Pulmonary effort is normal.     Breath sounds: Normal breath sounds. No rales.  Musculoskeletal:     Right lower leg: No edema.     Left lower leg: No edema.     Comments:  Left foot, nontender, no bony tenderness of ankle or foot.  Describes area of discomfort at the proximal plantar fascia.  Negative lateral heel squeeze.  Skin:    General: Skin is warm and dry.  Neurological:     Mental Status: He is alert and oriented to person, place, and time.  Psychiatric:        Mood and Affect: Mood normal.        Assessment & Plan:  Kevin Jarvis is a 58 y.o. male . Type 2 diabetes mellitus with hyperglycemia, with long-term current use of insulin (HCC)  -Well-controlled previously, tolerating metformin .  Continue same, check A1c and adjust plan accordingly.  Essential  hypertension - Plan: metoprolol  succinate (TOPROL -XL) 50 MG 24 hr tablet  -With some medication adherence, hydralazine  dosing has been difficult.  Will stop hydralazine  at this point, start Toprol -XL initially 50 mg.  Likely will need to adjust dose but with nonadherence on hydralazine  and variable readings will start at 50 mg and close follow-up in 1 month.  RTC precautions if elevated readings or low in the meantime.  Continue combo olmesartan  amlodipine  hydrochlorothiazide  same dose for now.  Plantar fasciitis, left  -Stretches, and treatment discussed, handout provided.  Recheck next visit.  Meds ordered this encounter  Medications   metoprolol  succinate (TOPROL -XL) 50 MG 24 hr tablet    Sig: Take 1 tablet (50 mg total) by mouth daily. Take with or immediately following a meal.    Dispense:  90 tablet    Refill:  3   Patient Instructions  Stop hydralazine , start Toprol -XL once per day.  No other med changes.  Recheck in 1 month.  If any new side effects, lightheadedness, dizziness or low blood pressures on that reading, let me know.  If blood pressure is above 160 or above 100, call right away and I will change the dose of that medication.  Left foot pain/heel pain is likely plantar fasciitis, try the stretches we discussed at home, ice massage, and see other information below.  Will  recheck that in 1 month.  Sooner if worse.  Plantar Fasciitis: What to Know  Your plantar fascia is a band of thick tissue on the bottom of your foot. It connects your heel bone to the base of your toes. If the fascia gets irritated, it can cause pain in your heel or foot. This is called plantar fasciitis. In some cases, plantar fasciitis can make it hard for you to walk or move. The pain is often worse in the morning after sleeping, or after sitting or lying down for a long time. Pain may also be worse after walking or standing for a long time. What are the causes? Plantar fasciitis may be caused by: Standing for a long time. Wearing shoes that don't have good arch support. Doing high-impact activities. These are things that put stress on your joints. They include: Ballet. Aerobic exercises. These are exercises that make your heart beat faster. Being overweight. Having a way of walking, or gait, that isn't normal. Tight muscles in your calf, which is in the back of your lower leg. High arches in your feet, or flat feet. Starting a new sport or activity. What are the signs or symptoms? The main symptom of plantar fasciitis is heel pain. Your pain may get worse after: You take your first steps after a time of rest. This includes in the morning after you wake up, or after you've been sitting or lying down for a while. Standing still for a long time. Pain may lessen after 30-45 minutes of activity, such as gentle walking. How is this diagnosed? Plantar fasciitis may be diagnosed based on your medical history, your symptoms, and an exam. Your health care provider will check for: A tender spot on the bottom of your foot. A high arch in your foot, or flat feet. Pain when you move your foot. Trouble moving your foot. You may also have tests. These may include: X-rays. Ultrasound. MRI. How is this treated? Treatment depends on how bad your plantar fasciitis is. It may include: RICE  therapy. This stands for rest, ice, pressure (compression), and raising (elevating) the foot. Exercises to stretch your  calves and plantar fascia. A night splint. This holds your foot in a stretched, upward position while you sleep. Physical therapy. This can help with symptoms. It can also prevent problems in the future. Shots of a steroid medicine called cortisone. This can help with pain and irritation. Extracorporeal shock wave therapy. This uses electric shocks to stimulate your plantar fascia. If other treatments don't help, you may need to have surgery. Follow these instructions at home: Managing pain, stiffness, and swelling  Use ice, an ice pack, or a frozen bottle of water as told. Place a towel between your skin and the ice. Roll the bottom of your foot over the ice or frozen bottle. Do this for 20 minutes, 2-3 times a day. If your skin turns red, take off the ice right away to prevent skin damage. The risk of damage is higher if you can't feel pain, heat, or cold. Wear shoes that have air-sole or gel-sole cushions. You could also try soft shoe inserts made for plantar fasciitis. Activity Try not to do things that cause pain. Ask what things are safe for you to do. Exercise as told. Try activities that are low impact. This means that they're easier on your joints. They include: Swimming. Water aerobics. Biking. General instructions Take your medicines only as told. Wear a night splint as told. Loosen the splint if your toes tingle, are numb, or turn cold and blue. Stay at a healthy weight. Work with your provider to lose weight as needed. Contact a health care provider if: Your symptoms don't go away with treatment. You have pain that gets worse. Your pain makes it hard to move or do everyday things. This information is not intended to replace advice given to you by your health care provider. Make sure you discuss any questions you have with your health care  provider. Document Revised: 08/19/2022 Document Reviewed: 08/19/2022 Elsevier Patient Education  2024 Elsevier Inc.  Managing Your Hypertension Hypertension, also called high blood pressure, is when the force of the blood pressing against the walls of the arteries is too strong. Arteries are blood vessels that carry blood from your heart throughout your body. Hypertension forces the heart to work harder to pump blood and may cause the arteries to become narrow or stiff. Understanding blood pressure readings A blood pressure reading includes a higher number over a lower number: The first, or top, number is called the systolic pressure. It is a measure of the pressure in your arteries as your heart beats. The second, or bottom number, is called the diastolic pressure. It is a measure of the pressure in your arteries as the heart relaxes. For most people, a normal blood pressure is below 120/80. Your personal target blood pressure may vary depending on your medical conditions, your age, and other factors. Blood pressure is classified into four stages. Based on your blood pressure reading, your health care provider may use the following stages to determine what type of treatment you need, if any. Systolic pressure and diastolic pressure are measured in a unit called millimeters of mercury (mmHg). Normal Systolic pressure: below 120. Diastolic pressure: below 80. Elevated Systolic pressure: 120-129. Diastolic pressure: below 80. Hypertension stage 1 Systolic pressure: 130-139. Diastolic pressure: 80-89. Hypertension stage 2 Systolic pressure: 140 or above. Diastolic pressure: 90 or above. How can this condition affect me? Managing your hypertension is very important. Over time, hypertension can damage the arteries and decrease blood flow to parts of the body, including the brain, heart, and  kidneys. Having untreated or uncontrolled hypertension can lead to: A heart attack. A stroke. A  weakened blood vessel (aneurysm). Heart failure. Kidney damage. Eye damage. Memory and concentration problems. Vascular dementia. What actions can I take to manage this condition? Hypertension can be managed by making lifestyle changes and possibly by taking medicines. Your health care provider will help you make a plan to bring your blood pressure within a normal range. You may be referred for counseling on a healthy diet and physical activity. Nutrition  Eat a diet that is high in fiber and potassium, and low in salt (sodium), added sugar, and fat. An example eating plan is called the DASH diet. DASH stands for Dietary Approaches to Stop Hypertension. To eat this way: Eat plenty of fresh fruits and vegetables. Try to fill one-half of your plate at each meal with fruits and vegetables. Eat whole grains, such as whole-wheat pasta, brown rice, or whole-grain bread. Fill about one-fourth of your plate with whole grains. Eat low-fat dairy products. Avoid fatty cuts of meat, processed or cured meats, and poultry with skin. Fill about one-fourth of your plate with lean proteins such as fish, chicken without skin, beans, eggs, and tofu. Avoid pre-made and processed foods. These tend to be higher in sodium, added sugar, and fat. Reduce your daily sodium intake. Many people with hypertension should eat less than 1,500 mg of sodium a day. Lifestyle  Work with your health care provider to maintain a healthy body weight or to lose weight. Ask what an ideal weight is for you. Get at least 30 minutes of exercise that causes your heart to beat faster (aerobic exercise) most days of the week. Activities may include walking, swimming, or biking. Include exercise to strengthen your muscles (resistance exercise), such as weight lifting, as part of your weekly exercise routine. Try to do these types of exercises for 30 minutes at least 3 days a week. Do not use any products that contain nicotine or tobacco. These  products include cigarettes, chewing tobacco, and vaping devices, such as e-cigarettes. If you need help quitting, ask your health care provider. Control any long-term (chronic) conditions you have, such as high cholesterol or diabetes. Identify your sources of stress and find ways to manage stress. This may include meditation, deep breathing, or making time for fun activities. Alcohol use Do not drink alcohol if: Your health care provider tells you not to drink. You are pregnant, may be pregnant, or are planning to become pregnant. If you drink alcohol: Limit how much you have to: 0-1 drink a day for women. 0-2 drinks a day for men. Know how much alcohol is in your drink. In the [place] one drink equals one 12 oz bottle of beer (355 mL), one 5 oz glass of wine (148 mL), or one 1 oz glass of hard liquor (44 mL). Medicines Your health care provider may prescribe medicine if lifestyle changes are not enough to get your blood pressure under control and if: Your systolic blood pressure is 130 or higher. Your diastolic blood pressure is 80 or higher. Take medicines only as told by your health care provider. Follow the directions carefully. Blood pressure medicines must be taken as told by your health care provider. The medicine does not work as well when you skip doses. Skipping doses also puts you at risk for problems. Monitoring Before you monitor your blood pressure: Do not smoke, drink caffeinated beverages, or exercise within 30 minutes before taking a measurement. Use the bathroom  and empty your bladder (urinate). Sit quietly for at least 5 minutes before taking measurements. Monitor your blood pressure at home as told by your health care provider. To do this: Sit with your back straight and supported. Place your feet flat on the floor. Do not cross your legs. Support your arm on a flat surface, such as a table. Make sure your upper arm is at heart level. Each time you measure, take two or  three readings one minute apart and record the results. You may also need to have your blood pressure checked regularly by your health care provider. General information Talk with your health care provider about your diet, exercise habits, and other lifestyle factors that may be contributing to hypertension. Review all the medicines you take with your health care provider because there may be side effects or interactions. Keep all follow-up visits. Your health care provider can help you create and adjust your plan for managing your high blood pressure. Where to find more information National Heart, Lung, and Blood Institute: PopSteam.is American Heart Association: www.heart.org Contact a health care provider if: You think you are having a reaction to medicines you have taken. You have repeated (recurrent) headaches. You feel dizzy. You have swelling in your ankles. You have trouble with your vision. Get help right away if: You develop a severe headache or confusion. You have unusual weakness or numbness, or you feel faint. You have severe pain in your chest or abdomen. You vomit repeatedly. You have trouble breathing. These symptoms may be an emergency. Get help right away. Call 911. Do not wait to see if the symptoms will go away. Do not drive yourself to the hospital. Summary Hypertension is when the force of blood pumping through your arteries is too strong. If this condition is not controlled, it may put you at risk for serious complications. Your personal target blood pressure may vary depending on your medical conditions, your age, and other factors. For most people, a normal blood pressure is less than 120/80. Hypertension is managed by lifestyle changes, medicines, or both. Lifestyle changes to help manage hypertension include losing weight, eating a healthy, low-sodium diet, exercising more, stopping smoking, and limiting alcohol. This information is not intended to  replace advice given to you by your health care provider. Make sure you discuss any questions you have with your health care provider. Document Revised: 11/30/2020 Document Reviewed: 11/30/2020 Elsevier Patient Education  2024 Elsevier Inc.    Signed,   Caro Christmas, MD [facility], [facility] 05/12/23 8:58 AM

## 2023-05-15 ENCOUNTER — Telehealth: Payer: Self-pay

## 2023-05-15 NOTE — Telephone Encounter (Signed)
-----   Message from Shade Flood sent at 05/13/2023  9:06 PM EST ----- Call patient.  Potassium level was borderline low.  Blood sugar few points elevated but other electrolytes looked okay.  54-month blood sugar test slightly higher but still okay at 6.9.  No medication changes other than potassium rich foods in the diet or over-the-counter potassium supplement once per day.  Let me know if there are questions.

## 2023-05-16 NOTE — Telephone Encounter (Signed)
Called patient to discuss lab work, no answer, left a message for the patient to call back to discuss

## 2023-05-20 NOTE — Telephone Encounter (Signed)
 Called and left message for patient to return call. 3rd call attempt will now send letter.

## 2023-06-13 ENCOUNTER — Ambulatory Visit: Payer: BC Managed Care – PPO | Admitting: Family Medicine

## 2023-06-20 ENCOUNTER — Ambulatory Visit: Admitting: Family Medicine

## 2023-07-17 ENCOUNTER — Ambulatory Visit: Admitting: Family Medicine

## 2023-07-17 ENCOUNTER — Encounter: Payer: Self-pay | Admitting: Family Medicine

## 2023-07-17 ENCOUNTER — Other Ambulatory Visit (HOSPITAL_BASED_OUTPATIENT_CLINIC_OR_DEPARTMENT_OTHER): Payer: Self-pay

## 2023-07-17 VITALS — BP 162/80 | HR 64 | Temp 98.2°F | Ht 69.0 in | Wt 213.4 lb

## 2023-07-17 DIAGNOSIS — I10 Essential (primary) hypertension: Secondary | ICD-10-CM | POA: Diagnosis not present

## 2023-07-17 DIAGNOSIS — M79672 Pain in left foot: Secondary | ICD-10-CM | POA: Diagnosis not present

## 2023-07-17 MED ORDER — OLMESARTAN-AMLODIPINE-HCTZ 40-10-25 MG PO TABS
1.0000 | ORAL_TABLET | Freq: Every day | ORAL | 0 refills | Status: DC
Start: 2023-07-17 — End: 2024-02-16
  Filled 2023-07-17: qty 90, 90d supply, fill #0

## 2023-07-17 MED ORDER — METOPROLOL SUCCINATE ER 100 MG PO TB24
100.0000 mg | ORAL_TABLET | Freq: Every day | ORAL | 1 refills | Status: DC
  Filled 2023-07-17: qty 90, 90d supply, fill #0
  Filled 2024-02-16: qty 90, 90d supply, fill #1

## 2023-07-17 NOTE — Progress Notes (Signed)
 Subjective:  Patient ID: Kevin Jarvis, male    DOB: 1966/01/25  Age: 58 y.o. MRN: [id number]  CC:  Chief Complaint  Patient presents with   Hypertension    Pt has been doing okay, pt BP is elevated in office today but denies physical sxs    Foot Problem    Pt notes still dealing with Lt foot pain he had mentioned last visit     HPI Kevin Jarvis presents for follow up.  Picking up mom from airport today from [place].   Hypertension: Complicated by medication adherence, hydralazine dosing was difficult when discussed at his February visit.  That was discontinued and started Toprol XL initially 50 mg daily.  1 month follow-up planned in February.  He was continued on same dose of olmesartan amlodipine hydrochlorothiazide. No new side effects with toprol XL. Taking daily - only 1-2 missed doses since last visit. No missed doses with combo pill.  EKG with NSR, rate 61 in 2023.  Home readings:none.  BP Readings from Last 3 Encounters:  07/17/23 (!) 162/80  05/12/23 (!) 158/78  03/27/23 (!) 142/74   Lab Results  Component Value Date   CREATININE 1.13 05/12/2023   Foot pain Left-sided foot pain, suspect plantar fasciitis at his February visit.  Handout provided with stretches reviewed.  He has been doing some of the stretches, has used massager on the area as well, slightly improved.  Still sore in same area.  Diabetes: Discussed at his February visit, A1c had crept up slightly at that time.  Metformin XR 1000 mg daily.  He is on ARB as above.  Overall stable home readings, dietary advice given with avoidance of sweets.  Plan on labs next visit.  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 Allergies  Allergen Reactions   Chlorthalidone Other (See Comments)    lightheaded   Prior to Admission medications   Medication Sig Start Date End Date Taking? Authorizing Provider  ACCU-CHEK GUIDE test strip  10/10/20  Yes [provider]  Accu-Chek Softclix Lancets lancets 2 (two) times daily. 10/10/20  Yes [provider]  blood glucose meter kit and supplies Dispense based on patient and insurance preference. Use 2 times daily - fasting or 2 hours after meals. 10/06/20  Yes Shade Flood, MD  metFORMIN (GLUCOPHAGE-XR) 500 MG 24 hr tablet Take 2 tablets (1,000 mg total) by mouth daily with breakfast. 01/29/23  Yes Shade Flood, MD  metoprolol succinate (TOPROL-XL) 50 MG 24  hr tablet Take 1 tablet (50 mg total) by mouth daily. Take with or immediately following a meal. 05/12/23  Yes Shade Flood, MD  Olmesartan-amLODIPine-HCTZ 40-10-25 MG TABS Take 1 tablet by mouth daily. 01/29/23  Yes Shade Flood, MD   Social History   Socioeconomic History   Marital status: Married    Spouse name: Not on file   Number of children: 2   Years of education: Not on file   Highest education level: Not on file  Occupational History   Occupation: Location manager  Tobacco Use   Smoking status: Never   Smokeless tobacco: Never  Vaping Use   Vaping status: Never  Used  Substance and Sexual Activity   Alcohol use: Yes    Comment: Social   Drug use: No   Sexual activity: Yes  Other Topics Concern   Not on file  Social History Narrative   From [place] - Moved to [place] 2000   Lives with wife and son   Social Drivers of Corporate investment banker Strain: Not on file  Food Insecurity: Not on file  Transportation Needs: Not on file  Physical Activity: Not on file  Stress: Not on file  Social Connections: Not on file  Intimate Partner Violence: Not on file    Review of Systems  Constitutional:  Negative for fatigue and unexpected weight change.  Eyes:  Negative for visual disturbance.  Respiratory:  Negative for cough, chest tightness and shortness of breath.   Cardiovascular:  Negative for chest pain, palpitations and leg swelling.  Gastrointestinal:  Negative for abdominal pain and blood in stool.  Neurological:  Negative for dizziness, light-headedness and headaches.     Objective:   Vitals:   07/17/23 1015 07/17/23 1025  BP: (!) 168/80 (!) 162/80  Pulse: 64   Temp: 98.2 F (36.8 C)   TempSrc: Temporal   SpO2: 100%   Weight: 213 lb 6.4 oz (96.8 kg)   Height: 5\' 9"  (1.753 m)      Physical Exam Vitals reviewed.  Constitutional:      Appearance: He is well-developed.  HENT:     Head: Normocephalic and atraumatic.  Neck:     Vascular: No carotid bruit or JVD.  Cardiovascular:     Rate and Rhythm: Normal rate and regular rhythm.     Heart sounds: Normal heart sounds. No murmur heard. Pulmonary:     Effort: Pulmonary effort is normal.     Breath sounds: Normal breath sounds. No rales.  Musculoskeletal:     Right lower leg: No edema.     Left lower leg: No edema.     Comments: Left foot, slight tenderness palpation over the plantar fascia, distal heel but negative lateral heel squeeze.  Skin intact without soft tissue swelling, ecchymosis or other focal bony tenderness.  Skin:    General: Skin is warm and dry.   Neurological:     Mental Status: He is alert and oriented to person, place, and time.  Psychiatric:        Mood and Affect: Mood normal.        Assessment & Plan:  Kevin Jarvis is a 58 y.o. male . Left foot pain - Plan: Ambulatory referral to Podiatry  - Still appears to be plantar fasciitis, does report some improved symptoms, but with persistence will refer to podiatry to evaluate further treatment options or inserts.  Continue plantar fascia stretches, symptomatic care, handout given.  Essential hypertension - Plan: Olmesartan-amLODIPine-HCTZ 40-10-25  MG TABS, metoprolol succinate (TOPROL-XL) 100 MG 24 hr tablet  - Improved adherence on the regimen but still uncontrolled, will increase Toprol-XL to 100 mg daily.  Possible side effects discussed, orthostatic precautions given.  Heart rate similar to prior EKG few years ago.  Continue to monitor with 6-week follow-up, labs at that time.  RTC precautions given.  Meds ordered this encounter  Medications   Olmesartan-amLODIPine-HCTZ 40-10-25 MG TABS    Sig: Take 1 tablet by mouth daily.    Dispense:  90 tablet    Refill:  0   metoprolol succinate (TOPROL-XL) 100 MG 24 hr tablet    Sig: Take 1 tablet (100 mg total) by mouth daily. Take with or immediately following a meal.    Dispense:  90 tablet    Refill:  1   Patient Instructions  Blood pressure still running too high, lets try the higher dose of Toprol-XL 100 mg/day.  If any lightheadedness, dizziness or new side effects let me know right away.  No change in your other medication.  I am referring you to a foot specialist, but continue to try the foot stretches for plantar fasciitis as we discussed previously.  I will see you in 6 weeks and we can check labs at that time.  Take care!   Plantar Fasciitis: What to Know  Your plantar fascia is a band of thick tissue on the bottom of your foot. It connects your heel bone to the base of your toes. If the fascia gets irritated, it  can cause pain in your heel or foot. This is called plantar fasciitis. In some cases, plantar fasciitis can make it hard for you to walk or move. The pain is often worse in the morning after sleeping, or after sitting or lying down for a long time. Pain may also be worse after walking or standing for a long time. What are the causes? Plantar fasciitis may be caused by: Standing for a long time. Wearing shoes that don't have good arch support. Doing high-impact activities. These are things that put stress on your joints. They include: Ballet. Aerobic exercises. These are exercises that make your heart beat faster. Being overweight. Having a way of walking, or gait, that isn't normal. Tight muscles in your calf, which is in the back of your lower leg. High arches in your feet, or flat feet. Starting a new sport or activity. What are the signs or symptoms? The main symptom of plantar fasciitis is heel pain. Your pain may get worse after: You take your first steps after a time of rest. This includes in the morning after you wake up, or after you've been sitting or lying down for a while. Standing still for a long time. Pain may lessen after 30-45 minutes of activity, such as gentle walking. How is this diagnosed? Plantar fasciitis may be diagnosed based on your medical history, your symptoms, and an exam. Your health care provider will check for: A tender spot on the bottom of your foot. A high arch in your foot, or flat feet. Pain when you move your foot. Trouble moving your foot. You may also have tests. These may include: X-rays. Ultrasound. MRI. How is this treated? Treatment depends on how bad your plantar fasciitis is. It may include: RICE therapy. This stands for rest, ice, pressure (compression), and raising (elevating) the foot. Exercises to stretch your calves and plantar fascia. A night splint. This holds your foot in a stretched, upward position while you sleep.  Physical  therapy. This can help with symptoms. It can also prevent problems in the future. Shots of a steroid medicine called cortisone. This can help with pain and irritation. Extracorporeal shock wave therapy. This uses electric shocks to stimulate your plantar fascia. If other treatments don't help, you may need to have surgery. Follow these instructions at home: Managing pain, stiffness, and swelling  Use ice, an ice pack, or a frozen bottle of water as told. Place a towel between your skin and the ice. Roll the bottom of your foot over the ice or frozen bottle. Do this for 20 minutes, 2-3 times a day. If your skin turns red, take off the ice right away to prevent skin damage. The risk of damage is higher if you can't feel pain, heat, or cold. Wear shoes that have air-sole or gel-sole cushions. You could also try soft shoe inserts made for plantar fasciitis. Activity Try not to do things that cause pain. Ask what things are safe for you to do. Exercise as told. Try activities that are low impact. This means that they're easier on your joints. They include: Swimming. Water aerobics. Biking. General instructions Take your medicines only as told. Wear a night splint as told. Loosen the splint if your toes tingle, are numb, or turn cold and blue. Stay at a healthy weight. Work with your provider to lose weight as needed. Contact a health care provider if: Your symptoms don't go away with treatment. You have pain that gets worse. Your pain makes it hard to move or do everyday things. This information is not intended to replace advice given to you by your health care provider. Make sure you discuss any questions you have with your health care provider. Document Revised: 08/19/2022 Document Reviewed: 08/19/2022 Elsevier Patient Education  2024 Elsevier [employer]. Managing Your Hypertension Hypertension, also called high blood pressure, is when the force of the blood pressing against the walls of the  arteries is too strong. Arteries are blood vessels that carry blood from your heart throughout your body. Hypertension forces the heart to work harder to pump blood and may cause the arteries to become narrow or stiff. Understanding blood pressure readings A blood pressure reading includes a higher number over a lower number: The first, or top, number is called the systolic pressure. It is a measure of the pressure in your arteries as your heart beats. The second, or bottom number, is called the diastolic pressure. It is a measure of the pressure in your arteries as the heart relaxes. For most people, a normal blood pressure is below 120/80. Your personal target blood pressure may vary depending on your medical conditions, your age, and other factors. Blood pressure is classified into four stages. Based on your blood pressure reading, your health care provider may use the following stages to determine what type of treatment you need, if any. Systolic pressure and diastolic pressure are measured in a unit called millimeters of mercury (mmHg). Normal Systolic pressure: below 120. Diastolic pressure: below 80. Elevated Systolic pressure: 120-129. Diastolic pressure: below 80. Hypertension stage 1 Systolic pressure: 130-139. Diastolic pressure: 80-89. Hypertension stage 2 Systolic pressure: 140 or above. Diastolic pressure: 90 or above. How can this condition affect me? Managing your hypertension is very important. Over time, hypertension can damage the arteries and decrease blood flow to parts of the body, including the brain, heart, and kidneys. Having untreated or uncontrolled hypertension can lead to: A heart attack. A stroke. A weakened blood vessel (aneurysm).  Heart failure. Kidney damage. Eye damage. Memory and concentration problems. Vascular dementia. What actions can I take to manage this condition? Hypertension can be managed by making lifestyle changes and possibly by taking  medicines. Your health care provider will help you make a plan to bring your blood pressure within a normal range. You may be referred for counseling on a healthy diet and physical activity. Nutrition  Eat a diet that is high in fiber and potassium, and low in salt (sodium), added sugar, and fat. An example eating plan is called the DASH diet. DASH stands for Dietary Approaches to Stop Hypertension. To eat this way: Eat plenty of fresh fruits and vegetables. Try to fill one-half of your plate at each meal with fruits and vegetables. Eat whole grains, such as whole-wheat pasta, brown rice, or whole-grain bread. Fill about one-fourth of your plate with whole grains. Eat low-fat dairy products. Avoid fatty cuts of meat, processed or cured meats, and poultry with skin. Fill about one-fourth of your plate with lean proteins such as fish, chicken without skin, beans, eggs, and tofu. Avoid pre-made and processed foods. These tend to be higher in sodium, added sugar, and fat. Reduce your daily sodium intake. Many people with hypertension should eat less than 1,500 mg of sodium a day. Lifestyle  Work with your health care provider to maintain a healthy body weight or to lose weight. Ask what an ideal weight is for you. Get at least 30 minutes of exercise that causes your heart to beat faster (aerobic exercise) most days of the week. Activities may include walking, swimming, or biking. Include exercise to strengthen your muscles (resistance exercise), such as weight lifting, as part of your weekly exercise routine. Try to do these types of exercises for 30 minutes at least 3 days a week. Do not use any products that contain nicotine or tobacco. These products include cigarettes, chewing tobacco, and vaping devices, such as e-cigarettes. If you need help quitting, ask your health care provider. Control any long-term (chronic) conditions you have, such as high cholesterol or diabetes. Identify your sources of  stress and find ways to manage stress. This may include meditation, deep breathing, or making time for fun activities. Alcohol use Do not drink alcohol if: Your health care provider tells you not to drink. You are pregnant, may be pregnant, or are planning to become pregnant. If you drink alcohol: Limit how much you have to: 0-1 drink a day for women. 0-2 drinks a day for men. Know how much alcohol is in your drink. In the [place] one drink equals one 12 oz bottle of beer (355 mL), one 5 oz glass of wine (148 mL), or one 1 oz glass of hard liquor (44 mL). Medicines Your health care provider may prescribe medicine if lifestyle changes are not enough to get your blood pressure under control and if: Your systolic blood pressure is 130 or higher. Your diastolic blood pressure is 80 or higher. Take medicines only as told by your health care provider. Follow the directions carefully. Blood pressure medicines must be taken as told by your health care provider. The medicine does not work as well when you skip doses. Skipping doses also puts you at risk for problems. Monitoring Before you monitor your blood pressure: Do not smoke, drink caffeinated beverages, or exercise within 30 minutes before taking a measurement. Use the bathroom and empty your bladder (urinate). Sit quietly for at least 5 minutes before taking measurements. Monitor your blood pressure  at home as told by your health care provider. To do this: Sit with your back straight and supported. Place your feet flat on the floor. Do not cross your legs. Support your arm on a flat surface, such as a table. Make sure your upper arm is at heart level. Each time you measure, take two or three readings one minute apart and record the results. You may also need to have your blood pressure checked regularly by your health care provider. General information Talk with your health care provider about your diet, exercise habits, and other lifestyle  factors that may be contributing to hypertension. Review all the medicines you take with your health care provider because there may be side effects or interactions. Keep all follow-up visits. Your health care provider can help you create and adjust your plan for managing your high blood pressure. Where to find more information National Heart, Lung, and Blood Institute: PopSteam.is American Heart Association: www.heart.org Contact a health care provider if: You think you are having a reaction to medicines you have taken. You have repeated (recurrent) headaches. You feel dizzy. You have swelling in your ankles. You have trouble with your vision. Get help right away if: You develop a severe headache or confusion. You have unusual weakness or numbness, or you feel faint. You have severe pain in your chest or abdomen. You vomit repeatedly. You have trouble breathing. These symptoms may be an emergency. Get help right away. Call 911. Do not wait to see if the symptoms will go away. Do not drive yourself to the hospital. Summary Hypertension is when the force of blood pumping through your arteries is too strong. If this condition is not controlled, it may put you at risk for serious complications. Your personal target blood pressure may vary depending on your medical conditions, your age, and other factors. For most people, a normal blood pressure is less than 120/80. Hypertension is managed by lifestyle changes, medicines, or both. Lifestyle changes to help manage hypertension include losing weight, eating a healthy, low-sodium diet, exercising more, stopping smoking, and limiting alcohol. This information is not intended to replace advice given to you by your health care provider. Make sure you discuss any questions you have with your health care provider. Document Revised: 11/30/2020 Document Reviewed: 11/30/2020 Elsevier Patient Education  2024 Elsevier Inc.    Signed,    Caro Christmas, MD [facility], [facility] 07/17/23 10:55 AM

## 2023-07-17 NOTE — Patient Instructions (Signed)
 Blood pressure still running too high, lets try the higher dose of Toprol-XL 100 mg/day.  If any lightheadedness, dizziness or new side effects let me know right away.  No change in your other medication.  I am referring you to a foot specialist, but continue to try the foot stretches for plantar fasciitis as we discussed previously.  I will see you in 6 weeks and we can check labs at that time.  Take care!   Plantar Fasciitis: What to Know  Your plantar fascia is a band of thick tissue on the bottom of your foot. It connects your heel bone to the base of your toes. If the fascia gets irritated, it can cause pain in your heel or foot. This is called plantar fasciitis. In some cases, plantar fasciitis can make it hard for you to walk or move. The pain is often worse in the morning after sleeping, or after sitting or lying down for a long time. Pain may also be worse after walking or standing for a long time. What are the causes? Plantar fasciitis may be caused by: Standing for a long time. Wearing shoes that don't have good arch support. Doing high-impact activities. These are things that put stress on your joints. They include: Ballet. Aerobic exercises. These are exercises that make your heart beat faster. Being overweight. Having a way of walking, or gait, that isn't normal. Tight muscles in your calf, which is in the back of your lower leg. High arches in your feet, or flat feet. Starting a new sport or activity. What are the signs or symptoms? The main symptom of plantar fasciitis is heel pain. Your pain may get worse after: You take your first steps after a time of rest. This includes in the morning after you wake up, or after you've been sitting or lying down for a while. Standing still for a long time. Pain may lessen after 30-45 minutes of activity, such as gentle walking. How is this diagnosed? Plantar fasciitis may be diagnosed based on your medical history, your symptoms, and an  exam. Your health care provider will check for: A tender spot on the bottom of your foot. A high arch in your foot, or flat feet. Pain when you move your foot. Trouble moving your foot. You may also have tests. These may include: X-rays. Ultrasound. MRI. How is this treated? Treatment depends on how bad your plantar fasciitis is. It may include: RICE therapy. This stands for rest, ice, pressure (compression), and raising (elevating) the foot. Exercises to stretch your calves and plantar fascia. A night splint. This holds your foot in a stretched, upward position while you sleep. Physical therapy. This can help with symptoms. It can also prevent problems in the future. Shots of a steroid medicine called cortisone. This can help with pain and irritation. Extracorporeal shock wave therapy. This uses electric shocks to stimulate your plantar fascia. If other treatments don't help, you may need to have surgery. Follow these instructions at home: Managing pain, stiffness, and swelling  Use ice, an ice pack, or a frozen bottle of water as told. Place a towel between your skin and the ice. Roll the bottom of your foot over the ice or frozen bottle. Do this for 20 minutes, 2-3 times a day. If your skin turns red, take off the ice right away to prevent skin damage. The risk of damage is higher if you can't feel pain, heat, or cold. Wear shoes that have air-sole or gel-sole cushions.  You could also try soft shoe inserts made for plantar fasciitis. Activity Try not to do things that cause pain. Ask what things are safe for you to do. Exercise as told. Try activities that are low impact. This means that they're easier on your joints. They include: Swimming. Water aerobics. Biking. General instructions Take your medicines only as told. Wear a night splint as told. Loosen the splint if your toes tingle, are numb, or turn cold and blue. Stay at a healthy weight. Work with your provider to lose  weight as needed. Contact a health care provider if: Your symptoms don't go away with treatment. You have pain that gets worse. Your pain makes it hard to move or do everyday things. This information is not intended to replace advice given to you by your health care provider. Make sure you discuss any questions you have with your health care provider. Document Revised: 08/19/2022 Document Reviewed: 08/19/2022 Elsevier Patient Education  2024 [employer] Managing Your Hypertension Hypertension, also called high blood pressure, is when the force of the blood pressing against the walls of the arteries is too strong. Arteries are blood vessels that carry blood from your heart throughout your body. Hypertension forces the heart to work harder to pump blood and may cause the arteries to become narrow or stiff. Understanding blood pressure readings A blood pressure reading includes a higher number over a lower number: The first, or top, number is called the systolic pressure. It is a measure of the pressure in your arteries as your heart beats. The second, or bottom number, is called the diastolic pressure. It is a measure of the pressure in your arteries as the heart relaxes. For most people, a normal blood pressure is below 120/80. Your personal target blood pressure may vary depending on your medical conditions, your age, and other factors. Blood pressure is classified into four stages. Based on your blood pressure reading, your health care provider may use the following stages to determine what type of treatment you need, if any. Systolic pressure and diastolic pressure are measured in a unit called millimeters of mercury (mmHg). Normal Systolic pressure: below 120. Diastolic pressure: below 80. Elevated Systolic pressure: 120-129. Diastolic pressure: below 80. Hypertension stage 1 Systolic pressure: 130-139. Diastolic pressure: 80-89. Hypertension stage 2 Systolic pressure: 140 or  above. Diastolic pressure: 90 or above. How can this condition affect me? Managing your hypertension is very important. Over time, hypertension can damage the arteries and decrease blood flow to parts of the body, including the brain, heart, and kidneys. Having untreated or uncontrolled hypertension can lead to: A heart attack. A stroke. A weakened blood vessel (aneurysm). Heart failure. Kidney damage. Eye damage. Memory and concentration problems. Vascular dementia. What actions can I take to manage this condition? Hypertension can be managed by making lifestyle changes and possibly by taking medicines. Your health care provider will help you make a plan to bring your blood pressure within a normal range. You may be referred for counseling on a healthy diet and physical activity. Nutrition  Eat a diet that is high in fiber and potassium, and low in salt (sodium), added sugar, and fat. An example eating plan is called the DASH diet. DASH stands for Dietary Approaches to Stop Hypertension. To eat this way: Eat plenty of fresh fruits and vegetables. Try to fill one-half of your plate at each meal with fruits and vegetables. Eat whole grains, such as whole-wheat pasta, brown rice, or whole-grain bread. Fill about one-fourth  of your plate with whole grains. Eat low-fat dairy products. Avoid fatty cuts of meat, processed or cured meats, and poultry with skin. Fill about one-fourth of your plate with lean proteins such as fish, chicken without skin, beans, eggs, and tofu. Avoid pre-made and processed foods. These tend to be higher in sodium, added sugar, and fat. Reduce your daily sodium intake. Many people with hypertension should eat less than 1,500 mg of sodium a day. Lifestyle  Work with your health care provider to maintain a healthy body weight or to lose weight. Ask what an ideal weight is for you. Get at least 30 minutes of exercise that causes your heart to beat faster (aerobic exercise)  most days of the week. Activities may include walking, swimming, or biking. Include exercise to strengthen your muscles (resistance exercise), such as weight lifting, as part of your weekly exercise routine. Try to do these types of exercises for 30 minutes at least 3 days a week. Do not use any products that contain nicotine or tobacco. These products include cigarettes, chewing tobacco, and vaping devices, such as e-cigarettes. If you need help quitting, ask your health care provider. Control any long-term (chronic) conditions you have, such as high cholesterol or diabetes. Identify your sources of stress and find ways to manage stress. This may include meditation, deep breathing, or making time for fun activities. Alcohol use Do not drink alcohol if: Your health care provider tells you not to drink. You are pregnant, may be pregnant, or are planning to become pregnant. If you drink alcohol: Limit how much you have to: 0-1 drink a day for women. 0-2 drinks a day for men. Know how much alcohol is in your drink. In the [place] one drink equals one 12 oz bottle of beer (355 mL), one 5 oz glass of wine (148 mL), or one 1 oz glass of hard liquor (44 mL). Medicines Your health care provider may prescribe medicine if lifestyle changes are not enough to get your blood pressure under control and if: Your systolic blood pressure is 130 or higher. Your diastolic blood pressure is 80 or higher. Take medicines only as told by your health care provider. Follow the directions carefully. Blood pressure medicines must be taken as told by your health care provider. The medicine does not work as well when you skip doses. Skipping doses also puts you at risk for problems. Monitoring Before you monitor your blood pressure: Do not smoke, drink caffeinated beverages, or exercise within 30 minutes before taking a measurement. Use the bathroom and empty your bladder (urinate). Sit quietly for at least 5 minutes  before taking measurements. Monitor your blood pressure at home as told by your health care provider. To do this: Sit with your back straight and supported. Place your feet flat on the floor. Do not cross your legs. Support your arm on a flat surface, such as a table. Make sure your upper arm is at heart level. Each time you measure, take two or three readings one minute apart and record the results. You may also need to have your blood pressure checked regularly by your health care provider. General information Talk with your health care provider about your diet, exercise habits, and other lifestyle factors that may be contributing to hypertension. Review all the medicines you take with your health care provider because there may be side effects or interactions. Keep all follow-up visits. Your health care provider can help you create and adjust your plan for managing your  high blood pressure. Where to find more information National Heart, Lung, and Blood Institute: PopSte[employer].is American Heart Association: www.heart.org Contact a health care provider if: You think you are having a reaction to medicines you have taken. You have repeated (recurrent) headaches. You feel dizzy. You have swelling in your ankles. You have trouble with your vision. Get help right away if: You develop a severe headache or confusion. You have unusual weakness or numbness, or you feel faint. You have severe pain in your chest or abdomen. You vomit repeatedly. You have trouble breathing. These symptoms may be an emergency. Get help right away. Call 911. Do not wait to see if the symptoms will go away. Do not drive yourself to the hospital. Summary Hypertension is when the force of blood pumping through your arteries is too strong. If this condition is not controlled, it may put you at risk for serious complications. Your personal target blood pressure may vary depending on your medical conditions, your age,  and other factors. For most people, a normal blood pressure is less than 120/80. Hypertension is managed by lifestyle changes, medicines, or both. Lifestyle changes to help manage hypertension include losing weight, eating a healthy, low-sodium diet, exercising more, stopping smoking, and limiting alcohol. This information is not intended to replace advice given to you by your health care provider. Make sure you discuss any questions you have with your health care provider. Document Revised: 11/30/2020 Document Reviewed: 11/30/2020 Elsevier Patient Education  2024 [employer].

## 2023-07-18 ENCOUNTER — Other Ambulatory Visit (HOSPITAL_BASED_OUTPATIENT_CLINIC_OR_DEPARTMENT_OTHER): Payer: Self-pay

## 2023-08-29 ENCOUNTER — Ambulatory Visit: Admitting: Family Medicine

## 2023-09-22 ENCOUNTER — Ambulatory Visit: Admitting: Family Medicine

## 2023-10-16 ENCOUNTER — Ambulatory Visit: Admitting: Family Medicine

## 2023-11-20 ENCOUNTER — Ambulatory Visit: Admitting: Family Medicine

## 2024-02-16 ENCOUNTER — Other Ambulatory Visit: Payer: Self-pay | Admitting: Family Medicine

## 2024-02-16 ENCOUNTER — Other Ambulatory Visit (HOSPITAL_BASED_OUTPATIENT_CLINIC_OR_DEPARTMENT_OTHER): Payer: Self-pay

## 2024-02-16 DIAGNOSIS — I10 Essential (primary) hypertension: Secondary | ICD-10-CM

## 2024-02-16 MED ORDER — OLMESARTAN-AMLODIPINE-HCTZ 40-10-25 MG PO TABS
1.0000 | ORAL_TABLET | Freq: Every day | ORAL | 0 refills | Status: DC
  Filled 2024-02-16: qty 90, 90d supply, fill #0

## 2024-02-18 ENCOUNTER — Other Ambulatory Visit (HOSPITAL_BASED_OUTPATIENT_CLINIC_OR_DEPARTMENT_OTHER): Payer: Self-pay

## 2024-02-27 ENCOUNTER — Other Ambulatory Visit (HOSPITAL_BASED_OUTPATIENT_CLINIC_OR_DEPARTMENT_OTHER): Payer: Self-pay

## 2024-03-26 ENCOUNTER — Ambulatory Visit: Admitting: Family Medicine

## 2024-03-26 ENCOUNTER — Telehealth: Payer: Self-pay

## 2024-03-26 NOTE — Telephone Encounter (Signed)
 patient was scheduled on another providers schedule. needs to be scheduled with Dr. Levora. I called patient and left a vm to return call to schedule with Dr. Levora.

## 2024-03-26 NOTE — Telephone Encounter (Signed)
 Appt scheduled in Feb 2026

## 2024-05-05 ENCOUNTER — Encounter: Payer: Self-pay | Admitting: Family Medicine

## 2024-05-05 ENCOUNTER — Ambulatory Visit: Admitting: Family Medicine

## 2024-05-05 VITALS — BP 180/102 | HR 65 | Temp 98.1°F | Resp 18 | Ht 69.0 in | Wt 219.6 lb

## 2024-05-05 DIAGNOSIS — Z23 Encounter for immunization: Secondary | ICD-10-CM

## 2024-05-05 DIAGNOSIS — I10 Essential (primary) hypertension: Secondary | ICD-10-CM

## 2024-05-05 DIAGNOSIS — Z794 Long term (current) use of insulin: Secondary | ICD-10-CM

## 2024-05-05 DIAGNOSIS — Z91148 Patient's other noncompliance with medication regimen for other reason: Secondary | ICD-10-CM

## 2024-05-05 LAB — COMPREHENSIVE METABOLIC PANEL WITH GFR
ALT: 17 U/L (ref 3–53)
AST: 19 U/L (ref 5–37)
Albumin: 4.3 g/dL (ref 3.5–5.2)
Alkaline Phosphatase: 63 U/L (ref 39–117)
BUN: 14 mg/dL (ref 6–23)
CO2: 25 meq/L (ref 19–32)
Calcium: 9.6 mg/dL (ref 8.4–10.5)
Chloride: 104 meq/L (ref 96–112)
Creatinine, Ser: 1.07 mg/dL (ref 0.40–1.50)
GFR: 75.73 mL/min
Glucose, Bld: 152 mg/dL — ABNORMAL HIGH (ref 70–99)
Potassium: 4.2 meq/L (ref 3.5–5.1)
Sodium: 139 meq/L (ref 135–145)
Total Bilirubin: 0.6 mg/dL (ref 0.2–1.2)
Total Protein: 7.2 g/dL (ref 6.0–8.3)

## 2024-05-05 LAB — LIPID PANEL
Cholesterol: 126 mg/dL (ref 28–200)
HDL: 42 mg/dL
LDL Cholesterol: 74 mg/dL (ref 10–99)
NonHDL: 84.38
Total CHOL/HDL Ratio: 3
Triglycerides: 54 mg/dL (ref 10.0–149.0)
VLDL: 10.8 mg/dL (ref 0.0–40.0)

## 2024-05-05 LAB — HEMOGLOBIN A1C: Hgb A1c MFr Bld: 6.9 % — ABNORMAL HIGH (ref 4.6–6.5)

## 2024-05-05 LAB — MICROALBUMIN / CREATININE URINE RATIO
Creatinine,U: 200.6 mg/dL
Microalb Creat Ratio: 45.3 mg/g — ABNORMAL HIGH (ref 0.0–30.0)
Microalb, Ur: 9.1 mg/dL — ABNORMAL HIGH (ref 0.7–1.9)

## 2024-05-05 MED ORDER — AMLODIPINE-OLMESARTAN 10-40 MG PO TABS: 1.0000 | ORAL_TABLET | Freq: Every day | ORAL | 1 refills | Status: DC

## 2024-05-05 MED ORDER — HYDROCHLOROTHIAZIDE 25 MG PO TABS: 25.0000 mg | ORAL_TABLET | Freq: Every day | ORAL | 1 refills | Status: AC

## 2024-05-05 MED ORDER — METOPROLOL SUCCINATE ER 100 MG PO TB24: 100.0000 mg | ORAL_TABLET | Freq: Every day | ORAL | 1 refills | Status: AC

## 2024-05-05 NOTE — Progress Notes (Signed)
 "  Subjective:  Patient ID: Kevin Jarvis, male    DOB: Nov 02, 1965  Age: 59 y.o. MRN: [id number]  CC:  Chief Complaint  Patient presents with   Follow-up    On HTN. Patient checks BP at home but he does not trust his machine. Would like DM taken off his record. He stated he does not have diabetes.     HPI Kevin Jarvis presents for  Chronic condition follow up. Denies barriers to care.   Diabetes: New onset diagnosed July 2022 with glucose 596 in June at cardiology, 480 at his July 8 visit with me, A1c 16.2.  Previous prediabetes level.initially treated with Lantus , metformin , also complicated by microalbuminuria.  Has been able to transition to just metformin , last testing in February of last year.   Metformin  XR 1000 mg daily, on ARB.As above, he has concerns regarding the diagnosis of diabetes.  We discussed this in office today. Home readings: 110-120. Some 90's. No 200's.  Stopped metformin  on his own - no side effects- once a week or two because blood sugars look ok at home.  No symptomatic lows.  Microalbumin: Due, history of microalbuminuria, on ARB Optho, foot exam, pneumovax:  Ophthalmology exam: - [facility] about a month ago - no concerns.    Lab Results  Component Value Date   HGBA1C 6.9 (H) 05/12/2023   HGBA1C 6.3 01/29/2023   HGBA1C 6.0 12/24/2021   Lab Results  Component Value Date   LDLCALC 69 01/29/2023   CREATININE 1.13 05/12/2023   Hypertension: Uncontrolled, complicated by medication adherence issues previously.  We have discussed ways to remember medications including setting alarm or other techniques.  We have discussed risks of uncontrolled hypertension including but not limited to stroke, cardiovascular disease, chronic kidney disease and we have discussed other agents if difficulty with dosing but changes have been declined previously.  Previously has been on hydralazine , losartan , amlodipine , hydrochlorothiazide . Most recent visit April 2025.  Difficulty  with medication adherence with hydralazine , discontinued previously, and was taking Toprol -XL 50 mg daily.  Continued on olmesartan , amlodipine , hydrochlorothiazide .  Blood pressure was still elevated at that time, Toprol -XL was increased to 100 mg daily and 6-week follow-up recommended.  Has not been seen since that time. Rare HA, no chest pain, no focal weakness, no slurred speech. Able to urinate.  Stopped taking the olmesartan /amlodipine /hydrochlorothiazide  last year d/t cost - increased form $90-$300.  Has been taking toprol  XL consistently. When on all meds had better BP - 140 range.   BP Readings from Last 3 Encounters:  05/05/24 (!) 180/102  07/17/23 (!) 162/80  05/12/23 (!) 158/78   Lab Results  Component Value Date   CREATININE 1.13 05/12/2023    HM: Flu vaccine today. Discussed concern of it causing flu in past.  Covid booster at pharmacy if he chooses.  Tdap in 12/2014    Immunization History  Administered Date(s) Administered   Influenza Split 12/12/2011   Influenza, Seasonal, Injecte, Preservative Fre 01/29/2023   Influenza,inj,Quad PF,6+ Mos 04/15/2014, 12/06/2020, 12/24/2021   Janssen (J&J) SARS-COV-2 Vaccination 07/21/2019   PFIZER Comirnaty(Gray Top)Covid-19 Tri-Sucrose Vaccine 11/08/2020   PNEUMOCOCCAL CONJUGATE-20 10/12/2020   Pneumococcal Polysaccharide-23 12/06/2020   Tdap 05/15/2005, 08/05/2013, 01/20/2015   Zoster Recombinant(Shingrix) 01/14/2017, 10/12/2020, 01/12/2021     History Patient Active Problem List   Diagnosis Date Noted   Eales disease, 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 No pertinent surgical history. Allergies[1] Prior to Admission medications  Medication Sig Start Date End Date Taking? Authorizing Provider  ACCU-CHEK GUIDE test strip  10/10/20  Yes [provider]  Accu-Chek Softclix Lancets lancets 2 (two) times daily. 10/10/20  Yes [provider]  blood glucose meter kit and supplies Dispense based on patient and insurance preference. Use 2 times daily - fasting or 2 hours after meals. 10/06/20  Yes Levora Reyes SAUNDERS, MD  metoprolol  succinate (TOPROL -XL) 100 MG 24 hr tablet Take 1 tablet (100 mg total) by mouth daily. Take with or immediately following a meal. 07/17/23  Yes Levora Reyes SAUNDERS, MD  metFORMIN  (GLUCOPHAGE -XR) 500 MG 24 hr tablet Take 2 tablets (1,000 mg total) by mouth daily with breakfast. Patient not taking: Reported on 05/05/2024 01/29/23   Levora Reyes SAUNDERS, MD  Olmesartan -amLODIPine -HCTZ 40-10-25 MG TABS Take 1 tablet by mouth daily. Patient not taking: Reported on 05/05/2024 02/16/24   Levora Reyes SAUNDERS, MD   Social History   Socioeconomic History   Marital status: Married    Spouse name: Not on file   Number of children: 2   Years of education: Not on file   Highest education level: Not on file  Occupational History   Occupation: location manager  Tobacco Use   Smoking status: Never   Smokeless tobacco: Never  Vaping Use   Vaping status: Never Used  Substance and Sexual Activity   Alcohol use: Yes    Comment: Social   Drug use: No   Sexual activity: Yes  Other Topics Concern   Not on file  Social History Narrative   From [place] - Moved to [place]  2000   Lives with wife and son   Social Drivers of Health   Tobacco Use: Low Risk (05/05/2024)   Patient History    Smoking Tobacco Use: Never    Smokeless  Tobacco Use: Never    Passive Exposure: Not on file  Financial Resource Strain: Not on file  Food Insecurity: Not on file  Transportation Needs: Not on file  Physical Activity: Not on file  Stress: Not on file  Social Connections: Not on file  Intimate Partner Violence: Not on file  Depression (PHQ2-9): Low Risk (07/17/2023)   Depression (PHQ2-9)    PHQ-2 Score: 0  Alcohol Screen: Not on file  Housing: Not on file  Utilities: Not on file  Health Literacy: Not on file    Review of Systems  Constitutional:  Negative for fatigue and unexpected weight change.  Eyes:  Negative for visual disturbance.  Respiratory:  Negative for cough, chest tightness and shortness of breath.   Cardiovascular:  Negative for chest pain, palpitations and leg swelling.  Gastrointestinal:  Negative for abdominal pain and blood in stool.  Neurological:  Negative for dizziness, light-headedness and headaches.     Objective:   Vitals:   05/05/24 0903  BP: (!) 180/102  Pulse: 65  Resp: 18  Temp: 98.1 F (36.7 C)  TempSrc: Temporal  SpO2: 99%  Weight: 219 lb 9.6 oz (99.6 kg)  Height: 5' 9 (1.753 m)     Physical Exam Vitals reviewed.  Constitutional:      Appearance: He is well-developed.  HENT:     Head: Normocephalic and atraumatic.  Neck:     Vascular: No carotid bruit or JVD.  Cardiovascular:     Rate and Rhythm: Normal rate and regular rhythm.     Heart sounds: Normal heart sounds. No murmur heard. Pulmonary:     Effort: Pulmonary effort is normal.     Breath sounds: Normal breath sounds. No rales.  Musculoskeletal:     Right lower leg: No edema.     Left lower leg: No edema.  Skin:    General: Skin is warm and dry.  Neurological:     Mental Status: He is alert and oriented to person, place, and time.  Psychiatric:        Mood and Affect: Mood normal.        Assessment & Plan:  Kevin Jarvis is a 60 y.o. male . Type 2 diabetes mellitus with hyperglycemia, with  long-term current use of insulin (HCC) - Plan: Urine Albumin/Creatinine with ratio (send out) [LAB689], Hemoglobin A1c, Comprehensive metabolic panel with GFR, Lipid panel  - Check updated A1c and adjust plan accordingly.  He has infrequently taken metformin  but based on home readings may be able to try diet/exercise approach.  Will discuss plan further once labs return, 1 month follow-up.  We did discuss the diagnosis of diabetes, prior readings, and most recent readings, with the concept of managed diabetes but not resolution of that condition. Understanding expressed.  Essential hypertension - Plan: Comprehensive metabolic panel with GFR, hydrochlorothiazide  (HYDRODIURIL ) 25 MG tablet, metoprolol  succinate (TOPROL -XL) 100 MG 24 hr tablet, amLODipine -olmesartan  (AZOR ) 10-40 MG tablet Nonadherence to medication  - Uncontrolled off meds as above.  He denies any symptoms but we again discussed significant risks of untreated hypertension.  Understanding expressed.  Advised to let me know in the future if any concerns regarding cost of medications as we could have made some changes.  For now we will try combination of olmesartan  and amlodipine  as that appears to be less costly, separate prescription for hydrochlorothiazide .  Can change regimen further if this is cost prohibitive.  Continue Toprol  same dose for now, labs as above, 1 month recheck.  RTC/ER precautions given.  Flu vaccine need - Plan: Flu vaccine trivalent PF, 6mos and older(Flulaval,Afluria,Fluarix,Fluzone)  - Concern discussed regarding flu vaccine.  He has not had a reaction to that vaccine just concerned that it may give him the flu.  Reviewed this concern and he did agree to have a flu vaccine in office today.  Meds ordered this encounter  Medications   hydrochlorothiazide  (HYDRODIURIL ) 25 MG tablet    Sig: Take 1 tablet (25 mg total) by mouth daily.    Dispense:  90 tablet    Refill:  1   metoprolol  succinate (TOPROL -XL) 100 MG 24 hr  tablet    Sig: Take 1 tablet (100 mg total) by mouth daily. Take with or immediately following a meal.    Dispense:  90 tablet    Refill:  1   amLODipine -olmesartan  (AZOR ) 10-40 MG tablet    Sig: Take 1 tablet by mouth daily.    Dispense:  90 tablet    Refill:  1   Patient Instructions  Thank you for coming in today.  I have adjusted your blood pressure medications to a combination of  olmesartan  and amlodipine  which should be much less expensive with a separate prescription for hydrochlorothiazide , also should be very inexpensive.  Continue same dose of Toprol .  Follow-up in 1 month and we can recheck your blood pressure at that time on this regimen.  If any chest pain, weakness, slurred speech, new headaches, or new side effects with this medication, be seen right away.  If there are any concerns on your bloodwork, I will let you know.  As we discussed you have the diagnosis of diabetes based on prior readings although it sounds like most recently that level has done well.  You were still at a diabetes range on your 31-month blood sugar last February, but goal is less than 7.0.  Depending on labs from today if you are still doing well we could consider monitoring blood sugar off the metformin  since you are taking it infrequently as it is.  Can discuss further at your follow-up visit in 1 month.  Flu vaccine given today.  Please let me know if there are any further questions and call if any concerns with regarding cost of medications.  Take care!     Signed,   Reyes Pines, MD [facility], [facility] 05/05/24 9:46 AM      [1]  Allergies Allergen Reactions   Chlorthalidone  Other (See Comments)    lightheaded   "

## 2024-05-05 NOTE — Patient Instructions (Signed)
 Thank you for coming in today.  I have adjusted your blood pressure medications to a combination of olmesartan  and amlodipine  which should be much less expensive with a separate prescription for hydrochlorothiazide , also should be very inexpensive.  Continue same dose of Toprol .  Follow-up in 1 month and we can recheck your blood pressure at that time on this regimen.  If any chest pain, weakness, slurred speech, new headaches, or new side effects with this medication, be seen right away.  If there are any concerns on your bloodwork, I will let you know.  As we discussed you have the diagnosis of diabetes based on prior readings although it sounds like most recently that level has done well.  You were still at a diabetes range on your 24-month blood sugar last February, but goal is less than 7.0.  Depending on labs from today if you are still doing well we could consider monitoring blood sugar off the metformin  since you are taking it infrequently as it is.  Can discuss further at your follow-up visit in 1 month.  Flu vaccine given today.  Please let me know if there are any further questions and call if any concerns with regarding cost of medications.  Take care!

## 2024-05-05 NOTE — Addendum Note (Signed)
 Addended by: Syler Norcia R on: 05/05/2024 11:15 AM   Modules accepted: Level of Service

## 2024-05-06 ENCOUNTER — Telehealth: Payer: Self-pay

## 2024-05-06 DIAGNOSIS — I10 Essential (primary) hypertension: Secondary | ICD-10-CM

## 2024-05-06 MED ORDER — AMLODIPINE BESYLATE 10 MG PO TABS: 10.0000 mg | ORAL_TABLET | Freq: Every day | ORAL | 1 refills | Status: AC

## 2024-05-06 NOTE — Telephone Encounter (Signed)
 I ordered just the amlodipine  10mg  for now.  That should be very inexpensive.  Have him try that along with HCTZ, check home blood pressure readings and if they still are above 140/90, we can then add olmesartan  separately or another ARB that may be less costly.  Have him give me an update on his blood pressures in the next week or 2.  Thanks

## 2024-05-06 NOTE — Addendum Note (Signed)
 Addended by: Dorrine Montone R on: 05/06/2024 08:26 PM   Modules accepted: Orders

## 2024-05-06 NOTE — Telephone Encounter (Signed)
 Copied from CRM [id number]. Topic: Clinical - Prescription Issue >> May 06, 2024  2:36 PM Berneda FALCON wrote: Reason for CRM: Patient states that the pharmacy told him there is a $300 copay for the amLODipine -olmesartan  (AZOR ) 10-40 MG tablet. He would like to know if there is something else he can get that is less expensive please.   Pharmacy: [facility] [address] [place] - [address] [place] [address] Phone: [phone]  Fax: [phone]   If you get his vm, please leave vm, he will be at work. Patient callback is [phone] (home) [phone] (work)

## 2024-05-07 ENCOUNTER — Ambulatory Visit: Payer: Self-pay | Admitting: Family Medicine

## 2024-05-07 NOTE — Progress Notes (Signed)
 Lab results have been discussed.   Verbalized understanding? Yes  Are there any questions? No

## 2024-05-07 NOTE — Telephone Encounter (Signed)
 Called patient and [facility]. If patient calls back please relay Dr. Valorie message:   I ordered just the amlodipine  10mg  for now.  That should be very inexpensive.  Have him try that along with HCTZ, check home blood pressure readings and if they still are above 140/90, we can then add olmesartan  separately or another ARB that may be less costly.  Have him give me an update on his blood pressures in the next week or 2.  Thanks

## 2024-06-10 ENCOUNTER — Ambulatory Visit: Admitting: Family Medicine
